# Patient Record
Sex: Female | Born: 1955 | Race: Black or African American | Hispanic: No | Marital: Single | Smoking: Never smoker
Health system: Southern US, Community
[De-identification: ages and names within clinical notes are randomized; demographics above are authoritative.]

## PROBLEM LIST (undated history)

## (undated) DIAGNOSIS — D649 Anemia, unspecified: Secondary | ICD-10-CM

## (undated) DIAGNOSIS — I10 Essential (primary) hypertension: Secondary | ICD-10-CM

## (undated) DIAGNOSIS — F419 Anxiety disorder, unspecified: Secondary | ICD-10-CM

## (undated) DIAGNOSIS — R002 Palpitations: Secondary | ICD-10-CM

## (undated) DIAGNOSIS — G43909 Migraine, unspecified, not intractable, without status migrainosus: Secondary | ICD-10-CM

## (undated) DIAGNOSIS — T7840XA Allergy, unspecified, initial encounter: Secondary | ICD-10-CM

## (undated) DIAGNOSIS — L309 Dermatitis, unspecified: Secondary | ICD-10-CM

## (undated) DIAGNOSIS — Z9289 Personal history of other medical treatment: Secondary | ICD-10-CM

## (undated) HISTORY — DX: Palpitations: R00.2

## (undated) HISTORY — DX: Allergy, unspecified, initial encounter: T78.40XA

## (undated) HISTORY — DX: Migraine, unspecified, not intractable, without status migrainosus: G43.909

## (undated) HISTORY — DX: Essential (primary) hypertension: I10

## (undated) HISTORY — PX: NASAL SINUS SURGERY: SHX719

## (undated) HISTORY — DX: Personal history of other medical treatment: Z92.89

## (undated) HISTORY — DX: Anemia, unspecified: D64.9

## (undated) HISTORY — DX: Anxiety disorder, unspecified: F41.9

## (undated) HISTORY — DX: Dermatitis, unspecified: L30.9

---

## 1978-04-18 HISTORY — PX: ABDOMINAL EXPLORATION SURGERY: SHX538

## 1997-12-11 ENCOUNTER — Other Ambulatory Visit: Admission: RE | Admit: 1997-12-11 | Discharge: 1997-12-11 | Payer: Self-pay | Admitting: Obstetrics

## 1997-12-26 ENCOUNTER — Ambulatory Visit (HOSPITAL_COMMUNITY): Admission: RE | Admit: 1997-12-26 | Discharge: 1997-12-26 | Payer: Self-pay | Admitting: Obstetrics

## 1999-11-05 ENCOUNTER — Encounter: Payer: Self-pay | Admitting: Cardiology

## 1999-11-05 ENCOUNTER — Encounter: Admission: RE | Admit: 1999-11-05 | Discharge: 1999-11-05 | Payer: Self-pay | Admitting: Cardiology

## 2000-03-30 ENCOUNTER — Encounter (INDEPENDENT_AMBULATORY_CARE_PROVIDER_SITE_OTHER): Payer: Self-pay | Admitting: *Deleted

## 2000-03-30 ENCOUNTER — Ambulatory Visit (HOSPITAL_BASED_OUTPATIENT_CLINIC_OR_DEPARTMENT_OTHER): Admission: RE | Admit: 2000-03-30 | Discharge: 2000-03-30 | Payer: Self-pay | Admitting: *Deleted

## 2002-07-24 ENCOUNTER — Encounter (INDEPENDENT_AMBULATORY_CARE_PROVIDER_SITE_OTHER): Payer: Self-pay | Admitting: *Deleted

## 2002-07-24 ENCOUNTER — Ambulatory Visit (HOSPITAL_COMMUNITY): Admission: RE | Admit: 2002-07-24 | Discharge: 2002-07-24 | Payer: Self-pay | Admitting: Obstetrics and Gynecology

## 2002-10-02 ENCOUNTER — Other Ambulatory Visit: Admission: RE | Admit: 2002-10-02 | Discharge: 2002-10-02 | Payer: Self-pay | Admitting: Obstetrics and Gynecology

## 2002-12-26 ENCOUNTER — Encounter: Admission: RE | Admit: 2002-12-26 | Discharge: 2002-12-26 | Payer: Self-pay | Admitting: Allergy and Immunology

## 2003-11-20 ENCOUNTER — Ambulatory Visit (HOSPITAL_COMMUNITY): Admission: RE | Admit: 2003-11-20 | Discharge: 2003-11-20 | Payer: Self-pay | Admitting: Obstetrics and Gynecology

## 2004-06-30 ENCOUNTER — Emergency Department (HOSPITAL_COMMUNITY): Admission: EM | Admit: 2004-06-30 | Discharge: 2004-07-01 | Payer: Self-pay | Admitting: Emergency Medicine

## 2005-03-28 ENCOUNTER — Encounter: Admission: RE | Admit: 2005-03-28 | Discharge: 2005-03-28 | Payer: Self-pay | Admitting: Emergency Medicine

## 2006-04-10 ENCOUNTER — Emergency Department (HOSPITAL_COMMUNITY): Admission: EM | Admit: 2006-04-10 | Discharge: 2006-04-10 | Payer: Self-pay | Admitting: Emergency Medicine

## 2006-11-29 ENCOUNTER — Encounter: Admission: RE | Admit: 2006-11-29 | Discharge: 2006-11-29 | Payer: Self-pay | Admitting: Emergency Medicine

## 2007-06-07 ENCOUNTER — Ambulatory Visit (HOSPITAL_BASED_OUTPATIENT_CLINIC_OR_DEPARTMENT_OTHER): Admission: RE | Admit: 2007-06-07 | Discharge: 2007-06-07 | Payer: Self-pay | Admitting: Cardiology

## 2007-06-16 ENCOUNTER — Ambulatory Visit: Payer: Self-pay | Admitting: Internal Medicine

## 2007-08-22 ENCOUNTER — Encounter: Admission: RE | Admit: 2007-08-22 | Discharge: 2007-08-23 | Payer: Self-pay | Admitting: Family Medicine

## 2007-08-22 ENCOUNTER — Ambulatory Visit: Payer: Self-pay | Admitting: Psychology

## 2007-12-10 IMAGING — CR DG CHEST 2V
2 series · 2 of 2 positions shown · non-contrast
Comparison: None.

CLINICAL DATA: Cough, weakness, and syncope.  
 CHEST - 2 VIEW: 
 PA and lateral chest - 04/10/06.

[view not recorded (1 of 2)]
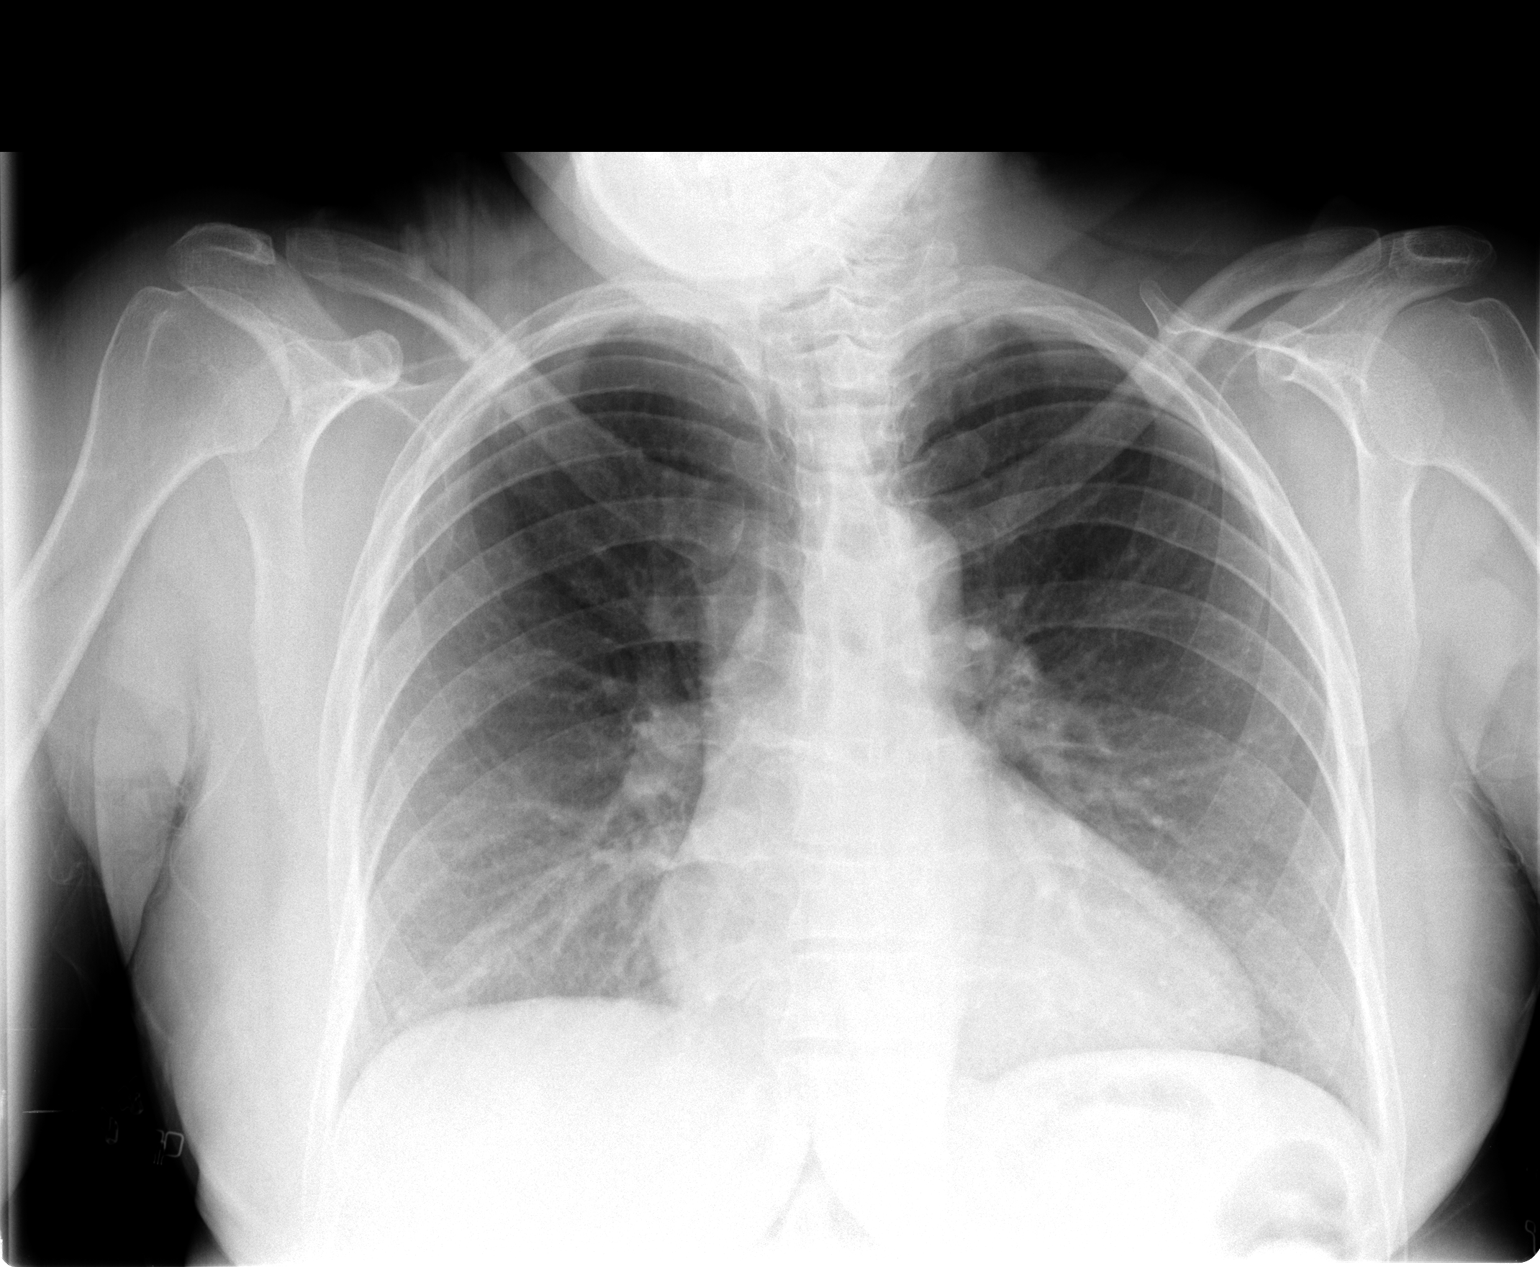

[view not recorded (2 of 2)]
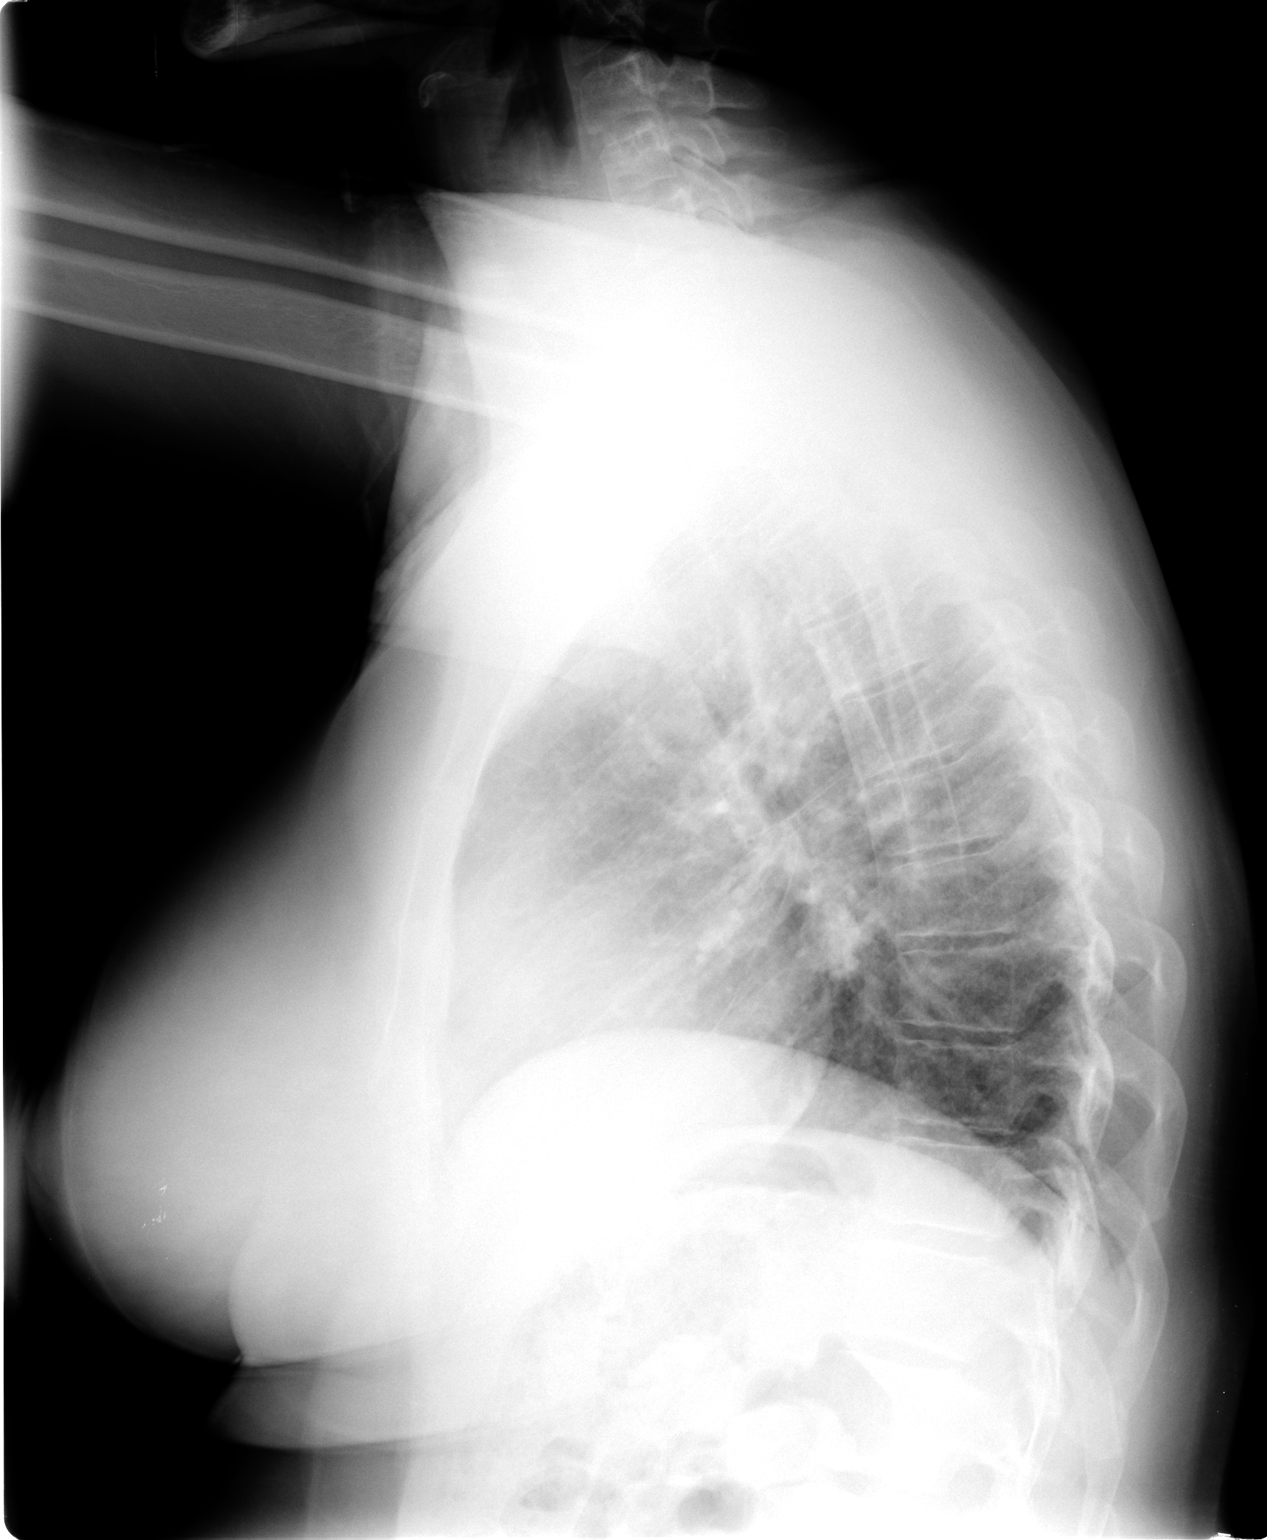

[2 of 2 positions shown; findings below may reference images not displayed]

FINDINGS: Lung volumes are low but the lungs are clear.  No effusion.  The heart size is upper normal.
IMPRESSION: No acute disease.

## 2009-05-13 ENCOUNTER — Encounter: Admission: RE | Admit: 2009-05-13 | Discharge: 2009-05-13 | Payer: Self-pay | Admitting: Obstetrics and Gynecology

## 2009-09-30 ENCOUNTER — Emergency Department (HOSPITAL_COMMUNITY): Admission: EM | Admit: 2009-09-30 | Discharge: 2009-09-30 | Payer: Self-pay | Admitting: Family Medicine

## 2010-04-18 HISTORY — PX: CARDIAC CATHETERIZATION: SHX172

## 2010-05-04 ENCOUNTER — Encounter: Admission: RE | Admit: 2010-05-04 | Payer: Self-pay | Source: Home / Self Care | Admitting: Cardiology

## 2010-05-07 ENCOUNTER — Ambulatory Visit (HOSPITAL_COMMUNITY)
Admission: RE | Admit: 2010-05-07 | Discharge: 2010-05-07 | Disposition: A | Discharge status: Still In Hospital | Payer: Self-pay | Source: Home / Self Care | Attending: Cardiology | Admitting: Cardiology

## 2010-05-09 ENCOUNTER — Encounter: Payer: Self-pay | Admitting: Obstetrics and Gynecology

## 2010-05-09 ENCOUNTER — Encounter: Payer: Self-pay | Admitting: Cardiology

## 2010-05-09 NOTE — Procedures (Signed)
Bianca [name], Bianca [name] NO.:  [id number]  MEDICAL RECORD NO.:  [id number]          PATIENT TYPE:  OIB  LOCATION:  6522                         FACILITY:  [facility]  PHYSICIAN:  Landry Corporal, MD DATE OF BIRTH:  1955-11-21  DATE OF PROCEDURE:  05/07/2010 DATE OF DISCHARGE:  05/07/2010                           CARDIAC CATHETERIZATION  PRIMARY CARE PROVIDER:  Dr. Ronne Binning  PERFORMING PHYSICIAN:  Landry Corporal, MD  PROCEDURE PERFORMED: 1. Left heart catheterization. 2. Left ventriculography in RAO Projection 3. Selective coronary angiography.  INDICATIONS: 1. Chest pain and shortness of breath. 2. Abnormal exercise treadmill stress test with diffuse ST depressions     at end-point of exercise.  [facility]  score estimated at negative 8 to     negative 12.  BRIEF HISTORY:  Ms. Sinning is a 55 year old African American woman with history of hypertension and anxiety who was referred for exercise treadmill stress test by Dr. Ronne Binning at [facility].  After reviewing the stress test, the patient was seen the same day in consultation based on the abnormal stress test.  After reviewing the stress test and the patient's symptoms, we discussed the risks, benefits, alternatives, and indications of additional stress test with imaging versus proceeding the cardiac catheterization.  After discussion, the patient was made to proceed with cardiac catheterization as this would be the definitive study.  The procedure with the risks, benefits, alternatives, and indications were discussed.  The potential complications were discussed.  All questions were answered.  The patient voiced understanding and agreed to proceed with the procedure.  PROCEDURE:  The patient was brought from the short stay area after premedication with prednisone and Benadryl.  She was brought to second floor of [facility] in a fasting state.   A time-out period was performed.  The plan was to go from left radial access and therefore a modified [name]'s/Barbeau test was performed on the left wrist, showed adequate lateral circulation to the left hand. The patient was fully draped with the left wrist prepped.  A time-out period was performed and then the patient was sedated with intravenous Versed and fentanyl.  The left wrist was anesthetized with 1% subcutaneous lidocaine and the left radial artery was accessed using via the Seldinger technique with placement of a 5-French sheath.  The sheath was then aspirated and flushed with total of 10 mL of standard radial cocktail infiltrated to the sheath.  Then first a 5-French JR-4 followed by 5-French JL-4 catheter were advanced over the wire and multiple angiographic views of the left and right coronary artery were obtained. The JL-3.5 catheter was exchanged for a 5-French pigtail catheter, which was then advanced across the aortic valve measuring the left ventricular hemodynamics.  A left ventriculogram was then performed in the RAO projection with 10 mL of contrast for 20 seconds.  After performing the left ventriculography, the catheter was pulled back across the aortic valve for measurement of the pullback gradient.  The catheter was then removed completely out of the body over a wire without any complications and the sheath was removed in the  cardiac catheterization lab with placement of a TR band with adequate hemostasis and assuring patent hemostasis.  The patient was then transferred to the holding area for ongoing care in a stable condition.  The patient was stable before, during, and after the procedure.  CATH LAB DATA: 1. Sedation:  1 mg of IV Versed, 25 mcg of IV fentanyl.  The patient     was also premedicated with 5 mg of p.o. Valium prior to the     procedure. 2. Contrast:  90 mL. 3. Radial cocktail 10 mL: 1% lidocaine, 400 mcg of nitroglycerin, and     5 mg of  verapamil. 4. 100 units of heparin bolus was administered intravenously at the     time of the radial access.  HEMODYNAMICS:  Left ventricular pressure is 110/12 mmHg.  LVEDP of 20 mmHg.  Aortic pressure is 109/66 mmHg with a mean of 86 mmHg.  ANGIOGRAPHIC FINDINGS: 1. The right coronary artery was a large-caliber vessel dominant     giving rise to a first large RV marginal branch and then bifurcates     distally to the PDA and posterolateral branch.  There is no     significant disease in these vessels.  A large-caliber vessel     almost down to the distal vessels. 2. The left main is a large-caliber vessel but bifurcation to LAD and     circumflex.  There is no significant disease in this vessel. 3. The LAD is a large-caliber vessel, which reaches down to around the     apex.  It gives rise to diagonal branches and the entire system has     no significant disease. 4. Circumflex artery is moderately large caliber vessel giving rise to     obtuse marginal branches and a small AV groove branch.  There is no     significant disease in this vessel either. 5. Left ventriculography showed hyperdynamic contractility with EF of     65-70% with no wall motion abnormalities.  There was no aortic     valve gradient on pullback.  Fluoroscopically, the heart borders     did appear somewhat thickened concerning for possible LVH.  IMPRESSION: 1. No significant coronary artery disease, likely nonanginal chest     pain with a false positive stress test. 2. Hyperdynamic left ventricle with slightly elevated LVEDP and wall     thickness suggestive of possible LVH.  RECOMMENDATIONS:  The patient will likely need an outpatient echocardiogram and follow up with me in roughly 1 weeks' time. Otherwise, he will have a standard postradial care and likely will be discharged later on the day.  Continue her other home medications.  The results will be discussed with the patient's primary care provider when  able to be contacted via telephone.          ______________________________ Landry Corporal, MD     DWH/MEDQ  D:  05/07/2010  T:  05/08/2010  Job:  [id number]  cc:   Second Floor [facility]; Dr. Ronne Binning; [facility]  Electronically Signed by Bryan Lemma MD on 05/09/2010 07:40:58 PM

## 2010-08-31 NOTE — Procedures (Signed)
Bianca [name], Bianca [name]               ACCOUNT NO.:  [id number]   MEDICAL RECORD NO.:  [id number]          PATIENT TYPE:  OUT   LOCATION:  SLEEP CENTER                 FACILITY:  [facility]   PHYSICIAN:  Clinton D. Maple Hudson, MD, FCCP, FACPDATE OF BIRTH:  03/30/[birth year]   DATE OF STUDY:  06/07/2007                            NOCTURNAL POLYSOMNOGRAM   REFERRING PHYSICIAN:  Osvaldo Shipper. Spruill, M.D.   REFERRING PHYSICIAN:  Osvaldo Shipper. Spruill, M.D.   INDICATION FOR STUDY:  Insomnia with sleep apnea.   EPWORTH SLEEPINESS SCORE:  17/24.  BMI 28.7.  Weight 157 pounds.  Height  62 inches.  Neck 13 inches.   HOME MEDICATIONS:  Charted and reviewed.   SLEEP ARCHITECTURE:  Total sleep time 339 minutes with sleep efficiency  81.8%.  Stage 1 is 4.6%, stage 2 67.6%, stage 3 5.2%, REM 22.7% of total  sleep time.  Sleep latency 16.5 minutes, REM latency 108.5 minutes.  Arousal awake after sleep onset 63 minutes.  Arousal index 13.3.  Bedtime medication included clonidine and triamterine/HCTZ.   RESPIRATORY DATA:  Apnea hypopnea index (AHI) of 0.5 events per hour  which is normal.  Respiratory disturbance index (RDI) was also normal at  3.4.  Normal range 0-5.  There were a total of 3 respiratory events, all  hypopneas recorded while sleeping on sides.   OXYGEN DATA:  Mild snoring with oxygen desaturation to a nadir of 91%.  Mean oxygen through the study was 97.7% on room air.   CARDIAC DATA:  Normal sinus rhythm.   MOVEMENT-PARASOMNIA:  No movement disturbances.  Bathroom x1.   IMPRESSIONS-RECOMMENDATIONS:  1. Sleep architecture not remarkable for sleep center environment with      a few transient awakenings but nothing specific.  2. No significant respiratory disturbance, AHI 0.5 per hour with a few      hypopneas recorded while lying on sides.  AHI 0.5 per hour (normal      range 0-5 per hour).      Clinton D. Maple Hudson, MD, [facility], FACP  Diplomate, Biomedical engineer of Sleep Medicine  Electronically  Signed     CDY/MEDQ  D:  06/16/2007 15:21:49  T:  06/17/2007 16:19:13  Job:  [id number]

## 2010-09-03 NOTE — Op Note (Signed)
NAME:  Bianca [name], Bianca [name]                         ACCOUNT NO.:  [id number]   MEDICAL RECORD NO.:  [id number]                   PATIENT TYPE:  AMB   LOCATION:  SDC                                  FACILITY:  [facility]   PHYSICIAN:  Sherry A. Rosalio Macadamia, M.D.           DATE OF BIRTH:  05/12/1955   DATE OF PROCEDURE:  07/24/2002  DATE OF DISCHARGE:                                 OPERATIVE REPORT   PREOPERATIVE DIAGNOSES:  1. Left Skene's abscess.  2. Right thigh nodule.   POSTOPERATIVE DIAGNOSES:  1. Left Skene's abscess.  2. Right thigh nodule.   PROCEDURES:  1. Excision, right thigh nodule.  2. Marsupialization, left Skene's gland.   SURGEON:  Sherry A. Rosalio Macadamia, M.D.   ANESTHESIA:  Spinal.   INDICATIONS FOR PROCEDURE:  This is a 55 year old G0, P0, woman who has  noted enlargement of the left labia that has been present for 20 years but  has gotten significantly bigger recently.  This caused significant  discomfort.  She had no drainage from this area.  The patient was also  complaining of an enlarging right thigh mole which she would like to have  excised.  Because of these two problems, the patient is brought to the  operating room for drainage of the left Skene's gland abscess and  marsupialization and excision of thigh nodule.   FINDINGS:  A 1-cm right thigh mole.  A 2-4 cm left Skene's gland abscess.   PROCEDURE:  The patient was brought into the operating room, given adequate  spinal anesthesia.  She was placed in dorsal lithotomy position.  Her  perineum, thigh, and vagina were washed with Hibiclens.  The patient was  draped in a sterile fashion.  The base of the right thigh mole was  infiltrated with 0.5% Marcaine with epinephrine.  The mole was excised.  Some little deeper tissue was removed to be able to close the skin properly.  Bleeders were cauterized.  Interrupted subcutaneous stitches were taken with  3-0 chromic to bring skin edges together.  Skin edges were  then closed with  4-0 Monocryl in a running subcuticular stitch.  Attention was then turned to  the [facility] gland.   The superficial labial skin was infiltrated with 0.5% Marcaine with  epinephrine.  An elliptical incision was made.  The exterior tissue was  excised.  The abscess was incised in the midline.  Drainage was obtained.  Large amount of thick, brown material was removed.  Edges of the incision  were cauterized using 3-0 chromic in interrupted stitches.  The deep tissue  was closed to the outer mucosal edge.  There was a small bleeder at the base  of this incision.  A mattress-type stitch was taken with 3-0 chromic with  adequate hemostasis present.  The edges were then reinfiltrated  with 0.5% Marcaine with epinephrine.  Adequate hemostasis was still present.  The patient was washed off.  The patient was taken out of the dorsal  lithotomy position, she was awakened, she was moved from the operating table  to a stretcher in stable condition.  Complications were none.  Estimated  blood loss 5 mL.                                               Sherry A. Rosalio Macadamia, M.D.    SAD/MEDQ  D:  07/24/2002  T:  07/24/2002  Job:  [id number]

## 2010-09-03 NOTE — Op Note (Signed)
[place]  Patient:    Bianca [name], Bianca [name]                      MRN: [id number] Proc. Date: 03/30/00 Adm. Date:  [id number] Attending:  Claudina Lick                           Operative Report  PREOPERATIVE DIAGNOSES: 1. Bilateral recurrent sinusitis. 2. Deviated nasal septum. 3. Nasal turbinate hypertrophy.  POSTOPERATIVE DIAGNOSES: 1. Bilateral recurrent sinusitis. 2. Deviated nasal septum. 3. Nasal turbinate hypertrophy.  OPERATION: 1. Bilateral endoscopic anterior ethmoidectomy. 2. Bilateral endoscopic maxillary antrostomies. 3. Nasal septoplasty. 4. Submucous resection right inferior nasal turbinate.  SURGEON:  Robert L. Lyman Bishop, M.D.  ANESTHESIA:  General.  INDICATION FOR PROCEDURE:  This 55 year old black female has had a chronic recurring history of sinusitis, multiple episodes treated by her internist and allergist with multiple courses of antibiotics and steroid nasal inhalers. The patient has had persistent nasal blockage, headaches and an examination had a septal deviation particularly superiorly more marked to the left and large right middle and inferior turbinates.  CT scan of sinuses within normal limits. The patient admitted for surgery.  DESCRIPTION OF PROCEDURE:  After satisfactory general endotracheal anesthesia had been induced, topical epinephrine packs were placed intranasally after which the nasal septum, both inferior and middle turbinates and the anterior lateral nasal wall were infiltrated with 1% xylocaine containing 1:100,000 epinephrine for hemostasis using a total of 9 cc.  The nose and face were then prepped with Betadine and sterile drapes applied.  A left anterior septal incision was made.  The mucoperichondrium and periosteum elevated on the left side.  The quadrangular cartilage was then separated from the perpendicular plate and the mucoperiosteal flap on the right side was then elevated.   The deviated portion of the perpendicular plate superiorly along with a small volmerine spur inferiorly was then removed preserving the guadrangular cartilage.  This corrected the high septal deviation and opened up the superior nasal vault on each side.  The posterior septal flaps were then reapproximated with a mattress suture of 5-0 Vicryl and the septal incision was closed with running 5-0 chromic gut.  A small incision was made over the anterior end of the right inferior nasal turbinate.  The mucoperiosteum elevated.  Some excess turbinate bone removed and the incision closed with running 5-0 chromic gut.  Incision was made along the uncinate process with a sickle knife on each side and using the 0 degree endoscope and the microdebrider an anterior ethmoidectomy on each side was carried out removing some thickened but not polypoid mucosa and a large maxillary antrostomy on each side was then created.  The maxillary sinus mucosa on each side is visualized through the enlarged antrostomy was somewhat thickened but no polyps were present.  A small strip of adaptic gauze impregnated with Cortisporin ointment was placed in each ethmoid cavity and each side of the nose was then packed with a folded strip of Telfa gauze coated with Cortisporin ointment.  Estimated blood loss was less than 10 cc.  The patient tolerated the procedure well and was given 1 gm of Ancef IV intraoperatively. She was awakened from anesthesia and taken to the recovery room in satisfactory condition. DD:  03/30/00 TD:  03/30/00 Job: [id number]

## 2010-09-06 ENCOUNTER — Other Ambulatory Visit: Payer: Self-pay | Admitting: Obstetrics and Gynecology

## 2010-12-21 ENCOUNTER — Other Ambulatory Visit: Payer: Self-pay | Admitting: Obstetrics and Gynecology

## 2010-12-21 DIAGNOSIS — Z1231 Encounter for screening mammogram for malignant neoplasm of breast: Secondary | ICD-10-CM

## 2010-12-22 ENCOUNTER — Ambulatory Visit
Admission: RE | Admit: 2010-12-22 | Discharge: 2010-12-22 | Disposition: A | Discharge status: Home / Self Care | Payer: BC Managed Care – PPO | Source: Ambulatory Visit | Attending: Obstetrics and Gynecology | Admitting: Obstetrics and Gynecology

## 2010-12-22 DIAGNOSIS — Z1231 Encounter for screening mammogram for malignant neoplasm of breast: Secondary | ICD-10-CM

## 2010-12-24 ENCOUNTER — Other Ambulatory Visit: Payer: Self-pay | Admitting: Obstetrics and Gynecology

## 2010-12-24 DIAGNOSIS — N644 Mastodynia: Secondary | ICD-10-CM

## 2011-01-03 ENCOUNTER — Other Ambulatory Visit: Payer: Self-pay | Admitting: Obstetrics and Gynecology

## 2011-01-03 ENCOUNTER — Ambulatory Visit
Admission: RE | Admit: 2011-01-03 | Discharge: 2011-01-03 | Disposition: A | Discharge status: Home / Self Care | Payer: BC Managed Care – PPO | Source: Ambulatory Visit | Attending: Obstetrics and Gynecology | Admitting: Obstetrics and Gynecology

## 2011-01-03 DIAGNOSIS — N644 Mastodynia: Secondary | ICD-10-CM

## 2011-03-15 ENCOUNTER — Other Ambulatory Visit: Payer: Self-pay | Admitting: Obstetrics and Gynecology

## 2011-03-15 DIAGNOSIS — N644 Mastodynia: Secondary | ICD-10-CM

## 2011-04-02 ENCOUNTER — Ambulatory Visit (INDEPENDENT_AMBULATORY_CARE_PROVIDER_SITE_OTHER): Payer: BC Managed Care – PPO

## 2011-04-02 DIAGNOSIS — H1030 Unspecified acute conjunctivitis, unspecified eye: Secondary | ICD-10-CM

## 2011-04-02 DIAGNOSIS — H1130 Conjunctival hemorrhage, unspecified eye: Secondary | ICD-10-CM

## 2011-04-09 ENCOUNTER — Ambulatory Visit (INDEPENDENT_AMBULATORY_CARE_PROVIDER_SITE_OTHER): Payer: BC Managed Care – PPO

## 2011-04-09 DIAGNOSIS — J019 Acute sinusitis, unspecified: Secondary | ICD-10-CM

## 2011-04-09 DIAGNOSIS — J189 Pneumonia, unspecified organism: Secondary | ICD-10-CM

## 2011-06-24 ENCOUNTER — Ambulatory Visit: Payer: BC Managed Care – PPO

## 2011-06-24 ENCOUNTER — Ambulatory Visit (INDEPENDENT_AMBULATORY_CARE_PROVIDER_SITE_OTHER): Payer: BC Managed Care – PPO | Admitting: Family Medicine

## 2011-06-24 VITALS — BP 130/70 | HR 60 | Temp 97.7°F | Resp 18 | Ht 61.0 in | Wt 161.0 lb

## 2011-06-24 DIAGNOSIS — M79669 Pain in unspecified lower leg: Secondary | ICD-10-CM

## 2011-06-24 DIAGNOSIS — R252 Cramp and spasm: Secondary | ICD-10-CM

## 2011-06-24 NOTE — Progress Notes (Signed)
[facility]:  Office Visit  Chief Complaint:  Chief Complaint  Patient presents with  . Leg Pain    left  . pressure in left arm    HPI: Bianca [name] is a 56 y.o. female who complains of  Left leg cramp x today after taking Excedrin migraine. Described as "charley horse like" , constant, worsens with certain movement ie extension.  Cramps  Noted in her left posterior leg, behind knee. NO radiation. Unable to extend as well, some discomfort with flexion. Denies SOB, swelling, redness, signs of infection, warmth, fever, chills. Denies trauma or infection.   Denies risk factors for DVT: no recent travels, surgeries, traumas, OCP use, prior DVT/blood clot disorders, malignancies.  She is on HCTZ for HTN. H/o anemia.   Past Medical History  Diagnosis Date  . Hypertension   . Migraines   . Allergy   . Anemia   . Asthma   . Anxiety   . Palpitations    History reviewed. No pertinent past surgical history. History   Social History  . Marital Status: Single    Spouse Name: N/A    Number of Children: N/A  . Years of Education: N/A   Social History Main Topics  . Smoking status: Never Smoker   . Smokeless tobacco: None  . Alcohol Use: No  . Drug Use: No  . Sexually Active: None   Other Topics Concern  . None   Social History Narrative  . None   Family History  Problem Relation Age of Onset  . Hypertension Mother   . Hypertension Father   . Stroke Maternal Grandmother    Allergies  Allergen Reactions  . Amoxicillin   . Sulfur    Prior to Admission medications   Medication Sig Start Date End Date Taking? Authorizing Provider  metoprolol succinate (TOPROL-XL) 50 MG 24 hr tablet Take 5 mg by mouth daily. Take with or immediately following a meal.   Yes Historical Provider, MD  triamterene-hydrochlorothiazide (MAXZIDE-25) 37.5-25 MG per tablet Take 1 tablet by mouth daily.   Yes Historical Provider, MD     ROS: The patient denies fevers,  chills, night sweats, unintentional weight loss, chest pain, palpitations, wheezing, dyspnea on exertion, nausea, vomiting, abdominal pain, dysuria, hematuria, melena, numbness, weakness, or tingling.   All other systems have been reviewed and were otherwise negative with the exception of those mentioned in the HPI and as above.    PHYSICAL EXAM: Filed Vitals:   06/24/11 1632  BP: 130/70  Pulse: 60  Temp: 97.7 F (36.5 C)  Resp: 18   Filed Vitals:   06/24/11 1632  Height: 5\' 1"  (1.549 m)  Weight: 161 lb (73.029 kg)   Body mass index is 30.42 kg/(m^2).  General: Alert, no acute distress HEENT:  Normocephalic, atraumatic, oropharynx patent.  Cardiovascular:  Regular rate and rhythm, no rubs murmurs or gallops.  No Carotid bruits, radial pulse intact. No pedal edema.  Respiratory: Clear to auscultation bilaterally.  No wheezes, rales, or rhonchi.  No cyanosis, no use of accessory musculature GI: No organomegaly, abdomen is soft and non-tender, positive bowel sounds.  No masses. Skin: No rashes. Neurologic: Facial musculature symmetric. Psychiatric: Patient is appropriate throughout our interaction. Lymphatic: No cervical lymphadenopathy Musculoskeletal: Gait left limp Hips: normal Left knee: + mild swelling posterior knee;  negative Lachman, McMurrays/jt line tenderness, MCl, LCL tenderness/pain; patient has minimal decrease extension of knee. And minimal decrease in flexion. NO calf swelling, no varicose veins.  Dorsalis and post tib pulses +2 5/5 strength, sensation intact, able to dorsi and plantar flex foot.   LABS: No results found for this or any previous visit.   EKG/XRAY:   Primary read interpreted by Dr. Conley Rolls at  [facility]. Left posterior knee effusion.    ASSESSMENT/PLAN: Encounter Diagnoses  Name Primary?  . Calf pain Yes  . Leg cramps    1. Xray shows posterior knee effusion-? Etiology? No trauma or s/sx of infection. Will monitor, sxs treatment, NSAIDs and warm  compresses. Elevate. 2. Check BMP for ? Electrolyte issues.  3. F/u prn.    Tammye Kahler PHUONG, DO 06/24/2011 7:02 PM

## 2011-06-25 LAB — BASIC METABOLIC PANEL
Calcium: 10 mg/dL (ref 8.4–10.5)
Potassium: 4.1 mEq/L (ref 3.5–5.3)
Sodium: 144 mEq/L (ref 135–145)

## 2011-06-25 LAB — BASIC METABOLIC PANEL WITH GFR
BUN: 9 mg/dL (ref 6–23)
CO2: 31 meq/L (ref 19–32)
Chloride: 104 meq/L (ref 96–112)
Creat: 0.71 mg/dL (ref 0.50–1.10)
Glucose, Bld: 78 mg/dL (ref 70–99)

## 2011-06-30 ENCOUNTER — Telehealth: Payer: Self-pay | Admitting: Family Medicine

## 2011-06-30 NOTE — Telephone Encounter (Signed)
Lm regarding xray results and also labs. Minimal DJD of knee and also normal BMP. F/u prn

## 2011-11-30 DIAGNOSIS — R002 Palpitations: Secondary | ICD-10-CM | POA: Insufficient documentation

## 2011-12-24 ENCOUNTER — Ambulatory Visit (INDEPENDENT_AMBULATORY_CARE_PROVIDER_SITE_OTHER): Payer: BC Managed Care – PPO | Admitting: Family Medicine

## 2011-12-24 VITALS — BP 103/71 | HR 82 | Temp 98.4°F | Resp 16 | Ht 62.0 in | Wt 150.0 lb

## 2011-12-24 DIAGNOSIS — I10 Essential (primary) hypertension: Secondary | ICD-10-CM

## 2011-12-24 DIAGNOSIS — R002 Palpitations: Secondary | ICD-10-CM

## 2011-12-24 DIAGNOSIS — R079 Chest pain, unspecified: Secondary | ICD-10-CM

## 2011-12-24 DIAGNOSIS — F411 Generalized anxiety disorder: Secondary | ICD-10-CM

## 2011-12-24 DIAGNOSIS — F419 Anxiety disorder, unspecified: Secondary | ICD-10-CM

## 2011-12-24 LAB — POCT CBC
Lymph, poc: 2.8 (ref 0.6–3.4)
MCH, POC: 28.1 pg (ref 27–31.2)
MCHC: 31.2 g/dL — AB (ref 31.8–35.4)
MCV: 90.2 fL (ref 80–97)
MID (cbc): 0.6 (ref 0–0.9)
POC LYMPH PERCENT: 30.9 %L (ref 10–50)
Platelet Count, POC: 419 10*3/uL (ref 142–424)
RBC: 4.69 M/uL (ref 4.04–5.48)
WBC: 8.9 10*3/uL (ref 4.6–10.2)

## 2011-12-24 LAB — COMPREHENSIVE METABOLIC PANEL
AST: 25 U/L (ref 0–37)
BUN: 10 mg/dL (ref 6–23)
Calcium: 10.6 mg/dL — ABNORMAL HIGH (ref 8.4–10.5)
Chloride: 102 mEq/L (ref 96–112)
Creat: 0.83 mg/dL (ref 0.50–1.10)
Total Bilirubin: 1.1 mg/dL (ref 0.3–1.2)

## 2011-12-24 LAB — TSH: TSH: 0.52 u[IU]/mL (ref 0.350–4.500)

## 2011-12-24 MED ORDER — LORAZEPAM 1 MG PO TABS: ORAL_TABLET | ORAL | Status: DC

## 2011-12-24 MED ORDER — BUPROPION HCL ER (XL) 150 MG PO TB24: ORAL_TABLET | ORAL | Status: DC

## 2011-12-24 NOTE — Progress Notes (Signed)
Subjective: 56 year old female who is here complaining of headache, palpitations, chest pain. She is a Chartered loss adjuster. She has a prior history of palpitations and chest pains. Has been evaluated by a local cardiologist, Dr. Herbie Baltimore, who even did a heart catheterization on her last year. Her heart cath was cleaned. She does have a family history of heart rhythm problems in the family, with her brother having to be cardioverted for atrial fibrillation it sounds like. Since school started back over last week she's had a lot more palpitations. She has pains going into her left arm. She has headaches. She does not describe her sinuses being a big concern even though that was in the visit info note. 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 There have been administrative changes also which are on her. She is single and lives alone she has not been getting her regular size of late, though she used to exercise regularly on a treadmill. She takes Phillips. Was on one half of a pill daily but he after that last visit to a whole pill daily her pulse is up these days, though at times she gets pulses down into the 50s.  Complain more of her sinus on the way out. Mostly her symptoms are a frontal headache from that.  Objective: Anxious appearing lady who otherwise looks healthy. TMs normal. Throat clear. Neck supple without nodes. Chest clear. Heart regular without murmurs. No ectopy were noted when I was examining her. Abdomen nontender.  Assessment: Palpitations Chest pain to left arm Headache Anxiety  Plan: EKG CBC, complete metabolic panel, TSH   EKG no ectopy.  Right anterior hemiblock, nonspecific  Results for orders placed in visit on 12/24/11  POCT CBC      Component Value Range   WBC 8.9  4.6 - 10.2 K/uL   Lymph, poc 2.8  0.6 - 3.4   POC LYMPH PERCENT 30.9  10 - 50 %L   MID (cbc) 0.6  0 - 0.9   POC MID % 6.5  0 - 12 %M   POC Granulocyte 5.6  2 -  6.9   Granulocyte percent 62.6  37 - 80 %G   RBC 4.69  4.04 - 5.48 M/uL   Hemoglobin 13.2  12.2 - 16.2 g/dL   HCT, POC 96.0  45.4 - 47.9 %   MCV 90.2  80 - 97 fL   MCH, POC 28.1  27 - 31.2 pg   MCHC 31.2 (*) 31.8 - 35.4 g/dL   RDW, POC 09.8     Platelet Count, POC 419  142 - 424 K/uL   MPV 7.1  0 - 99.8 fL    Recommend counselling, medication, exercise.  Discouraged leave of absence yet.  She has a sinus nose spray at home I told her to use that and take Claritin.

## 2011-12-24 NOTE — Patient Instructions (Addendum)
Suggested psychologists/counsellors:  Karmen Bongo  [phone]  GNFAOZ HYQMVHQ et al [phone],  [phone]  Exercise  See your PCP  Welbutrin one daily  Lorazepram 1 mg Take 1/2 to 1 maximum twice daily for anxiety only as needed  Increase the metoprolol to 50 mg in AM and 25 mg (1/2 of 50) in PM

## 2011-12-26 ENCOUNTER — Encounter: Payer: Self-pay | Admitting: *Deleted

## 2012-05-25 ENCOUNTER — Other Ambulatory Visit: Payer: Self-pay | Admitting: Otolaryngology

## 2012-05-25 DIAGNOSIS — Z8489 Family history of other specified conditions: Secondary | ICD-10-CM

## 2012-05-25 DIAGNOSIS — G43909 Migraine, unspecified, not intractable, without status migrainosus: Secondary | ICD-10-CM

## 2012-05-28 ENCOUNTER — Ambulatory Visit
Admission: RE | Admit: 2012-05-28 | Discharge: 2012-05-28 | Disposition: A | Discharge status: Home / Self Care | Payer: BC Managed Care – PPO | Source: Ambulatory Visit | Attending: Otolaryngology | Admitting: Otolaryngology

## 2012-05-28 DIAGNOSIS — G43909 Migraine, unspecified, not intractable, without status migrainosus: Secondary | ICD-10-CM

## 2012-05-28 DIAGNOSIS — Z8489 Family history of other specified conditions: Secondary | ICD-10-CM

## 2012-05-28 MED ORDER — GADOBENATE DIMEGLUMINE 529 MG/ML IV SOLN
14.0000 mL | Freq: Once | INTRAVENOUS | Status: AC | PRN
  Administered 2012-05-28: 14 mL via INTRAVENOUS

## 2012-06-13 ENCOUNTER — Other Ambulatory Visit (HOSPITAL_COMMUNITY): Payer: Self-pay | Admitting: Cardiology

## 2012-06-13 DIAGNOSIS — R079 Chest pain, unspecified: Secondary | ICD-10-CM

## 2012-06-13 DIAGNOSIS — I10 Essential (primary) hypertension: Secondary | ICD-10-CM

## 2012-06-13 DIAGNOSIS — R001 Bradycardia, unspecified: Secondary | ICD-10-CM

## 2012-06-13 DIAGNOSIS — R42 Dizziness and giddiness: Secondary | ICD-10-CM

## 2012-07-05 ENCOUNTER — Encounter (HOSPITAL_COMMUNITY): Payer: BC Managed Care – PPO

## 2012-07-18 ENCOUNTER — Ambulatory Visit (HOSPITAL_COMMUNITY)
Admission: RE | Admit: 2012-07-18 | Discharge: 2012-07-18 | Disposition: A | Discharge status: Home / Self Care | Payer: BC Managed Care – PPO | Source: Ambulatory Visit | Attending: Cardiology | Admitting: Cardiology

## 2012-07-18 DIAGNOSIS — R42 Dizziness and giddiness: Secondary | ICD-10-CM | POA: Insufficient documentation

## 2012-07-18 DIAGNOSIS — I498 Other specified cardiac arrhythmias: Secondary | ICD-10-CM | POA: Insufficient documentation

## 2012-07-18 DIAGNOSIS — R079 Chest pain, unspecified: Secondary | ICD-10-CM | POA: Insufficient documentation

## 2012-07-18 DIAGNOSIS — R001 Bradycardia, unspecified: Secondary | ICD-10-CM

## 2012-07-18 DIAGNOSIS — I10 Essential (primary) hypertension: Secondary | ICD-10-CM | POA: Insufficient documentation

## 2012-07-18 NOTE — Progress Notes (Signed)
Carotid Duplex Completed. Bianca [name]  

## 2012-07-31 ENCOUNTER — Other Ambulatory Visit: Payer: Self-pay | Admitting: Internal Medicine

## 2012-07-31 DIAGNOSIS — E041 Nontoxic single thyroid nodule: Secondary | ICD-10-CM

## 2012-08-02 ENCOUNTER — Ambulatory Visit (HOSPITAL_COMMUNITY)
Admission: RE | Admit: 2012-08-02 | Discharge: 2012-08-02 | Disposition: A | Discharge status: Home / Self Care | Payer: BC Managed Care – PPO | Source: Ambulatory Visit | Attending: Internal Medicine | Admitting: Internal Medicine

## 2012-08-02 DIAGNOSIS — E041 Nontoxic single thyroid nodule: Secondary | ICD-10-CM | POA: Insufficient documentation

## 2012-08-08 ENCOUNTER — Other Ambulatory Visit: Payer: Self-pay | Admitting: Internal Medicine

## 2012-08-08 ENCOUNTER — Other Ambulatory Visit (HOSPITAL_COMMUNITY)
Admission: RE | Admit: 2012-08-08 | Discharge: 2012-08-08 | Disposition: A | Discharge status: Home / Self Care | Payer: BC Managed Care – PPO | Source: Ambulatory Visit | Attending: Interventional Radiology | Admitting: Interventional Radiology

## 2012-08-08 ENCOUNTER — Ambulatory Visit
Admission: RE | Admit: 2012-08-08 | Discharge: 2012-08-08 | Disposition: A | Discharge status: Home / Self Care | Payer: BC Managed Care – PPO | Source: Ambulatory Visit | Attending: Internal Medicine | Admitting: Internal Medicine

## 2012-08-08 DIAGNOSIS — E041 Nontoxic single thyroid nodule: Secondary | ICD-10-CM

## 2012-08-08 DIAGNOSIS — E049 Nontoxic goiter, unspecified: Secondary | ICD-10-CM | POA: Insufficient documentation

## 2012-09-05 ENCOUNTER — Other Ambulatory Visit: Payer: Self-pay | Admitting: Endocrinology

## 2012-09-05 DIAGNOSIS — E049 Nontoxic goiter, unspecified: Secondary | ICD-10-CM

## 2012-10-26 ENCOUNTER — Encounter: Payer: Self-pay | Admitting: *Deleted

## 2012-10-29 ENCOUNTER — Encounter: Payer: Self-pay | Admitting: Cardiology

## 2012-10-30 ENCOUNTER — Ambulatory Visit: Payer: BC Managed Care – PPO | Admitting: Cardiology

## 2013-01-17 ENCOUNTER — Other Ambulatory Visit (HOSPITAL_COMMUNITY): Payer: Self-pay | Admitting: Obstetrics and Gynecology

## 2013-01-17 DIAGNOSIS — Z1231 Encounter for screening mammogram for malignant neoplasm of breast: Secondary | ICD-10-CM

## 2013-01-18 ENCOUNTER — Ambulatory Visit (HOSPITAL_COMMUNITY)
Admission: RE | Admit: 2013-01-18 | Discharge: 2013-01-18 | Disposition: A | Discharge status: Home / Self Care | Payer: BC Managed Care – PPO | Source: Ambulatory Visit | Attending: Obstetrics and Gynecology | Admitting: Obstetrics and Gynecology

## 2013-01-18 DIAGNOSIS — Z1231 Encounter for screening mammogram for malignant neoplasm of breast: Secondary | ICD-10-CM | POA: Insufficient documentation

## 2013-02-14 ENCOUNTER — Ambulatory Visit (HOSPITAL_COMMUNITY)
Admission: RE | Admit: 2013-02-14 | Discharge: 2013-02-14 | Disposition: A | Discharge status: Home / Self Care | Payer: BC Managed Care – PPO | Source: Ambulatory Visit | Attending: Cardiovascular Disease | Admitting: Cardiovascular Disease

## 2013-02-14 DIAGNOSIS — I10 Essential (primary) hypertension: Secondary | ICD-10-CM

## 2013-02-14 DIAGNOSIS — R002 Palpitations: Secondary | ICD-10-CM

## 2013-02-22 ENCOUNTER — Encounter: Payer: Self-pay | Admitting: Cardiology

## 2013-02-22 ENCOUNTER — Ambulatory Visit: Payer: BC Managed Care – PPO | Admitting: Cardiology

## 2013-02-22 ENCOUNTER — Ambulatory Visit (INDEPENDENT_AMBULATORY_CARE_PROVIDER_SITE_OTHER): Payer: BC Managed Care – PPO | Admitting: Cardiology

## 2013-02-22 VITALS — BP 120/84 | HR 72 | Ht 61.5 in | Wt 160.8 lb

## 2013-02-22 DIAGNOSIS — R9439 Abnormal result of other cardiovascular function study: Secondary | ICD-10-CM

## 2013-02-22 DIAGNOSIS — R002 Palpitations: Secondary | ICD-10-CM

## 2013-02-22 DIAGNOSIS — I10 Essential (primary) hypertension: Secondary | ICD-10-CM

## 2013-02-22 NOTE — Patient Instructions (Signed)
Your physician has requested that you have en exercise stress myoview. For further information please visit [email or web address] Please follow instruction sheet, as given.   Your physician wants you to follow-up in 12 months Dr Herbie Baltimore if EXERCISE MYOVIEW is abnormal we will see you at that time.  You will receive a reminder letter in the mail two months in advance. If you don't receive a letter, please call our office to schedule the follow-up appointment.

## 2013-02-24 ENCOUNTER — Encounter: Payer: Self-pay | Admitting: Cardiology

## 2013-02-24 DIAGNOSIS — R9439 Abnormal result of other cardiovascular function study: Secondary | ICD-10-CM | POA: Insufficient documentation

## 2013-02-24 NOTE — Progress Notes (Signed)
PATIENT: Bianca [name] MRN: [id number]  DOB: [birth year]   DOV:02/24/2013 PCP: Thayer Headings, MD  Clinic Note: Chief Complaint  Patient presents with  . Follow-up    Discuss tests results. Pt reports dizziness, back pain, left leg pain. DOE. Denies c/p.    HPI: Bianca [name] is a 57 y.o. female with a PMH below who presents today for reconsultation for abnormal treadmill stress test.  Interval History: Bianca [name] is well-known to me, she has had a long-standing history of intermittent chest pain episodes. We actually evaluated this with a cardiac catheterization in January 2000 while which showed no significant coronary disease.  She is definitely had some musculoskeletal chest discomfort. She doesn't palpitations with intermittent PACs and a short pager on a monitor in the past. I last saw her in April of this year. She's doing relatively well. She still noted below but exertional dyspnea and off-and-on chest discomfort.  Because of her persistent chest discomfort, she was referred for a redo Treadmill Stress Test which was noted to be abnormal with significant ST depressions suggestive of ischemia. She now is here to discuss the results and further evaluation. She has this chest discomfort the comes and goes with either rest or exertion. She still is on exertion. She notes the discomfort in her chest is usually a sense of fullness in the center chest. She usually notes it in the morning. It is not necessarily associated with her exertional dyspnea. She does note some orthostatic symptoms, but denies any significant near-syncope or syncope. No significant issues with palpitations or rapid heart beats.  I last saw her she was still enjoying that if she was not having the stresses of working as a Runner, broadcasting/film/video, however she is now getting stressed with not having anything to do. 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  The remainder of Cardiovascular  ROS: negative for - loss of consciousness, murmur, orthopnea, palpitations, paroxysmal nocturnal dyspnea or rapid heart rate: Additional cardiac review of systems: Lightheadedness - yes, dizziness - yes, syncope/near-syncope - no; TIA/amaurosis fugax - no Melena - no, hematochezia no; hematuria - no; nosebleeds - no; claudication - no  Past Medical History  Diagnosis Date  . Hypertension   . Migraines   . Allergy   . Anemia   . Asthma   . Anxiety   . Palpitations   . Hx of echocardiogram 091/2012    relatively normal as well. No significant valvar lesions. Normal Ef.  . H/O exercise stress test 2008; October 2014    was normal in 2008; positive for ischemia with inferior and lateral ST depressions October 2014   Prior Cardiac Evaluation and Past Surgical History: Past Surgical History  Procedure Laterality Date  . Cardiac catheterization  04/2010    with no evidence of ischemia or significant coronary disease to speak of, normal LV function with relatively normal EDP.  Marland Kitchen Abdominal hysterectomy  1980  . Nasal sinus surgery      Allergies  Allergen Reactions  . Codeine Other (See Comments)    Pounding in head   . Bee Venom     Swelling  . Milk-Related Compounds     Diarrhea & gas  . Shellfish Allergy     HIVES, SWELLING, N/V & DIARREHA  . Sulfur     Current Outpatient Prescriptions  Medication Sig Dispense Refill  . acetaminophen (TYLENOL) 325 MG tablet Take 500 mg by mouth every 6 (six) hours as  needed.      Marland Kitchen amLODipine (NORVASC) 5 MG tablet Take 5 mg by mouth daily.      . cholecalciferol (VITAMIN D) 1000 UNITS tablet Take 1,000 Units by mouth. 3 x a week      . fexofenadine (ALLEGRA) 180 MG tablet Take 180 mg by mouth daily.      Marland Kitchen FLUoxetine (PROZAC) 20 MG tablet Take 20 mg by mouth daily.      Marland Kitchen LORazepam (ATIVAN) 1 MG tablet Take 1/2 to 1 maximum twice daily for anxiety only as needed  30 tablet  0  . Multiple Vitamins-Minerals (CENTRUM PO) Take 1 tablet by  mouth.      . Multiple Vitamins-Minerals (MULTIVITAMIN WITH IRON-MINERALS) liquid Take by mouth daily.      . ranitidine (ZANTAC) 75 MG tablet Take 75 mg by mouth daily.       No current facility-administered medications for this visit.    History   Social History Narrative   Single woman, who lives alone. 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   She does not smoke or drink alcohol.   She occasionally exercises walking on a treadmill.   ROS: A comprehensive Review of Systems - Negative except Pertinent symptoms as noted above Psychological ROS: positive for - anxiety; she also noted some pain in her left arm and left leg that is not necessarily associated with other symptoms.  PHYSICAL EXAM  BP 120/84  Pulse 72  Ht 5' 1.5" (1.562 m)  Wt 160 lb 12.8 oz (72.938 kg)  BMI 29.89 kg/m2 General appearance: alert, cooperative, appears stated age, no distress and Well-nourished and well-groomed; somewhat depressed mood as usual Neck: no adenopathy, no carotid bruit, no JVD and supple, symmetrical, trachea midline Lungs: clear to auscultation bilaterally, normal percussion bilaterally and Nonlabored, good air movement Heart: regular rate and rhythm, S1, S2 normal, no murmur, click, rub or gallop, normal apical impulse and Notable point tenderness along the costochondral margin from roughly the fourth rib down to the floating ribs. This is notably where her symptoms are and it reproduces her pain Abdomen: soft, non-tender; bowel sounds normal; no masses,  no organomegaly Extremities: extremities normal, atraumatic, no cyanosis or edema, no edema, redness or tenderness in the calves or thighs and no ulcers, gangrene or trophic changes Pulses: 2+ and symmetric Neurologic: Grossly normal  RUE:[id number] today: No  Recent Labs: None currently  ASSESSMENT / PLAN: Abnormal stress ECG with  treadmill Inserted or given the results of the Treadmill Stress Test which is a change from her previous treadmill result. Interestingly, the chest discomfort she is feeling is still consistent with costochondritis, however would be abnormal treadmill, this warrants further evaluation.  She is on a heart catheterization that was negative for any significant disease. We discussed the options of either redo catheterization or Treadmill Myoview. She would prefer to stick with a noninvasive approach. Plan: Exercise/Treadmill Myoview stress test with close followup if abnormal, will as will see back in one year.  Heart palpitations Well-controlled. Did not do well on beta blocker  HTN (hypertension) Well-controlled on amlodipine.   Orders Placed This Encounter  Procedures  . Myocardial Perfusion Imaging    Standing Status: Future     Number of Occurrences:      Standing Expiration Date: 02/22/2014    Scheduling Instructions:     SX ABN TREADMILL TEST;  Order Specific Question:  Where should this test be performed    Answer:  [facility]    Order Specific Question:  Type of stress    Answer:  Exercise    Order Specific Question:  Patient weight in lbs    Answer:  160    Followup: One month if abnormal Myoview, otherwise 12 months  DAVID W. Herbie Baltimore, M.D., M.S. [facility] [address] [place], [place]  [address]  [phone] Pager # [phone]

## 2013-02-24 NOTE — Assessment & Plan Note (Signed)
Well-controlled. Did not do well on beta blocker

## 2013-02-24 NOTE — Assessment & Plan Note (Signed)
Well controlled on amlodipine

## 2013-02-24 NOTE — Assessment & Plan Note (Signed)
Inserted or given the results of the Treadmill Stress Test which is a change from her previous treadmill result. Interestingly, the chest discomfort she is feeling is still consistent with costochondritis, however would be abnormal treadmill, this warrants further evaluation.  She is on a heart catheterization that was negative for any significant disease. We discussed the options of either redo catheterization or Treadmill Myoview. She would prefer to stick with a noninvasive approach. Plan: Exercise/Treadmill Myoview stress test with close followup if abnormal, will as will see back in one year.

## 2013-02-25 ENCOUNTER — Telehealth: Payer: Self-pay | Admitting: Cardiology

## 2013-02-25 NOTE — Telephone Encounter (Signed)
Left msg for pt to call and schedule stress test.

## 2013-02-27 ENCOUNTER — Ambulatory Visit
Admission: RE | Admit: 2013-02-27 | Discharge: 2013-02-27 | Disposition: A | Discharge status: Home / Self Care | Payer: BC Managed Care – PPO | Source: Ambulatory Visit | Attending: Endocrinology | Admitting: Endocrinology

## 2013-02-27 DIAGNOSIS — E049 Nontoxic goiter, unspecified: Secondary | ICD-10-CM

## 2013-02-28 ENCOUNTER — Telehealth (HOSPITAL_COMMUNITY): Payer: Self-pay | Admitting: *Deleted

## 2013-03-07 ENCOUNTER — Other Ambulatory Visit: Payer: Self-pay | Admitting: Endocrinology

## 2013-03-07 DIAGNOSIS — E049 Nontoxic goiter, unspecified: Secondary | ICD-10-CM

## 2013-03-08 ENCOUNTER — Other Ambulatory Visit: Payer: BC Managed Care – PPO

## 2013-03-13 ENCOUNTER — Encounter: Payer: Self-pay | Admitting: Cardiovascular Disease

## 2013-03-13 ENCOUNTER — Encounter: Payer: Self-pay | Admitting: *Deleted

## 2013-03-13 ENCOUNTER — Ambulatory Visit (INDEPENDENT_AMBULATORY_CARE_PROVIDER_SITE_OTHER): Payer: BC Managed Care – PPO | Admitting: Cardiovascular Disease

## 2013-03-13 VITALS — BP 135/82 | HR 62 | Ht 61.5 in | Wt 160.4 lb

## 2013-03-13 DIAGNOSIS — R079 Chest pain, unspecified: Secondary | ICD-10-CM | POA: Insufficient documentation

## 2013-03-13 DIAGNOSIS — R9439 Abnormal result of other cardiovascular function study: Secondary | ICD-10-CM

## 2013-03-13 LAB — CBC WITH DIFFERENTIAL/PLATELET
Basophils Absolute: 0.1 10*3/uL (ref 0.0–0.1)
Basophils Relative: 1 % (ref 0–1)
Hemoglobin: 12.4 g/dL (ref 12.0–15.0)
MCHC: 34.3 g/dL (ref 30.0–36.0)
Neutro Abs: 3.4 10*3/uL (ref 1.7–7.7)
Neutrophils Relative %: 55 % (ref 43–77)
Platelets: 328 10*3/uL (ref 150–400)
RDW: 14.9 % (ref 11.5–15.5)

## 2013-03-13 LAB — BASIC METABOLIC PANEL
BUN: 15 mg/dL (ref 6–23)
CO2: 28 mEq/L (ref 19–32)
Calcium: 10.3 mg/dL (ref 8.4–10.5)
Creat: 0.88 mg/dL (ref 0.50–1.10)
Glucose, Bld: 81 mg/dL (ref 70–99)

## 2013-03-13 LAB — PROTIME-INR
INR: 1.02 (ref <1.50)
Prothrombin Time: 13.4 seconds (ref 11.6–15.2)

## 2013-03-13 MED ORDER — AMLODIPINE BESYLATE 5 MG PO TABS: 5.0000 mg | ORAL_TABLET | Freq: Every day | ORAL | Status: DC

## 2013-03-13 NOTE — Progress Notes (Signed)
   History of Present Illness: 57 yo female with history of chest pain, HTN, asthma, anxiety who is here today to get a second cardiology opinion. She has been followed by Dr. David Harding in our practice. She has had frequent episodes of chest pain over the last few years. Cardiac catheterization January 2012 with no evidence of CAD. Echo January 2012 with normal LV function, no valve issues. Recent treadmill stress test in the [facility] office with 3 mm ST depression at peak exercise. She has seen Dr. Harding 02/22/13 and discussed repeat cath vs myoview and plans were made for a myoview. She has cancelled the myoview and is seeking a second opinion.   She tells me today that she has had pains in her mid back and there is radiation into her left shoulder, left arm, left leg and upper neck. She notes chest heaviness with exertion and with stress associated with SOB. Her left leg and arm frequently hurts with exertion. This is all worsened when lying on her left side. She feels dizzy much of the time. Overall fatigue. She has not been diagnosed with fibromyalgia in the past.    Primary Care Physician: Dr. Brian McKenzie   Past Medical History  Diagnosis Date  . Hypertension   . Migraines   . Allergy   . Anemia   . Asthma   . Anxiety   . Palpitations   . Hx of echocardiogram 091/2012    relatively normal as well. No significant valvar lesions. Normal Ef.  . H/O exercise stress test 2008; October 2014    was normal in 2008; positive for ischemia with inferior and lateral ST depressions October 2014    Past Surgical History  Procedure Laterality Date  . Cardiac catheterization  04/2010    with no evidence of ischemia or significant coronary disease to speak of, normal LV function with relatively normal EDP.  . Abdominal exploration surgery  1980  . Nasal sinus surgery      Current Outpatient Prescriptions  Medication Sig Dispense Refill  . acetaminophen (TYLENOL) 325 MG tablet Take  500 mg by mouth every 6 (six) hours as needed.      . amLODipine (NORVASC) 5 MG tablet Take 5 mg by mouth daily.      . cholecalciferol (VITAMIN D) 1000 UNITS tablet Take 1,000 Units by mouth. 3 x a week      . fexofenadine (ALLEGRA) 180 MG tablet Take 180 mg by mouth daily.      . FLUoxetine (PROZAC) 20 MG tablet Take 20 mg by mouth daily.      . LORazepam (ATIVAN) 1 MG tablet Take 1/2 to 1 maximum twice daily for anxiety only as needed  30 tablet  0  . Multiple Vitamins-Minerals (CENTRUM PO) Take 1 tablet by mouth.      . Multiple Vitamins-Minerals (MULTIVITAMIN WITH IRON-MINERALS) liquid Take by mouth daily.      . ranitidine (ZANTAC) 75 MG tablet Take 75 mg by mouth daily.       No current facility-administered medications for this visit.    Allergies  Allergen Reactions  . Codeine Other (See Comments)    Pounding in head   . Bee Venom     Swelling  . Milk-Related Compounds     Diarrhea & gas  . Shellfish Allergy     HIVES, SWELLING, N/V & DIARREHA  . Sulfur     History   Social History  . Marital Status: Single      Spouse Name: N/A    Number of Children: 0  . Years of Education: N/A   Occupational History  . Retired schoolteacher    Social History Main Topics  . Smoking status: Never Smoker   . Smokeless tobacco: Not on file  . Alcohol Use: No  . Drug Use: No  . Sexual Activity: Not on file   Other Topics Concern  . Not on file   Social History Narrative   Single woman, who lives alone. 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   She does not smoke or drink alcohol.   She occasionally exercises walking on a treadmill.    Family History  Problem Relation Age of Onset  . Hypertension Mother   . Hypertension Father   . Stroke Maternal Grandmother   . Atrial fibrillation Brother   . Stroke Brother   . Diabetes Maternal Grandmother   . Heart attack  Paternal Grandfather     Review of Systems:  As stated in the HPI and otherwise negative.   BP 135/82  Pulse 62  Ht 5' 1.5" (1.562 m)  Wt 160 lb 6.4 oz (72.757 kg)  BMI 29.82 kg/m2  Physical Examination: General: Well developed, well nourished, NAD HEENT: OP clear, mucus membranes moist SKIN: warm, dry. No rashes. Neuro: No focal deficits Musculoskeletal: Muscle strength 5/5 all ext Psychiatric: Mood and affect normal Neck: No JVD, no carotid bruits, no thyromegaly, no lymphadenopathy. Lungs:Clear bilaterally, no wheezes, rhonci, crackles Cardiovascular: Regular rate and rhythm. No murmurs, gallops or rubs. Abdomen:Soft. Bowel sounds present. Non-tender.  Extremities: No lower extremity edema. Pulses are 2 + in the bilateral DP/PT.  Assessment and Plan:   1. Chest pain: Pt has had atypical type chest pains, back pains, neck pains, leg pains for years. Recent EKG stress test showed ischemia during evaluation by Dr. Harding. She did have 3 mm ST depression at peak exercise but also hypertensive response to exercise. She is known to have normal coronary arteries by cath January 2012. Normal LV function by echo 2012. Given her symptoms and markedly abnormal stress test, will arrange repeat catheterization at [facility] 03/18/13. Pre-cath labs today. Risks and benefits reviewed. I do not think another echo is indicated at this time as she had a normal echo 2012 and her examination is normal. This may end up being fibromyalgia or other neuropathic type pain syndrome.   2. Abnormal EKG treadmill stress test: See above.    

## 2013-03-13 NOTE — Patient Instructions (Signed)
Your physician recommends that you schedule a follow-up appointment in: 4-5 weeks.    Your physician has requested that you have a cardiac catheterization. Cardiac catheterization is used to diagnose and/or treat various heart conditions. Doctors may recommend this procedure for a number of different reasons. The most common reason is to evaluate chest pain. Chest pain can be a symptom of coronary artery disease (CAD), and cardiac catheterization can show whether plaque is narrowing or blocking your heart's arteries. This procedure is also used to evaluate the valves, as well as measure the blood flow and oxygen levels in different parts of your heart. For further information please visit [email or web address] Please follow instruction sheet, as given. Scheduled for March 18, 2013

## 2013-03-18 ENCOUNTER — Ambulatory Visit (HOSPITAL_COMMUNITY)
Admission: RE | Admit: 2013-03-18 | Discharge: 2013-03-18 | Disposition: A | Discharge status: Home / Self Care | Payer: BC Managed Care – PPO | Source: Ambulatory Visit | Attending: Cardiovascular Disease | Admitting: Cardiovascular Disease

## 2013-03-18 ENCOUNTER — Encounter (HOSPITAL_COMMUNITY)
Admission: RE | Disposition: A | Discharge status: Home / Self Care | Payer: Self-pay | Source: Ambulatory Visit | Attending: Cardiovascular Disease

## 2013-03-18 ENCOUNTER — Encounter (HOSPITAL_COMMUNITY): Payer: Self-pay | Admitting: Pharmacy Technician

## 2013-03-18 DIAGNOSIS — J45909 Unspecified asthma, uncomplicated: Secondary | ICD-10-CM | POA: Insufficient documentation

## 2013-03-18 DIAGNOSIS — R9439 Abnormal result of other cardiovascular function study: Secondary | ICD-10-CM | POA: Insufficient documentation

## 2013-03-18 DIAGNOSIS — R079 Chest pain, unspecified: Secondary | ICD-10-CM | POA: Insufficient documentation

## 2013-03-18 DIAGNOSIS — I10 Essential (primary) hypertension: Secondary | ICD-10-CM | POA: Insufficient documentation

## 2013-03-18 DIAGNOSIS — F411 Generalized anxiety disorder: Secondary | ICD-10-CM | POA: Insufficient documentation

## 2013-03-18 HISTORY — PX: LEFT HEART CATHETERIZATION WITH CORONARY ANGIOGRAM: SHX5451

## 2013-03-18 SURGERY — LEFT HEART CATHETERIZATION WITH CORONARY ANGIOGRAM
Anesthesia: LOCAL

## 2013-03-18 MED ORDER — SODIUM CHLORIDE 0.9 % IV SOLN: INTRAVENOUS | Status: AC

## 2013-03-18 MED ORDER — DIAZEPAM 5 MG PO TABS
5.0000 mg | ORAL_TABLET | ORAL | Status: AC
  Administered 2013-03-18: 5 mg via ORAL

## 2013-03-18 MED ORDER — SODIUM CHLORIDE 0.9 % IJ SOLN: 3.0000 mL | Freq: Two times a day (BID) | INTRAMUSCULAR | Status: DC

## 2013-03-18 MED ORDER — NITROGLYCERIN 0.2 MG/ML ON CALL CATH LAB
INTRAVENOUS | Status: AC
  Filled 2013-03-18: qty 1

## 2013-03-18 MED ORDER — VERAPAMIL HCL 2.5 MG/ML IV SOLN
INTRAVENOUS | Status: AC
  Filled 2013-03-18: qty 2

## 2013-03-18 MED ORDER — LIDOCAINE HCL (PF) 1 % IJ SOLN
INTRAMUSCULAR | Status: AC
  Filled 2013-03-18: qty 30

## 2013-03-18 MED ORDER — HEPARIN (PORCINE) IN NACL 2-0.9 UNIT/ML-% IJ SOLN
INTRAMUSCULAR | Status: AC
  Filled 2013-03-18: qty 1000

## 2013-03-18 MED ORDER — SODIUM CHLORIDE 0.9 % IJ SOLN: 3.0000 mL | INTRAMUSCULAR | Status: DC | PRN

## 2013-03-18 MED ORDER — HEPARIN SODIUM (PORCINE) 1000 UNIT/ML IJ SOLN
INTRAMUSCULAR | Status: AC
  Filled 2013-03-18: qty 1

## 2013-03-18 MED ORDER — ASPIRIN 81 MG PO CHEW
81.0000 mg | CHEWABLE_TABLET | ORAL | Status: AC
  Administered 2013-03-18: 81 mg via ORAL

## 2013-03-18 MED ORDER — DIAZEPAM 5 MG PO TABS
ORAL_TABLET | ORAL | Status: AC
  Filled 2013-03-18: qty 1

## 2013-03-18 MED ORDER — SODIUM CHLORIDE 0.9 % IV SOLN: INTRAVENOUS | Status: DC

## 2013-03-18 MED ORDER — MIDAZOLAM HCL 2 MG/2ML IJ SOLN
INTRAMUSCULAR | Status: AC
  Filled 2013-03-18: qty 2

## 2013-03-18 MED ORDER — ASPIRIN 81 MG PO CHEW
CHEWABLE_TABLET | ORAL | Status: AC
  Filled 2013-03-18: qty 1

## 2013-03-18 MED ORDER — SODIUM CHLORIDE 0.9 % IV SOLN: 250.0000 mL | INTRAVENOUS | Status: DC | PRN

## 2013-03-18 MED ORDER — ONDANSETRON HCL 4 MG/2ML IJ SOLN: 4.0000 mg | Freq: Four times a day (QID) | INTRAMUSCULAR | Status: DC | PRN

## 2013-03-18 MED ORDER — FENTANYL CITRATE 0.05 MG/ML IJ SOLN
INTRAMUSCULAR | Status: AC
  Filled 2013-03-18: qty 2

## 2013-03-18 MED ORDER — ACETAMINOPHEN 325 MG PO TABS: 650.0000 mg | ORAL_TABLET | ORAL | Status: DC | PRN

## 2013-03-18 NOTE — Progress Notes (Signed)
Shell fish allergy reported to [facility] who advised Dr Clifton James.  No further orders

## 2013-03-18 NOTE — CV Procedure (Signed)
      Cardiac Catheterization Operative Report  Bianca [name] [id number] 12/1/20148:08 AM Thayer Headings, MD  Procedure Performed:  1. Left Heart Catheterization 2. Selective Coronary Angiography 3. Left ventricular angiogram  Operator: Verne Carrow, MD  Arterial access site:  Right radial artery.   Indication:  57 yo female with history of asthma, chest pain with recent treadmill stress test per Dr. Herbie Baltimore with ST segment depression c/w ischemia. Cardiac cath to exclude obstructive CAD.                                      Procedure Details: The risks, benefits, complications, treatment options, and expected outcomes were discussed with the patient. The patient and/or family concurred with the proposed plan, giving informed consent. The patient was brought to the cath lab after IV hydration was begun and oral premedication was given. The patient was further sedated with Versed and Fentanyl. The right wrist was assessed with an Allens test which was positive. The right wrist was prepped and draped in a sterile fashion. 1% lidocaine was used for local anesthesia. Using the modified Seldinger access technique, a 5 French sheath was placed in the right radial artery. 3 mg Verapamil was given through the sheath. 4000 units IV heparin was given. Standard diagnostic catheters were used to perform selective coronary angiography. A pigtail catheter was used to perform a left ventricular angiogram. The sheath was removed from the right radial artery and a Terumo hemostasis band was applied at the arteriotomy site on the right wrist.   There were no immediate complications. The patient was taken to the recovery area in stable condition.   Hemodynamic Findings: Central aortic pressure: 126/73 Left ventricular pressure: 124/15/23  Angiographic Findings:  Left main: No obstructive disease.   Left Anterior Descending Artery: Large caliber vessel that courses to the apex. There are  several small caliber diagonal branches. No obstructive disease.   Circumflex Artery: Large caliber vessel with large obtuse marginal branch. No obstructive disease.   Right Coronary Artery: Large dominant vessel with no obstructive disease.   Left Ventricular Angiogram: LVEF=65%.   Impression: 1. No angiographic evidence of CAD 2. Normal LV systolic function 3. Chest pain, cannot fully exclude coronary vasospasm with ST depression on treadmill exercise test.   Recommendations: Continue amlodipine. Consider use of Imdur in future as there is a possibility of coronary vasospasm.        Complications:  None. The patient tolerated the procedure well.

## 2013-03-18 NOTE — Interval H&P Note (Signed)
History and Physical Interval Note:  03/18/2013 7:41 AM  Bianca [name]  has presented today for cardiac cath with the diagnosis of Chest pain/abnormal stress test.  The various methods of treatment have been discussed with the patient and family. After consideration of risks, benefits and other options for treatment, the patient has consented to  Procedure(s): LEFT HEART CATHETERIZATION WITH CORONARY ANGIOGRAM (N/A) as a surgical intervention .  The patient's history has been reviewed, patient examined, no change in status, stable for surgery.  I have reviewed the patient's chart and labs.  Questions were answered to the patient's satisfaction.    Cath Lab Visit (complete for each Cath Lab visit)  Clinical Evaluation Leading to the Procedure:   ACS: no  Non-ACS:    Anginal Classification: CCS III  Anti-ischemic medical therapy: Minimal Therapy (1 class of medications)  Non-Invasive Test Results: Equivocal test results  Prior CABG: No previous CABG         MCALHANY,CHRISTOPHER

## 2013-03-18 NOTE — Progress Notes (Signed)
Attempted to remove 3cc of air from RT TRB. Small amount of oozing visible. 3cc of air injected back into TRB. Site level 0.

## 2013-03-18 NOTE — H&P (View-Only) (Signed)
History of Present Illness: 57 yo female with history of chest pain, HTN, asthma, anxiety who is here today to get a second cardiology opinion. She has been followed by Dr. Bryan Lemma in our practice. She has had frequent episodes of chest pain over the last few years. Cardiac catheterization January 2012 with no evidence of CAD. Echo January 2012 with normal LV function, no valve issues. Recent treadmill stress test in the [facility] office with 3 mm ST depression at peak exercise. She has seen Dr. Herbie Baltimore 02/22/13 and discussed repeat cath vs myoview and plans were made for a myoview. She has cancelled the [facility] and is seeking a second opinion.   She tells me today that she has had pains in her mid back and there is radiation into her left shoulder, left arm, left leg and upper neck. She notes chest heaviness with exertion and with stress associated with SOB. Her left leg and arm frequently hurts with exertion. This is all worsened when lying on her left side. She feels dizzy much of the time. Overall fatigue. She has not been diagnosed with fibromyalgia in the past.    Primary Care Physician: Dr. Shary Decamp   Past Medical History  Diagnosis Date  . Hypertension   . Migraines   . Allergy   . Anemia   . Asthma   . Anxiety   . Palpitations   . Hx of echocardiogram 091/2012    relatively normal as well. No significant valvar lesions. Normal Ef.  . H/O exercise stress test 2008; October 2014    was normal in 2008; positive for ischemia with inferior and lateral ST depressions October 2014    Past Surgical History  Procedure Laterality Date  . Cardiac catheterization  04/2010    with no evidence of ischemia or significant coronary disease to speak of, normal LV function with relatively normal EDP.  Marland Kitchen Abdominal exploration surgery  1980  . Nasal sinus surgery      Current Outpatient Prescriptions  Medication Sig Dispense Refill  . acetaminophen (TYLENOL) 325 MG tablet Take  500 mg by mouth every 6 (six) hours as needed.      Marland Kitchen amLODipine (NORVASC) 5 MG tablet Take 5 mg by mouth daily.      . cholecalciferol (VITAMIN D) 1000 UNITS tablet Take 1,000 Units by mouth. 3 x a week      . fexofenadine (ALLEGRA) 180 MG tablet Take 180 mg by mouth daily.      Marland Kitchen FLUoxetine (PROZAC) 20 MG tablet Take 20 mg by mouth daily.      Marland Kitchen LORazepam (ATIVAN) 1 MG tablet Take 1/2 to 1 maximum twice daily for anxiety only as needed  30 tablet  0  . Multiple Vitamins-Minerals (CENTRUM PO) Take 1 tablet by mouth.      . Multiple Vitamins-Minerals (MULTIVITAMIN WITH IRON-MINERALS) liquid Take by mouth daily.      . ranitidine (ZANTAC) 75 MG tablet Take 75 mg by mouth daily.       No current facility-administered medications for this visit.    Allergies  Allergen Reactions  . Codeine Other (See Comments)    Pounding in head   . Bee Venom     Swelling  . Milk-Related Compounds     Diarrhea & gas  . Shellfish Allergy     HIVES, SWELLING, N/V & DIARREHA  . Sulfur     History   Social History  . Marital Status: Single  Spouse Name: N/A    Number of Children: 0  . Years of Education: N/A   Occupational History  . Retired Chartered loss adjuster    Social History Main Topics  . Smoking status: Never Smoker   . Smokeless tobacco: Not on file  . Alcohol Use: No  . Drug Use: No  . Sexual Activity: Not on file   Other Topics Concern  . Not on file   Social History Narrative   Single woman, who lives alone. 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   She does not smoke or drink alcohol.   She occasionally exercises walking on a treadmill.    Family History  Problem Relation Age of Onset  . Hypertension Mother   . Hypertension Father   . Stroke Maternal Grandmother   . Atrial fibrillation Brother   . Stroke Brother   . Diabetes Maternal Grandmother   . Heart attack  Paternal Grandfather     Review of Systems:  As stated in the HPI and otherwise negative.   BP 135/82  Pulse 62  Ht 5' 1.5" (1.562 m)  Wt 160 lb 6.4 oz (72.757 kg)  BMI 29.82 kg/m2  Physical Examination: General: Well developed, well nourished, NAD HEENT: OP clear, mucus membranes moist SKIN: warm, dry. No rashes. Neuro: No focal deficits Musculoskeletal: Muscle strength 5/5 all ext Psychiatric: Mood and affect normal Neck: No JVD, no carotid bruits, no thyromegaly, no lymphadenopathy. Lungs:Clear bilaterally, no wheezes, rhonci, crackles Cardiovascular: Regular rate and rhythm. No murmurs, gallops or rubs. Abdomen:Soft. Bowel sounds present. Non-tender.  Extremities: No lower extremity edema. Pulses are 2 + in the bilateral DP/PT.  Assessment and Plan:   1. Chest pain: Pt has had atypical type chest pains, back pains, neck pains, leg pains for years. Recent EKG stress test showed ischemia during evaluation by Dr. Herbie Baltimore. She did have 3 mm ST depression at peak exercise but also hypertensive response to exercise. She is known to have normal coronary arteries by cath January 2012. Normal LV function by echo 2012. Given her symptoms and markedly abnormal stress test, will arrange repeat catheterization at [facility] 12/56/14. Pre-cath labs today. Risks and benefits reviewed. I do not think another echo is indicated at this time as she had a normal echo 2012 and her examination is normal. This may end up being fibromyalgia or other neuropathic type pain syndrome.   2. Abnormal EKG treadmill stress test: See above.

## 2013-04-10 ENCOUNTER — Telehealth: Payer: Self-pay | Admitting: Physician Assistant

## 2013-04-10 NOTE — Telephone Encounter (Signed)
Patient called the answering svc c/o R wrist pain, redness, tenderness to palpation and linear streaking. She had a cardiac cath 12/1 accessed via the R radial artery. She was lifting bags of hay yesterday, and developed pain/stiffness, followed by the above symptoms. No fevers or chills. Patient is over 3 weeks out from cath; however, symptoms are concerning for localized infection w/ lymphatic involvement. Will prescribe Augmentin x 7 days. She is out of town and this will be called in to a Insurance claims handler. Advised to continue to monitor, apply ice and NSAIDs PRN w/ food for pain. If worsens, advised to present to nearby urgent care or to be seen in the office next week.   Jacqulyn Bath, PA-C 04/10/2013 12:15 PM

## 2013-04-10 NOTE — Telephone Encounter (Signed)
Agree. Thanks, chris 

## 2013-04-17 ENCOUNTER — Telehealth: Payer: Self-pay | Admitting: Cardiovascular Disease

## 2013-04-17 NOTE — Telephone Encounter (Signed)
Pt. Called, she had a cardiac cath on 03/18/13. Via right wrist the site was read and hard to touch. She was prescribed the antibiotics Augmentin for 7 days and she got diarrhea and had to go to the ER. There she was prescribed Asprin now the wrist is better, the swelling and redness has almost going away. Pt has an appointment for 04/24/13 at 3:30 PM pt will keep that appointment. Pt is to call the office if needed.

## 2013-04-17 NOTE — Telephone Encounter (Signed)
New problem   Pt has had difficulty with artiy uesed for Cath.   Flabitis.  Went to ER 12/24 [place].  Pt needs a call back please.

## 2013-04-24 ENCOUNTER — Ambulatory Visit (INDEPENDENT_AMBULATORY_CARE_PROVIDER_SITE_OTHER): Payer: BC Managed Care – PPO | Admitting: Cardiovascular Disease

## 2013-04-24 ENCOUNTER — Encounter: Payer: Self-pay | Admitting: Cardiovascular Disease

## 2013-04-24 VITALS — BP 132/83 | HR 59 | Ht 61.0 in | Wt 158.0 lb

## 2013-04-24 DIAGNOSIS — R079 Chest pain, unspecified: Secondary | ICD-10-CM

## 2013-04-24 NOTE — Patient Instructions (Signed)
Your physician recommends that you schedule a follow-up appointment as needed with Dr. McAlhany   

## 2013-04-24 NOTE — Progress Notes (Signed)
History of Present Illness: 58 yo female with history of chest pain, HTN, asthma, anxiety who is here today for cardiac follow up. I saw her as a new patient 03/13/13 for a second cardiology opinion. She has been followed by Dr. Bryan Lemma in our practice. She has had frequent episodes of chest pain over the last few years. Cardiac catheterization January 2012 with no evidence of CAD. Echo January 2012 with normal LV function, no valve issues. Recent treadmill stress test in the [facility] office with 3 mm ST depression at peak exercise. She has seen Dr. Herbie Baltimore 02/22/13 and discussed repeat cath vs myoview and plans were made for a myoview. She has cancelled the myoview. She told me that she has had pains in her mid back and there is radiation into her left shoulder, left arm, left leg and upper neck. She notes chest heaviness with exertion and with stress associated with SOB. Her left leg and arm frequently hurts with exertion. This is all worsened when lying on her left side. She feels dizzy much of the time. Overall fatigue. She has not been diagnosed with fibromyalgia in the past. I arranged a cardiac cath on 03/18/13 and she had normal coronary arteries. Cannot fully exclude coronary vasospasm given EKG changes with stress test. She called in with arm pain above the right radial cath site, red streaking up the right forearm. She was started on Augmentin for 7 days. She developed N/V and diarrhea and was seen in the ED and told to take ASA. Her right arm is feeling better now but still a little sore. No recent chest pains.   Primary Care Physician: Dr. Shary Decamp   Past Medical History  Diagnosis Date  . Hypertension   . Migraines   . Allergy   . Anemia   . Asthma   . Anxiety   . Palpitations   . Hx of echocardiogram 091/2012    relatively normal as well. No significant valvar lesions. Normal Ef.  . H/O exercise stress test 2008; October 2014    was normal in 2008; positive for  ischemia with inferior and lateral ST depressions October 2014    Past Surgical History  Procedure Laterality Date  . Cardiac catheterization  04/2010    with no evidence of ischemia or significant coronary disease to speak of, normal LV function with relatively normal EDP.  Marland Kitchen Abdominal exploration surgery  1980  . Nasal sinus surgery      Current Outpatient Prescriptions  Medication Sig Dispense Refill  . acetaminophen (TYLENOL) 325 MG tablet Take 500 mg by mouth every 6 (six) hours as needed for mild pain.       Marland Kitchen amLODipine (NORVASC) 5 MG tablet Take 5 mg by mouth daily.      . cholecalciferol (VITAMIN D) 1000 UNITS tablet Take 1,000 Units by mouth 3 (three) times a week.      . fexofenadine (ALLEGRA) 180 MG tablet Take 180 mg by mouth daily.      Marland Kitchen FLUoxetine (PROZAC) 20 MG tablet Take 20 mg by mouth daily.      Marland Kitchen LORazepam (ATIVAN) 1 MG tablet Take 0.5 mg by mouth daily as needed. Take 1/2 to 1 maximum twice daily for anxiety only as needed      . Multiple Vitamins-Minerals (CENTRUM PO) Take 1 tablet by mouth 3 (three) times a week.       . pseudoephedrine-acetaminophen (TYLENOL SINUS) 30-500 MG TABS Take 2 tablets by mouth  as needed.       . ranitidine (ZANTAC) 75 MG tablet Take 75 mg by mouth daily.       No current facility-administered medications for this visit.    Allergies  Allergen Reactions  . Codeine Other (See Comments)    Pounding in head   . Bee Venom     Swelling  . Milk-Related Compounds     Diarrhea & gas  . Shellfish Allergy     HIVES, SWELLING, N/V & DIARREHA  . Sulfur     History   Social History  . Marital Status: Single    Spouse Name: N/A    Number of Children: 0  . Years of Education: N/A   Occupational History  . Retired Chartered loss adjusterschoolteacher    Social History Main Topics  . Smoking status: Never Smoker   . Smokeless tobacco: Not on file  . Alcohol Use: No  . Drug Use: No  . Sexual Activity: Not on file   Other Topics Concern  . Not on  file   Social History Narrative   Single woman, who lives alone. She is a retired Chartered loss adjusterschoolteacher, but former Comptrollerlibrarian. 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   She does not smoke or drink alcohol.   She occasionally exercises walking on a treadmill.    Family History  Problem Relation Age of Onset  . Hypertension Mother   . Hypertension Father   . Stroke Maternal Grandmother   . Atrial fibrillation Brother   . Stroke Brother   . Diabetes Maternal Grandmother   . Heart attack Paternal Grandfather     Review of Systems:  As stated in the HPI and otherwise negative.   BP 132/83  Pulse 59  Ht 5\' 1"  (1.549 m)  Wt 158 lb (71.668 kg)  BMI 29.87 kg/m2  LMP 11/16/2009  Physical Examination: General: Well developed, well nourished, NAD HEENT: OP clear, mucus membranes moist SKIN: warm, dry. No rashes. Neuro: No focal deficits Musculoskeletal: Muscle strength 5/5 all ext Psychiatric: Mood and affect normal Neck: No JVD, no carotid bruits, no thyromegaly, no lymphadenopathy. Lungs:Clear bilaterally, no wheezes, rhonci, crackles Cardiovascular: Regular rate and rhythm. No murmurs, gallops or rubs. Abdomen:Soft. Bowel sounds present. Non-tender.  Extremities: No lower extremity edema. Pulses are 2 + in the bilateral DP/PT. Right arm without bruising. Ulnar and radial pulses are 2+.   Cardiac cath 03/18/13: Left main: No obstructive disease.  Left Anterior Descending Artery: Large caliber vessel that courses to the apex. There are several small caliber diagonal branches. No obstructive disease.  Circumflex Artery: Large caliber vessel with large obtuse marginal branch. No obstructive disease.  Right Coronary Artery: Large dominant vessel with no obstructive disease.  Left Ventricular Angiogram: LVEF=65%.  Impression:  1. No angiographic evidence of CAD  2. Normal LV systolic function  3. Chest pain, cannot fully exclude  coronary vasospasm with ST depression on treadmill exercise test.   EKG: Sinus brady, rate 59 bpm.    Assessment and Plan:   1. Chest pain: No evidence of CAD on recent cath. May be related to coronary vasospasm. Will continue Norvasc. No further cardiac workup.

## 2013-08-19 ENCOUNTER — Other Ambulatory Visit: Payer: BC Managed Care – PPO

## 2013-11-12 ENCOUNTER — Encounter: Payer: Self-pay | Admitting: Physician Assistant

## 2013-11-12 ENCOUNTER — Ambulatory Visit (INDEPENDENT_AMBULATORY_CARE_PROVIDER_SITE_OTHER): Payer: BC Managed Care – PPO | Admitting: Physician Assistant

## 2013-11-12 VITALS — BP 120/79 | HR 58 | Ht 61.0 in | Wt 157.0 lb

## 2013-11-12 DIAGNOSIS — R42 Dizziness and giddiness: Secondary | ICD-10-CM

## 2013-11-12 DIAGNOSIS — I10 Essential (primary) hypertension: Secondary | ICD-10-CM

## 2013-11-12 MED ORDER — AMLODIPINE BESYLATE 2.5 MG PO TABS: 2.5000 mg | ORAL_TABLET | Freq: Every day | ORAL | Status: DC

## 2013-11-12 NOTE — Progress Notes (Signed)
Cardiology Office Note    Date:  11/12/2013   ID:  Bianca [name], DOB 06/13/1955, MRN [id number]  PCP:  Thayer HeadingsMACKENZIE,BRIAN, MD  Cardiologist:  Dr. Verne Carrowhristopher McAlhany      History of Present Illness: Bianca [name] is a 58 y.o. female with a hx of chest pain, HTN, asthma, anxiety.  LHC in 03/2013 demonstrated no CAD.  She previously had ischemic ECG changes on ETT.  She has been treated for possible vasospasm.  Last seen by Dr. Verne Carrowhristopher McAlhany in 04/2013.    She presents for evaluation of dizziness.  It mainly occurs when she is driving.  She notes it with certain head position changes.  She denies syncope or near syncope.  She denies chest pain, dyspnea, orthopnea, PND, edema.  She has seen ENT and workup was unrevealing. She denies postural dizziness.  She notes blurry vision and she has seen optometry.    Studies:  - LHC (03/2013):  No CAD, EF 65%  - Echo (04/2010):  EF 55%  - Carotid US (4/14):  Normal    Recent Labs: 03/13/2013: Creatinine 0.88; Hemoglobin 12.4; Potassium 3.7   Wt Readings from Last 3 Encounters:  11/12/13 157 lb (71.215 kg)  04/24/13 158 lb (71.668 kg)  03/18/13 160 lb (72.576 kg)     Past Medical History  Diagnosis Date  . Hypertension   . Migraines   . Allergy   . Anemia   . Asthma   . Anxiety   . Palpitations   . Hx of echocardiogram 091/2012    relatively normal as well. No significant valvar lesions. Normal Ef.  . H/O exercise stress test 2008; October 2014    was normal in 2008; positive for ischemia with inferior and lateral ST depressions October 2014    Current Outpatient Prescriptions  Medication Sig Dispense Refill  . acetaminophen (TYLENOL) 325 MG tablet Take 500 mg by mouth every 6 (six) hours as needed for mild pain.       Marland Kitchen. amLODipine (NORVASC) 5 MG tablet Take 5 mg by mouth daily.      . cholecalciferol (VITAMIN D) 1000 UNITS tablet Take 1,000 Units by mouth 3 (three) times a week.      . fexofenadine (ALLEGRA) 180 MG  tablet Take 180 mg by mouth daily.      Marland Kitchen. FLUoxetine (PROZAC) 20 MG tablet Take 20 mg by mouth daily.      Marland Kitchen. LORazepam (ATIVAN) 1 MG tablet Take 0.5 mg by mouth daily as needed. Take 1/2 to 1 maximum twice daily for anxiety only as needed      . Multiple Vitamins-Minerals (CENTRUM PO) Take 1 tablet by mouth 3 (three) times a week.       . pseudoephedrine-acetaminophen (TYLENOL SINUS) 30-500 MG TABS Take 2 tablets by mouth as needed.       . ranitidine (ZANTAC) 75 MG tablet Take 75 mg by mouth daily.       No current facility-administered medications for this visit.    Allergies:   Codeine; Amoxicillin; Augmentin; Bee venom; Milk-related compounds; Shellfish allergy; and Sulfur   Social History:  The patient  reports that she has never smoked. She does not have any smokeless tobacco history on file. She reports that she does not drink alcohol or use illicit drugs.   Family History:  The patient's family history includes Anemia in her mother; Asthma in her mother; Atrial fibrillation in her brother; Cancer in her maternal grandfather and paternal grandfather; Diabetes in  her maternal grandmother, paternal grandfather, and paternal grandmother; Heart attack in her paternal grandfather; Hypertension in her brother, father, mother, sister, and another family member; Stroke in her brother, father, and maternal grandmother.   ROS:  Please see the history of present illness.      All other systems reviewed and negative.   PHYSICAL EXAM: VS:  BP 120/79  Pulse 58  Ht 5\' 1"  (1.549 m)  Wt 157 lb (71.215 kg)  BMI 29.68 kg/m2  LMP 11/16/2009 Well nourished, well developed, in no acute distress HEENT: normal Neck: no JVD Cardiac:  normal S1, S2; RRR; no murmur Lungs:  clear to auscultation bilaterally, no wheezing, rhonchi or rales Abd: soft, nontender, no hepatomegaly Ext: no edema Skin: warm and dry Neuro:  CNs 2-12 intact, no focal abnormalities noted  EKG:  Sinus brady, HR 58, normal axis,  NSSTTW changes     ASSESSMENT AND PLAN:  Dizziness:  She is concerned that Amlodipine is contributing to her symptoms.  Her symptoms generally occur while driving and with head position changes.  I am not convinced that her med is contributing.  Check BMET, CBC, TSH.  We had a long discussion and decided to try decreasing her Norvasc to 2.5 QHS to see if this helps.  I have recommend that she see neurology. I will make that referral.   Essential hypertension:  Controlled.  Change dose of Norvasc and time of day as noted above.  She will monitor her BPs for changes.     Disposition:  F/u with me in 2-3 mos.   Signed, Brynda Rim, MHS 11/12/2013 3:58 PM    [facility] [address] [place], [place], [place]  [address] Phone: [phone]; Fax: [phone]

## 2013-11-12 NOTE — Patient Instructions (Addendum)
LAB WORK TODAY; BMET, CBC W/DIFF, TSH  CHANGE NORVASC TO 2.5 MG EVERY NIGHT ; NEW RX SENT IN TODAY  [facility]  Your physician recommends that you schedule a follow-up appointment in: 2-3 MONTHS WITH SCOTT WEAVER, PAC SAME DAY DR. Clifton JamesMCALHANY

## 2013-11-13 LAB — CBC WITH DIFFERENTIAL/PLATELET
BASOS ABS: 0 10*3/uL (ref 0.0–0.1)
Basophils Relative: 0.7 % (ref 0.0–3.0)
EOS PCT: 1.6 % (ref 0.0–5.0)
Eosinophils Absolute: 0.1 10*3/uL (ref 0.0–0.7)
HEMATOCRIT: 36.7 % (ref 36.0–46.0)
Hemoglobin: 12.5 g/dL (ref 12.0–15.0)
LYMPHS ABS: 2.7 10*3/uL (ref 0.7–4.0)
Lymphocytes Relative: 43 % (ref 12.0–46.0)
MCHC: 34.1 g/dL (ref 30.0–36.0)
MCV: 86.2 fl (ref 78.0–100.0)
Monocytes Absolute: 0.1 10*3/uL (ref 0.1–1.0)
Monocytes Relative: 2.3 % — ABNORMAL LOW (ref 3.0–12.0)
NEUTROS PCT: 52.4 % (ref 43.0–77.0)
Neutro Abs: 3.3 10*3/uL (ref 1.4–7.7)
PLATELETS: 357 10*3/uL (ref 150.0–400.0)
RBC: 4.26 Mil/uL (ref 3.87–5.11)
RDW: 14.5 % (ref 11.5–15.5)
WBC: 6.4 10*3/uL (ref 4.0–10.5)

## 2013-11-13 LAB — TSH: TSH: 0.95 u[IU]/mL (ref 0.35–4.50)

## 2013-11-13 LAB — BASIC METABOLIC PANEL
BUN: 16 mg/dL (ref 6–23)
CO2: 30 mEq/L (ref 19–32)
CREATININE: 0.7 mg/dL (ref 0.4–1.2)
Calcium: 9.9 mg/dL (ref 8.4–10.5)
Chloride: 103 mEq/L (ref 96–112)
GFR: 108.97 mL/min (ref >60.00)
GLUCOSE: 80 mg/dL (ref 70–99)
POTASSIUM: 4.2 meq/L (ref 3.5–5.1)
Sodium: 139 mEq/L (ref 135–145)

## 2013-11-14 ENCOUNTER — Telehealth: Payer: Self-pay | Admitting: *Deleted

## 2013-11-14 NOTE — Telephone Encounter (Signed)
pt notified about lab results with verbal understanding  

## 2013-11-29 ENCOUNTER — Telehealth: Payer: Self-pay | Admitting: Physician Assistant

## 2013-11-29 NOTE — Telephone Encounter (Signed)
Per Neuro--Pt does not want to make appt at this time. We will be happy to see patient if she calls us back. Thank you for the referral Annabelle Harmanana

## 2013-11-29 NOTE — Telephone Encounter (Signed)
FYI.Marland Kitchen.Marland Kitchen.Marland Kitchen.See phone note; pt does not want to want to make appt with Neuro at this time.

## 2013-12-08 NOTE — Telephone Encounter (Signed)
[address] [place], [place]   12/08/2013 6:42 AM

## 2014-01-17 ENCOUNTER — Telehealth: Payer: Self-pay | Admitting: Physician Assistant

## 2014-01-17 DIAGNOSIS — I10 Essential (primary) hypertension: Secondary | ICD-10-CM

## 2014-01-17 MED ORDER — AMLODIPINE BESYLATE 2.5 MG PO TABS: 2.5000 mg | ORAL_TABLET | Freq: Two times a day (BID) | ORAL | Status: DC

## 2014-01-17 NOTE — Telephone Encounter (Signed)
Spoke with pt.  She was taking amlodipine 5 mg by mouth daily but this was changed at last office visit with Lilian ComaScott Weaver,PA to 2.5 mg daily at bedtime.  Pt reports change was made due to dizziness and feeling like her blood pressure was low at times.  Pt reports she made this change but blood pressure went up to 140/90 range around 10 or 11 AM. She started taking additional 2.5 mg amlodipine every AM. She reports she is doing well on this dose. No dizziness and blood pressure OK.  She is asking if OK to continue 2.5 mg amlodipine twice daily. Will send to Dr. Clifton JamesMcAlhany to review.  [facility] [address]

## 2014-01-17 NOTE — Telephone Encounter (Signed)
Continue twice daily dosage of 2.5 mg. cdm

## 2014-01-17 NOTE — Telephone Encounter (Signed)
New message     Pt has been taking amlodipine 2.5mg  twice a day.  The presc was written for 2.5mg  daily.  Now she is out of medication.  Will it be ok to continue taking 2.5mg  bid? If yes, need [facility]  Please let pt know what Lorin PicketScott said.

## 2014-01-17 NOTE — Telephone Encounter (Signed)
Instructions left from Dr. Clifton JamesMcAlhany left on pt's identified voicemail. Will send prescription to pharmacy. Pt was to see Tereso NewcomerScott Weaver, PA in 2-3 months after last office visit. I left message for pt to call to schedule follow up appt.

## 2014-03-27 ENCOUNTER — Encounter (HOSPITAL_COMMUNITY): Payer: Self-pay | Admitting: Cardiovascular Disease

## 2014-04-09 IMAGING — US US THYROID BIOPSY
1 series · 9 of 9 positions shown · non-contrast
Comparison: Thyroid ultrasound dated 08/02/2012

CLINICAL DATA: Dominant left thyroid nodule.

ULTRASOUND GUIDED NEEDLE ASPIRATE BIOPSY OF THE THYROID GLAND

[Series 1: us thyroid biopsy · 9 acquisitions, 9 frames shown]
[im 1/9]
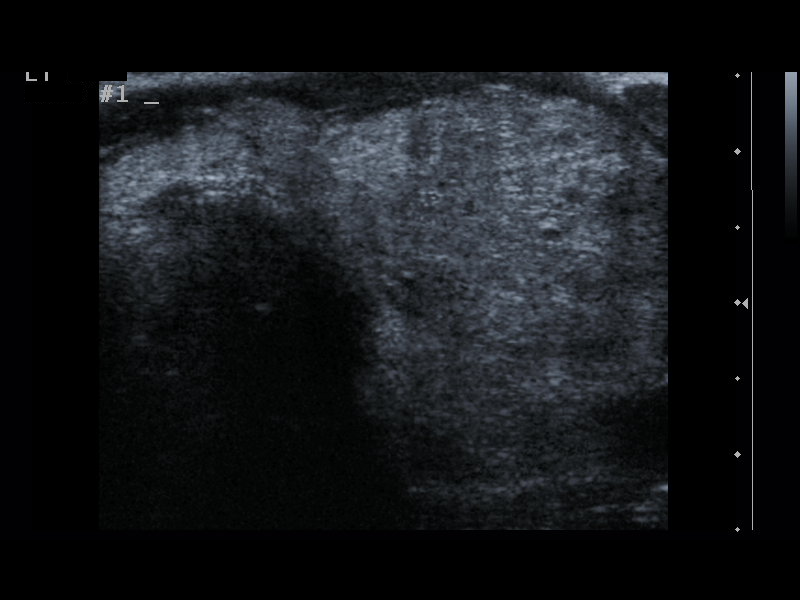
[im 2/9]
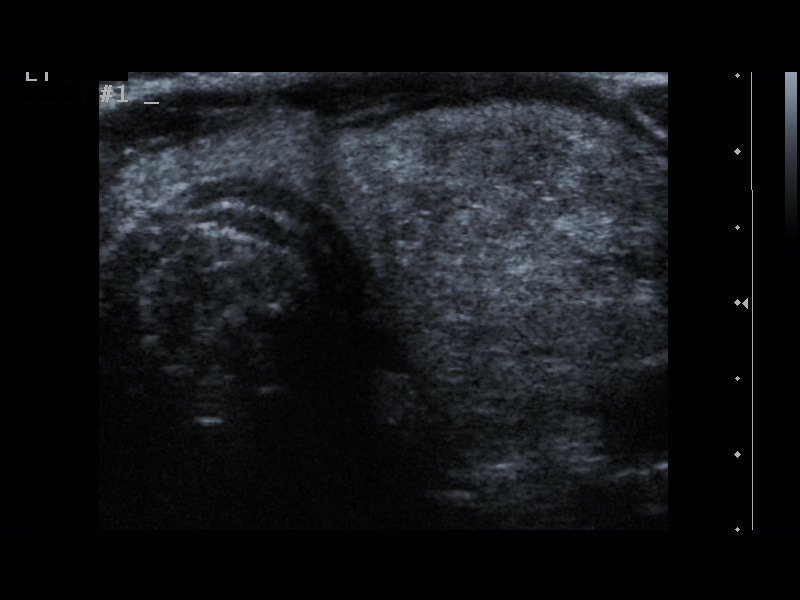
[im 3/9]
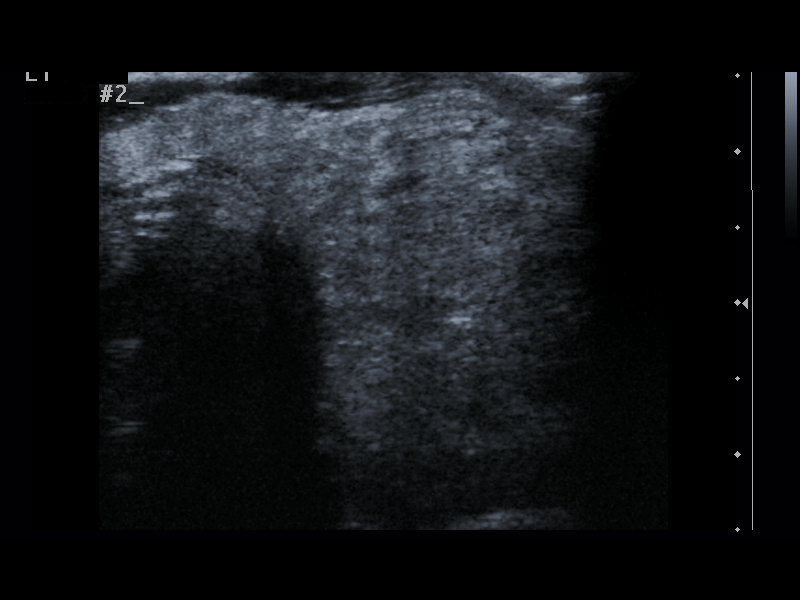
[im 4/9]
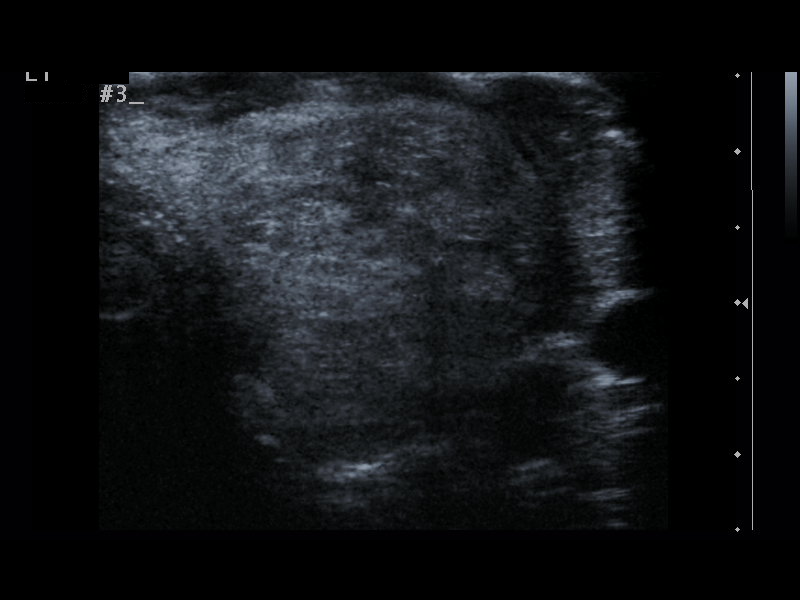
[im 5/9]
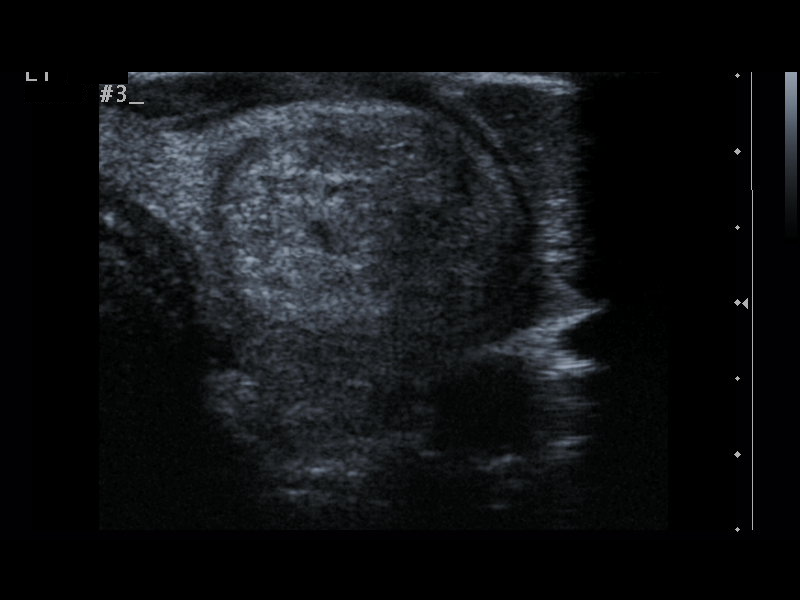
[im 6/9]
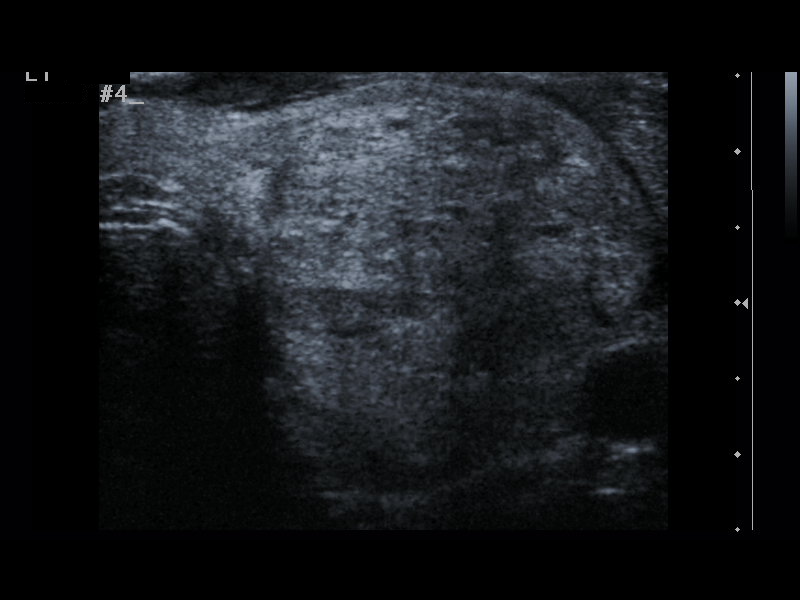
[im 7/9]
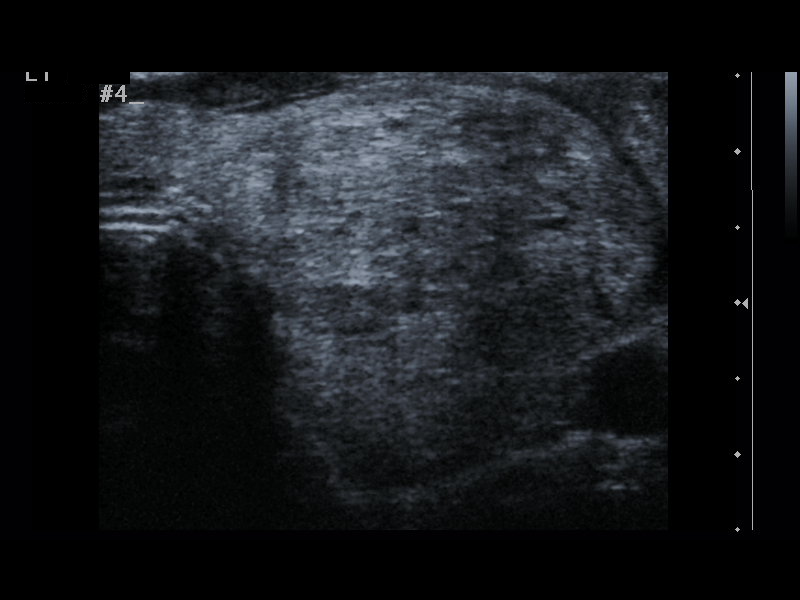
[im 8/9]
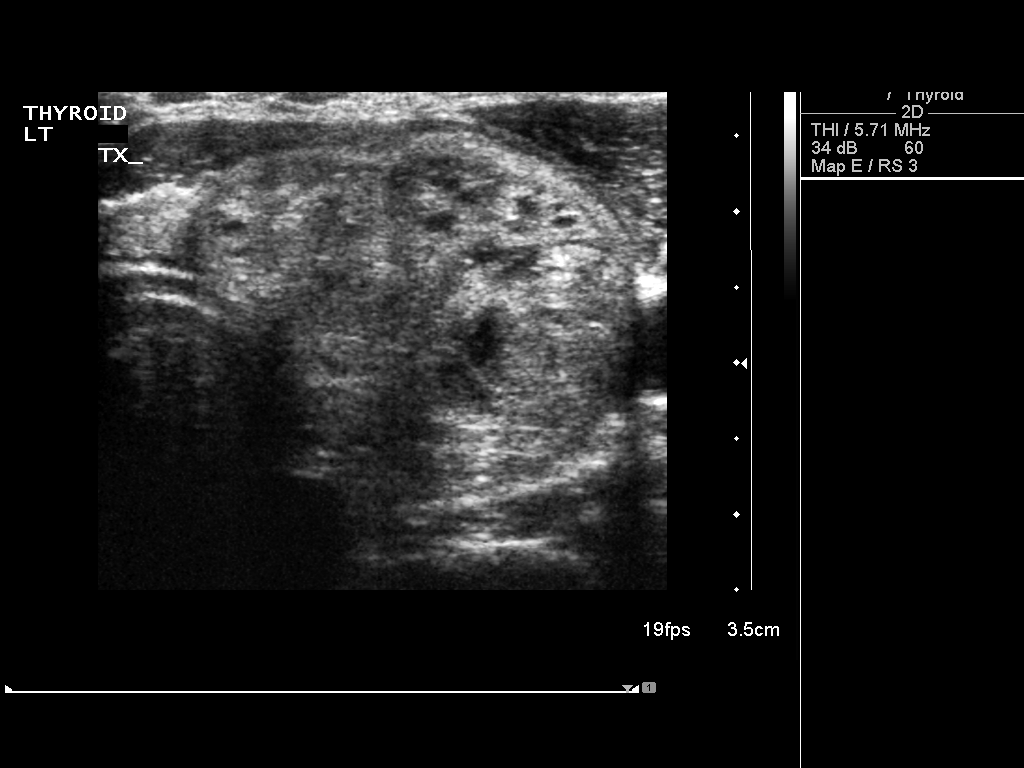
[im 9/9]
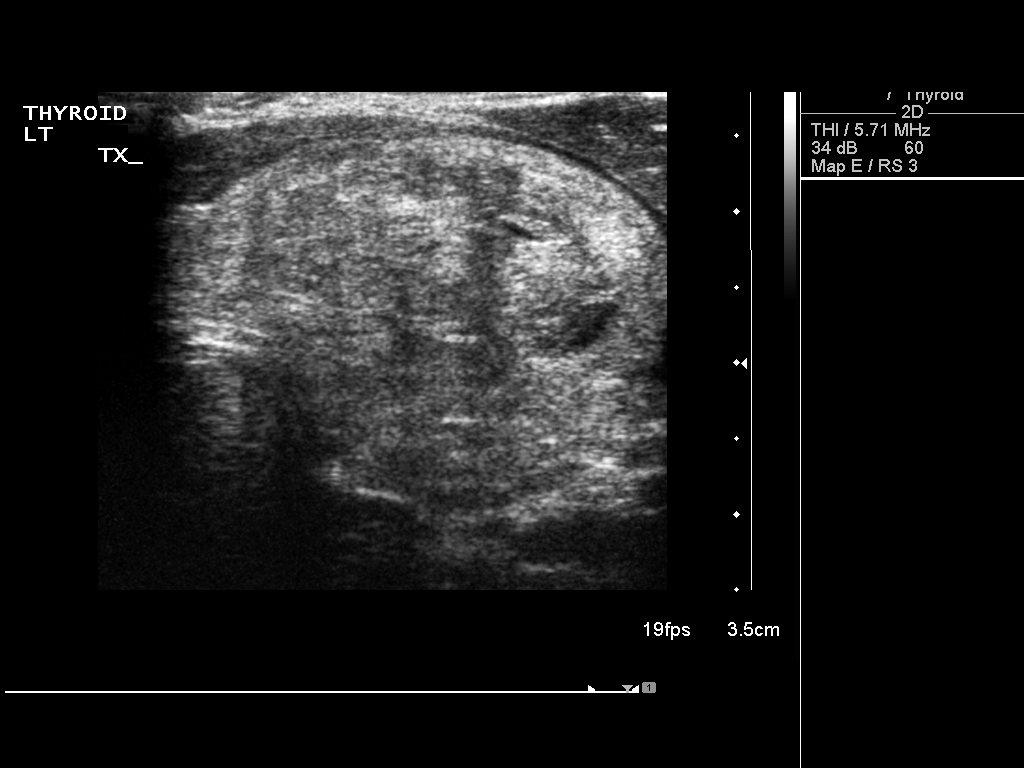

[9 of 9 positions shown; findings below may reference images not displayed]

Thyroid biopsy was thoroughly discussed with the patient and
questions were answered.  The benefits, risks, alternatives, and
complications were also discussed.  The patient understands and
wishes to proceed with the procedure.  Written consent was
obtained.

Ultrasound was performed to localize and mark an adequate site for
the biopsy.  The patient was then prepped and draped in a normal
sterile fashion.  Local anesthesia was provided with 1% lidocaine.
Using direct ultrasound guidance, 4 passes were made using 25 gauge
needles into the nodule within the left lobe of the thyroid.
Ultrasound was used to confirm needle placements on all occasions.
Specimens were sent to Pathology for analysis.

Complications:  None
FINDINGS: The dominant 3.8 cm left thyroid nodule was sampled in
different locations.
IMPRESSION: Ultrasound guided needle aspirate biopsy performed of the dominant
left thyroid nodule.

## 2014-06-02 ENCOUNTER — Other Ambulatory Visit: Payer: Self-pay | Admitting: Cardiovascular Disease

## 2014-10-17 ENCOUNTER — Other Ambulatory Visit: Payer: Self-pay | Admitting: Cardiovascular Disease

## 2014-10-24 ENCOUNTER — Other Ambulatory Visit: Payer: Self-pay | Admitting: Cardiovascular Disease

## 2014-11-24 ENCOUNTER — Other Ambulatory Visit: Payer: Self-pay | Admitting: Cardiovascular Disease

## 2014-11-27 ENCOUNTER — Other Ambulatory Visit: Payer: Self-pay

## 2014-11-27 MED ORDER — AMLODIPINE BESYLATE 2.5 MG PO TABS: 2.5000 mg | ORAL_TABLET | Freq: Two times a day (BID) | ORAL | Status: DC

## 2015-01-26 ENCOUNTER — Other Ambulatory Visit: Payer: Self-pay

## 2015-01-26 MED ORDER — AMLODIPINE BESYLATE 2.5 MG PO TABS: 2.5000 mg | ORAL_TABLET | Freq: Two times a day (BID) | ORAL | Status: DC

## 2015-02-17 NOTE — Progress Notes (Signed)
Chief Complaint  Patient presents with  . Bleeding/Bruising      History of Present Illness: 59 yo female with history of chest pain, HTN, asthma, anxiety who is here today for cardiac follow up. I saw her as a new patient 03/13/13 for a second cardiology opinion. She had been followed by Dr. Bryan Lemma in our practice. She has had frequent episodes of chest pain over the last few years. Cardiac catheterization January 2012 with no evidence of CAD. Echo January 2012 with normal LV function, no valve issues. Treadmill stress test in the [facility] office with 3 mm ST depression at peak exercise. She was seen by Dr. Herbie Baltimore 02/22/13 and discussed repeat cath vs myoview and plans were made for a myoview. She cancelled the myoview. At her visit in my office she described pains in her mid back with radiation into her left shoulder, left arm, left leg and upper neck. She noted chest heaviness with exertion and with stress associated with SOB. Her left leg and arm ached with exertion. This is all worsened when lying on her left side. She felt dizzy much of the time. Overall fatigue. I arranged a cardiac cath on 03/18/13 and she had normal coronary arteries but suspicion for vasospasm given EKG changes. She called in with arm pain above the right radial cath site, red streaking up the right forearm. She was treated with Augmentin for 7 days. She was seen in our office July 2015 by Tereso Newcomer, PA-C for dizziness. The patient felt that Norvasc may be contributing to this. Norvasc was reduced to 2.5 mg daily but increased by patient to 2.5 mg twice daily. She was referred to Neurology but she cancelled this appt.   She is here today for follow up. She is feeling well. No chest pain or SOB. Occasional LE edema in her ankles at the end of long day but resolves with elevating her feet. Her father is now in Hospice due to end stage dementia. She has been under much stress with her family situation.    Primary  Care Physician: Dr. Shary Decamp   Past Medical History  Diagnosis Date  . Hypertension   . Migraines   . Allergy   . Anemia   . Asthma   . Anxiety   . Palpitations   . Hx of echocardiogram 091/2012    relatively normal as well. No significant valvar lesions. Normal Ef.  . H/O exercise stress test 2008; October 2014    was normal in 2008; positive for ischemia with inferior and lateral ST depressions October 2014    Past Surgical History  Procedure Laterality Date  . Cardiac catheterization  04/2010    with no evidence of ischemia or significant coronary disease to speak of, normal LV function with relatively normal EDP.  Marland Kitchen Abdominal exploration surgery  1980  . Nasal sinus surgery    . Left heart catheterization with coronary angiogram N/A 03/18/2013    Procedure: LEFT HEART CATHETERIZATION WITH CORONARY ANGIOGRAM;  Surgeon: Kathleene Hazel, MD;  Location: [facility] CATH LAB;  Service: Cardiovascular;  Laterality: N/A;    Current Outpatient Prescriptions  Medication Sig Dispense Refill  . acetaminophen (TYLENOL) 325 MG tablet Take 500 mg by mouth every 6 (six) hours as needed for mild pain.     Marland Kitchen amLODipine (NORVASC) 2.5 MG tablet Take 1 tablet (2.5 mg total) by mouth 2 (two) times daily. 60 tablet 11  . cholecalciferol (VITAMIN D) 1000 UNITS tablet Take 1,000  Units by mouth 3 (three) times a week.    . fexofenadine (ALLEGRA) 180 MG tablet Take 180 mg by mouth daily.    Marland Kitchen FLUoxetine (PROZAC) 20 MG tablet Take 20 mg by mouth daily.    Marland Kitchen LORazepam (ATIVAN) 1 MG tablet Take 0.5 mg by mouth daily as needed. Take 1/2 to 1 maximum twice daily for anxiety only as needed    . Multiple Vitamins-Minerals (CENTRUM PO) Take 1 tablet by mouth 3 (three) times a week.     . pseudoephedrine-acetaminophen (TYLENOL SINUS) 30-500 MG TABS Take 2 tablets by mouth as needed.     . ranitidine (ZANTAC) 75 MG tablet Take 75 mg by mouth daily.     No current facility-administered medications for this  visit.    Allergies  Allergen Reactions  . Codeine Other (See Comments)    Pounding in head   . Amoxicillin     Diarrhea and vomitting  . Augmentin [Amoxicillin-Pot Clavulanate]     Diarrhea and vomitting  . Bee Venom     Swelling  . Milk-Related Compounds     Diarrhea & gas  . Shellfish Allergy     HIVES, SWELLING, N/V & DIARREHA  . Sulfa Antibiotics   . Sulfur     Social History   Social History  . Marital Status: Single    Spouse Name: N/A  . Number of Children: 0  . Years of Education: N/A   Occupational History  . Retired Chartered loss adjuster    Social History Main Topics  . Smoking status: Never Smoker   . Smokeless tobacco: Not on file  . Alcohol Use: No  . Drug Use: No  . Sexual Activity: Not on file   Other Topics Concern  . Not on file   Social History Narrative   Single woman, who lives alone. 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   She does not smoke or drink alcohol.   She occasionally exercises walking on a treadmill.    Family History  Problem Relation Age of Onset  . Hypertension Mother   . Hypertension Father   . Stroke Maternal Grandmother   . Atrial fibrillation Brother   . Stroke Brother   . Diabetes Maternal Grandmother   . Heart attack Paternal Grandfather   . Cancer Maternal Grandfather   . Cancer Paternal Grandfather   . Diabetes Paternal Grandmother   . Diabetes Paternal Grandfather   . Hypertension    . Hypertension Sister   . Hypertension Brother   . Stroke Father   . Asthma Mother   . Anemia Mother     Review of Systems:  As stated in the HPI and otherwise negative.   BP 122/82 mmHg  Pulse 63  Ht  (1.549 m)  Wt 157 lb (71.215 kg)  BMI 29.68 kg/m2  LMP 11/16/2009  Physical Examination: General: Well developed, well nourished, NAD HEENT: OP clear, mucus membranes moist SKIN: warm, dry. No rashes. Neuro:  No focal deficits Musculoskeletal: Muscle strength 5/5 all ext Psychiatric: Mood and affect normal Neck: No JVD, no carotid bruits, no thyromegaly, no lymphadenopathy. Lungs:Clear bilaterally, no wheezes, rhonci, crackles Cardiovascular: Regular rate and rhythm. No murmurs, gallops or rubs. Abdomen:Soft. Bowel sounds present. Non-tender.  Extremities: No lower extremity edema. Pulses are 2 + in the bilateral DP/PT. Right arm without bruising. Ulnar and radial pulses are 2+.   Cardiac  cath 03/18/13: Left main: No obstructive disease.  Left Anterior Descending Artery: Large caliber vessel that courses to the apex. There are several small caliber diagonal branches. No obstructive disease.  Circumflex Artery: Large caliber vessel with large obtuse marginal branch. No obstructive disease.  Right Coronary Artery: Large dominant vessel with no obstructive disease.  Left Ventricular Angiogram: LVEF=65%.  Impression:  1. No angiographic evidence of CAD  2. Normal LV systolic function  3. Chest pain, cannot fully exclude coronary vasospasm with ST depression on treadmill exercise test.   EKG:  EKG is ordered today. The ekg ordered today demonstrates NSR, rate 63 bpm. Poor R wave progression.   Recent Labs: No results found for requested labs within last 365 days.   Lipid Panel No results found for: CHOL, TRIG, HDL, CHOLHDL, VLDL, LDLCALC, LDLDIRECT   Wt Readings from Last 3 Encounters:  02/18/15 157 lb (71.215 kg)  11/12/13 157 lb (71.215 kg)  04/24/13 158 lb (71.668 kg)     Other studies Reviewed: Additional studies/ records that were reviewed today include: . 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 Her chest pain could be due to coronary vasospasm. Will continue Norvasc 2.5 mg po BID.   Current medicines are reviewed at length with the patient today.  The patient does not have concerns  regarding medicines.  The following changes have been made:  no change  Labs/ tests ordered today include:   Orders Placed This Encounter  Procedures  . Flu Vaccine QUAD 36+ mos IM  . EKG 12-Lead    Disposition:   FU with me in 12  months  Signed, Verne Carrowhristopher McAlhany, MD 02/18/2015 10:26 AM    [facility] [address], [place], [place]  [address] Phone: [phone]; Fax: [phone]

## 2015-02-18 ENCOUNTER — Encounter: Payer: Self-pay | Admitting: Cardiovascular Disease

## 2015-02-18 ENCOUNTER — Ambulatory Visit (INDEPENDENT_AMBULATORY_CARE_PROVIDER_SITE_OTHER): Payer: BC Managed Care – PPO | Admitting: Cardiovascular Disease

## 2015-02-18 VITALS — BP 122/82 | HR 63 | Ht 61.0 in | Wt 157.0 lb

## 2015-02-18 DIAGNOSIS — I201 Angina pectoris with documented spasm: Secondary | ICD-10-CM | POA: Diagnosis not present

## 2015-02-18 DIAGNOSIS — Z23 Encounter for immunization: Secondary | ICD-10-CM | POA: Diagnosis not present

## 2015-02-18 MED ORDER — AMLODIPINE BESYLATE 2.5 MG PO TABS: 2.5000 mg | ORAL_TABLET | Freq: Two times a day (BID) | ORAL | Status: DC

## 2015-02-18 NOTE — Patient Instructions (Signed)

## 2015-07-03 ENCOUNTER — Telehealth: Payer: Self-pay

## 2015-07-06 NOTE — Telephone Encounter (Signed)
PRIOR AUTH FOR AMLODIPINE 2.5MG  BID SENT TO [facility].

## 2015-07-07 ENCOUNTER — Telehealth: Payer: Self-pay

## 2015-07-07 ENCOUNTER — Other Ambulatory Visit: Payer: Self-pay

## 2015-07-07 MED ORDER — AMLODIPINE BESYLATE 5 MG PO TABS: 5.0000 mg | ORAL_TABLET | Freq: Every day | ORAL | Status: DC

## 2015-07-07 NOTE — Telephone Encounter (Signed)
Amlodipine 2.5mg  denied by [facility]. She must use 5mg  and break in half to take 1/2 tab bid. I advised her to get a pill cutter.

## 2015-07-11 ENCOUNTER — Ambulatory Visit (INDEPENDENT_AMBULATORY_CARE_PROVIDER_SITE_OTHER): Payer: BC Managed Care – PPO | Admitting: Family Medicine

## 2015-07-11 ENCOUNTER — Ambulatory Visit (INDEPENDENT_AMBULATORY_CARE_PROVIDER_SITE_OTHER): Payer: BC Managed Care – PPO

## 2015-07-11 VITALS — BP 116/68 | HR 69 | Temp 98.1°F | Resp 16 | Ht 61.0 in | Wt 164.0 lb

## 2015-07-11 DIAGNOSIS — J029 Acute pharyngitis, unspecified: Secondary | ICD-10-CM | POA: Diagnosis not present

## 2015-07-11 DIAGNOSIS — R42 Dizziness and giddiness: Secondary | ICD-10-CM | POA: Diagnosis not present

## 2015-07-11 DIAGNOSIS — R079 Chest pain, unspecified: Secondary | ICD-10-CM

## 2015-07-11 DIAGNOSIS — R05 Cough: Secondary | ICD-10-CM | POA: Diagnosis not present

## 2015-07-11 DIAGNOSIS — Z2089 Contact with and (suspected) exposure to other communicable diseases: Secondary | ICD-10-CM

## 2015-07-11 DIAGNOSIS — Z20818 Contact with and (suspected) exposure to other bacterial communicable diseases: Secondary | ICD-10-CM

## 2015-07-11 DIAGNOSIS — R059 Cough, unspecified: Secondary | ICD-10-CM

## 2015-07-11 LAB — POCT CBC
Granulocyte percent: 53 %G (ref 37–80)
HEMATOCRIT: 34.4 % — AB (ref 37.7–47.9)
Hemoglobin: 12.4 g/dL (ref 12.2–16.2)
Lymph, poc: 1.9 (ref 0.6–3.4)
MCH: 30.8 pg (ref 27–31.2)
MCHC: 36.1 g/dL — AB (ref 31.8–35.4)
MCV: 85.2 fL (ref 80–97)
MID (CBC): 0.4 (ref 0–0.9)
MPV: 6.2 fL (ref 0–99.8)
POC GRANULOCYTE: 2.5 (ref 2–6.9)
POC LYMPH PERCENT: 38.7 %L (ref 10–50)
POC MID %: 8.3 % (ref 0–12)
Platelet Count, POC: 321 10*3/uL (ref 142–424)
RBC: 4.04 M/uL (ref 4.04–5.48)
RDW, POC: 14.3 %
WBC: 4.8 10*3/uL (ref 4.6–10.2)

## 2015-07-11 LAB — GLUCOSE, POCT (MANUAL RESULT ENTRY): POC Glucose: 88 mg/dl (ref 70–99)

## 2015-07-11 LAB — POCT RAPID STREP A (OFFICE): Rapid Strep A Screen: NEGATIVE

## 2015-07-11 NOTE — Addendum Note (Signed)
Addended by: Maurene CapesPOTTS, Reford Olliff M on: 07/11/2015 10:01 AM   Modules accepted: Level of Service

## 2015-07-11 NOTE — Progress Notes (Addendum)
Subjective:    Patient ID: Bianca [name], female    DOB: 1955/11/27, 60 y.o.   MRN: [id number] By signing my name below, I, Bianca [name], attest that this documentation has been prepared under the direction and in the presence of Bianca Staggers, MD. Electronically Signed: Javier [name], ER Scribe. 07/11/2015. 9:23 AM.  Chief Complaint  Patient presents with  . Chest Pain    congestion  . Sinus Problem    HPI HPI Comments: Bianca [name] is a 60 y.o. female with a past hx of anemia who presents to [facility] complaining of chest pain, chest tightness, SOB, rhinorrhea, fatigue, sore throat and sinus pressure for the last three days. She also endorses associated cough with lightheadedness, and states that when she coughs the light headedness gets worse. Her light headedness started yesterday. She has never seen a neurologist. She has no past hx of blocked arteries or MI, though she has had significant cardiologist workup. She denies fever or chills. She was exposed to marijuana fumes at work two days ago, and her cough worstened after being exposed to those fumes. 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 Cardiologist Dr. Clifton James with last visit Nov. 2015. Takes norvasc 2.5 for this condition. She is on doxycycline 100mg , once per day for acne.   She works as a Runner, broadcasting/film/video.   Patient Active Problem List   Diagnosis Date Noted  . Chest pain 03/13/2013  . Abnormal stress ECG with treadmill 02/24/2013  . HTN (hypertension) 12/24/2011  . Heart palpitations 12/24/2011  . Awareness of heartbeats 11/30/2011   Past Medical History  Diagnosis Date  . Hypertension   . Migraines   . Allergy   . Anemia   . Asthma   . Anxiety   . Palpitations   . Hx of echocardiogram 091/2012    relatively normal as well. No significant valvar lesions. Normal Ef.  . H/O exercise stress test  2008; October 2014    was normal in 2008; positive for ischemia with inferior and lateral ST depressions October 2014   Past Surgical History  Procedure Laterality Date  . Cardiac catheterization  04/2010    with no evidence of ischemia or significant coronary disease to speak of, normal LV function with relatively normal EDP.  Marland Kitchen Abdominal exploration surgery  1980  . Nasal sinus surgery    . Left heart catheterization with coronary angiogram N/A 03/18/2013    Procedure: LEFT HEART CATHETERIZATION WITH CORONARY ANGIOGRAM;  Surgeon: Kathleene Hazel, MD;  Location: [facility] CATH LAB;  Service: Cardiovascular;  Laterality: N/A;   Allergies  Allergen Reactions  . Codeine Other (See Comments)    Pounding in head   . Amoxicillin     Diarrhea and vomitting  . Augmentin [Amoxicillin-Pot Clavulanate]     Diarrhea and vomitting  . Bee Venom     Swelling  . Milk-Related Compounds     Diarrhea & gas  . Shellfish Allergy     HIVES, SWELLING, N/V & DIARREHA  . Sulfa Antibiotics   . Sulfur    Prior to Admission medications   Medication Sig Start Date End Date Taking? Authorizing Provider  acetaminophen (TYLENOL) 325 MG tablet Take 500 mg by mouth every 6 (six) hours as needed for mild pain.     Historical Provider, MD  amLODipine (NORVASC) 5 MG tablet Take 1 tablet (  5 mg total) by mouth daily. Take 1/2 tablet bid 07/07/15   Kathleene Hazel, MD  cholecalciferol (VITAMIN D) 1000 UNITS tablet Take 1,000 Units by mouth 3 (three) times a week.    Historical Provider, MD  fexofenadine (ALLEGRA) 180 MG tablet Take 180 mg by mouth daily.    Historical Provider, MD  FLUoxetine (PROZAC) 20 MG tablet Take 20 mg by mouth daily.    Historical Provider, MD  LORazepam (ATIVAN) 1 MG tablet Take 0.5 mg by mouth daily as needed. Take 1/2 to 1 maximum twice daily for anxiety only as needed 12/24/11   Peyton Najjar, MD  Multiple Vitamins-Minerals (CENTRUM PO) Take 1 tablet by mouth 3 (three) times a week.      Historical Provider, MD  pseudoephedrine-acetaminophen (TYLENOL SINUS) 30-500 MG TABS Take 2 tablets by mouth as needed.     Historical Provider, MD  ranitidine (ZANTAC) 75 MG tablet Take 75 mg by mouth daily.    Historical Provider, MD   Social History   Social History  . Marital Status: Single    Spouse Name: N/A  . Number of Children: 0  . Years of Education: N/A   Occupational History  . Retired Chartered loss adjuster    Social History Main Topics  . Smoking status: Never Smoker   . Smokeless tobacco: Never Used  . Alcohol Use: No  . Drug Use: No  . Sexual Activity: Not on file   Other Topics Concern  . Not on file   Social History Narrative   Single woman, who lives alone. 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   She does not smoke or drink alcohol.   She occasionally exercises walking on a treadmill.    Review of Systems  HENT: Positive for congestion, sinus pressure and sore throat.   Respiratory: Positive for cough and chest tightness.   Cardiovascular: Positive for chest pain.  Neurological: Positive for light-headedness and headaches.        Objective:  BP 116/68 mmHg  Pulse 69  Temp(Src) 98.1 F (36.7 C) (Oral)  Resp 16  Ht 5\' 1"  (1.549 m)  Wt 164 lb (74.39 kg)  BMI 31.00 kg/m2  SpO2 98%  LMP 11/16/2009  Physical Exam  Constitutional: She is oriented to person, place, and time. She appears well-developed and well-nourished. No distress.  HENT:  Head: Normocephalic and atraumatic.  Right Ear: Hearing, tympanic membrane, external ear and ear canal normal.  Left Ear: Hearing, tympanic membrane, external ear and ear canal normal.  Nose: Nose normal.  Mouth/Throat: Oropharynx is clear and moist. No oropharyngeal exudate.  Eyes: Conjunctivae and EOM are normal. Pupils are equal, round, and reactive to light.  Neck: Neck supple.  Cardiovascular: Normal  rate, regular rhythm, normal heart sounds and intact distal pulses.   No murmur heard. Pulmonary/Chest: Effort normal and breath sounds normal. No respiratory distress. She has no wheezes. She has no rhonchi.  Musculoskeletal: Normal range of motion.  Sore reproducible discomfort along the sternum.   Neurological: She is alert and oriented to person, place, and time. Coordination normal.  Romberg negative. Finger to nose normal. Non focal neuro exam.   Skin: Skin is warm and dry. No rash noted. She is not diaphoretic.  Psychiatric: She has a normal mood and affect. Her behavior is normal.  Nursing note and vitals reviewed.  Results for orders placed or performed  in visit on 07/11/15  POCT CBC  Result Value Ref Range   WBC 4.8 4.6 - 10.2 K/uL   Lymph, poc 1.9 0.6 - 3.4   POC LYMPH PERCENT 38.7 10 - 50 %L   MID (cbc) 0.4 0 - 0.9   POC MID % 8.3 0 - 12 %M   POC Granulocyte 2.5 2 - 6.9   Granulocyte percent 53.0 37 - 80 %G   RBC 4.04 4.04 - 5.48 M/uL   Hemoglobin 12.4 12.2 - 16.2 g/dL   HCT, POC 78.2 (A) 95.6 - 47.9 %   MCV 85.2 80 - 97 fL   MCH, POC 30.8 27 - 31.2 pg   MCHC 36.1 (A) 31.8 - 35.4 g/dL   RDW, POC 21.3 %   Platelet Count, POC 321 142 - 424 K/uL   MPV 6.2 0 - 99.8 fL  POCT glucose (manual entry)  Result Value Ref Range   POC Glucose 88 70 - 99 mg/dl  POCT rapid strep A  Result Value Ref Range   Rapid Strep A Screen Negative Negative   EKG: Sinus rhythm, rate 57, no apparent changes from November 2016 EKG. No results found.     Assessment & Plan:   Bianca [name] is a 60 y.o. female Sore throat - Plan: POCT rapid strep A, Culture, Group A Strep Exposure to strep throat - Plan: POCT rapid strep A  - Exposure to strep throat, but no concerning findings on exam, negative rapid strep, check for culture. Symptomatic care. RTC precautions.  Chest pain, unspecified chest pain type - Plan: EKG 12-Lead, DG Chest 2 View  - History of suspected coronary  vasospasm,concerning findings on EKG, reproducible on exam, suspect chest wall pain or soreness with congestion/cough. Symptomatic care with Mucinex, Tylenol or Advil as needed, RTC/ER precautions.  Cough - Plan: DG Chest 2 View, POCT CBC  - Likely viral URI as discussed above. Coricidin, or Mucinex as needed.  RTC precautions  Lightheadedness - Plan: POCT CBC, POCT glucose (manual entry)  - Reassuring CBC, EKG without significant changes from previous. Increase fluids, RTC precautions   Meds ordered this encounter  Medications  . hydrochlorothiazide (HYDRODIURIL) 25 MG tablet    Sig: Take by mouth.  . metoprolol (LOPRESSOR) 50 MG tablet    Sig: Take by mouth.  . Multiple Vitamin (MULTIVITAMIN) capsule    Sig: Take by mouth.  Marland Kitchen amLODipine (NORVASC) 2.5 MG tablet    Sig:     Refill:  11  . DISCONTD: cefdinir (OMNICEF) 300 MG capsule    Sig: TK ONE C PO  Q 12 H FOR 10 DAYS    Refill:  0  . doxycycline (VIBRAMYCIN) 100 MG capsule    Sig: TK ONE C PO  BID WF    Refill:  0  . fluconazole (DIFLUCAN) 200 MG tablet    Sig: TK 1 T PO D    Refill:  0  . FLUoxetine (PROZAC) 20 MG capsule    Sig: TK ONE C PO QAM    Refill:  1  . metroNIDAZOLE (METROGEL) 0.75 % gel    Sig: APP AA BID    Refill:  6   Patient Instructions  Your vital signs and exam are encouraging today. I suspect you have a virus that is causing the sore throat, cough, and chest wall pain may be from coughing. See information on this below. Drink plenty of fluids, Mucinex or Mucinex DM as needed for cough, Cepacol or sore  throat lozenges as needed for sore throat. Tylenol or Advil if needed for the chest wall pain. If your chest pain worsens or changes, go to the emergency room as we discussed, but at the current time it does not appear to be cardiac.   Return to the clinic or go to the nearest emergency room if any of your symptoms worsen or new symptoms occur.  Chest Wall Pain Chest wall pain is pain in or around the  bones and muscles of your chest. Sometimes, an injury causes this pain. Sometimes, the cause may not be known. This pain may take several weeks or longer to get better. HOME CARE INSTRUCTIONS  Pay attention to any changes in your symptoms. Take these actions to help with your pain:   Rest as told by your health care provider.   Avoid activities that cause pain. These include any activities that use your chest muscles or your abdominal and side muscles to lift heavy items.   If directed, apply ice to the painful area:  Put ice in a plastic bag.  Place a towel between your skin and the bag.  Leave the ice on for 20 minutes, 2-3 times per day.  Take over-the-counter and prescription medicines only as told by your health care provider.  Do not use tobacco products, including cigarettes, chewing tobacco, and e-cigarettes. If you need help quitting, ask your health care provider.  Keep all follow-up visits as told by your health care provider. This is important. SEEK MEDICAL CARE IF:  You have a fever.  Your chest pain becomes worse.  You have new symptoms. SEEK IMMEDIATE MEDICAL CARE IF:  You have nausea or vomiting.  You feel sweaty or light-headed.  You have a cough with phlegm (sputum) or you cough up blood.  You develop shortness of breath.   This information is not intended to replace advice given to you by your health care provider. Make sure you discuss any questions you have with your health care provider.   Document Released: 04/04/2005 Document Revised: 12/24/2014 Document Reviewed: 06/30/2014 Elsevier Interactive Patient Education 2016 Elsevier Inc.  Upper Respiratory Infection, Adult Most upper respiratory infections (URIs) are a viral infection of the air passages leading to the lungs. A URI affects the nose, throat, and upper air passages. The most common type of URI is nasopharyngitis and is typically referred to as "the common cold." URIs run their course  and usually go away on their own. Most of the time, a URI does not require medical attention, but sometimes a bacterial infection in the upper airways can follow a viral infection. This is called a secondary infection. Sinus and middle ear infections are common types of secondary upper respiratory infections. Bacterial pneumonia can also complicate a URI. A URI can worsen asthma and chronic obstructive pulmonary disease (COPD). Sometimes, these complications can require emergency medical care and may be life threatening.  CAUSES Almost all URIs are caused by viruses. A virus is a type of germ and can spread from one person to another.  RISKS FACTORS You may be at risk for a URI if:   You smoke.   You have chronic heart or lung disease.  You have a weakened defense (immune) system.   You are very young or very old.   You have nasal allergies or asthma.  You work in crowded or poorly ventilated areas.  You work in health care facilities or schools. SIGNS AND SYMPTOMS  Symptoms typically develop 2-3 days after  you come in contact with a cold virus. Most viral URIs last 7-10 days. However, viral URIs from the influenza virus (flu virus) can last 14-18 days and are typically more severe. Symptoms may include:   Runny or stuffy (congested) nose.   Sneezing.   Cough.   Sore throat.   Headache.   Fatigue.   Fever.   Loss of appetite.   Pain in your forehead, behind your eyes, and over your cheekbones (sinus pain).  Muscle aches.  DIAGNOSIS  Your health care provider may diagnose a URI by:  Physical exam.  Tests to check that your symptoms are not due to another condition such as:  Strep throat.  Sinusitis.  Pneumonia.  Asthma. TREATMENT  A URI goes away on its own with time. It cannot be cured with medicines, but medicines may be prescribed or recommended to relieve symptoms. Medicines may help:  Reduce your fever.  Reduce your cough.  Relieve nasal  congestion. HOME CARE INSTRUCTIONS   Take medicines only as directed by your health care provider.   Gargle warm saltwater or take cough drops to comfort your throat as directed by your health care provider.  Use a warm mist humidifier or inhale steam from a shower to increase air moisture. This may make it easier to breathe.  Drink enough fluid to keep your urine clear or pale yellow.   Eat soups and other clear broths and maintain good nutrition.   Rest as needed.   Return to work when your temperature has returned to normal or as your health care provider advises. You may need to stay home longer to avoid infecting others. You can also use a face mask and careful hand washing to prevent spread of the virus.  Increase the usage of your inhaler if you have asthma.   Do not use any tobacco products, including cigarettes, chewing tobacco, or electronic cigarettes. If you need help quitting, ask your health care provider. PREVENTION  The best way to protect yourself from getting a cold is to practice good hygiene.   Avoid oral or hand contact with people with cold symptoms.   Wash your hands often if contact occurs.  There is no clear evidence that vitamin C, vitamin E, echinacea, or exercise reduces the chance of developing a cold. However, it is always recommended to get plenty of rest, exercise, and practice good nutrition.  SEEK MEDICAL CARE IF:   You are getting worse rather than better.   Your symptoms are not controlled by medicine.   You have chills.  You have worsening shortness of breath.  You have brown or red mucus.  You have yellow or brown nasal discharge.  You have pain in your face, especially when you bend forward.  You have a fever.  You have swollen neck glands.  You have pain while swallowing.  You have white areas in the back of your throat. SEEK IMMEDIATE MEDICAL CARE IF:   You have severe or persistent:  Headache.  Ear  pain.  Sinus pain.  Chest pain.  You have chronic lung disease and any of the following:  Wheezing.  Prolonged cough.  Coughing up blood.  A change in your usual mucus.  You have a stiff neck.  You have changes in your:  Vision.  Hearing.  Thinking.  Mood. MAKE SURE YOU:   Understand these instructions.  Will watch your condition.  Will get help right away if you are not doing well or get worse.  This information is not intended to replace advice given to you by your health care provider. Make sure you discuss any questions you have with your health care provider.   Document Released: 09/28/2000 Document Revised: 08/19/2014 Document Reviewed: 07/10/2013 Elsevier Interactive Patient Education [employer]       I personally performed the services described in this documentation, which was scribed in my presence. The recorded information has been reviewed and considered, and addended by me as needed.

## 2015-07-11 NOTE — Patient Instructions (Addendum)
Your vital signs and exam are encouraging today. I suspect you have a virus that is causing the sore throat, cough, and chest wall pain may be from coughing. See information on this below. Drink plenty of fluids, Mucinex or Mucinex DM as needed for cough, Cepacol or sore throat lozenges as needed for sore throat. Tylenol or Advil if needed for the chest wall pain. If your chest pain worsens or changes, go to the emergency room as we discussed, but at the current time it does not appear to be cardiac.   Return to the clinic or go to the nearest emergency room if any of your symptoms worsen or new symptoms occur.  Chest Wall Pain Chest wall pain is pain in or around the bones and muscles of your chest. Sometimes, an injury causes this pain. Sometimes, the cause may not be known. This pain may take several weeks or longer to get better. HOME CARE INSTRUCTIONS  Pay attention to any changes in your symptoms. Take these actions to help with your pain:   Rest as told by your health care provider.   Avoid activities that cause pain. These include any activities that use your chest muscles or your abdominal and side muscles to lift heavy items.   If directed, apply ice to the painful area:  Put ice in a plastic bag.  Place a towel between your skin and the bag.  Leave the ice on for 20 minutes, 2-3 times per day.  Take over-the-counter and prescription medicines only as told by your health care provider.  Do not use tobacco products, including cigarettes, chewing tobacco, and e-cigarettes. If you need help quitting, ask your health care provider.  Keep all follow-up visits as told by your health care provider. This is important. SEEK MEDICAL CARE IF:  You have a fever.  Your chest pain becomes worse.  You have new symptoms. SEEK IMMEDIATE MEDICAL CARE IF:  You have nausea or vomiting.  You feel sweaty or light-headed.  You have a cough with phlegm (sputum) or you cough up  blood.  You develop shortness of breath.   This information is not intended to replace advice given to you by your health care provider. Make sure you discuss any questions you have with your health care provider.   Document Released: 04/04/2005 Document Revised: 12/24/2014 Document Reviewed: 06/30/2014 Elsevier Interactive Patient Education 2016 Elsevier Inc.  Upper Respiratory Infection, Adult Most upper respiratory infections (URIs) are a viral infection of the air passages leading to the lungs. A URI affects the nose, throat, and upper air passages. The most common type of URI is nasopharyngitis and is typically referred to as "the common cold." URIs run their course and usually go away on their own. Most of the time, a URI does not require medical attention, but sometimes a bacterial infection in the upper airways can follow a viral infection. This is called a secondary infection. Sinus and middle ear infections are common types of secondary upper respiratory infections. Bacterial pneumonia can also complicate a URI. A URI can worsen asthma and chronic obstructive pulmonary disease (COPD). Sometimes, these complications can require emergency medical care and may be life threatening.  CAUSES Almost all URIs are caused by viruses. A virus is a type of germ and can spread from one person to another.  RISKS FACTORS You may be at risk for a URI if:   You smoke.   You have chronic heart or lung disease.  You have a weakened  defense (immune) system.   You are very young or very old.   You have nasal allergies or asthma.  You work in crowded or poorly ventilated areas.  You work in health care facilities or schools. SIGNS AND SYMPTOMS  Symptoms typically develop 2-3 days after you come in contact with a cold virus. Most viral URIs last 7-10 days. However, viral URIs from the influenza virus (flu virus) can last 14-18 days and are typically more severe. Symptoms may include:   Runny  or stuffy (congested) nose.   Sneezing.   Cough.   Sore throat.   Headache.   Fatigue.   Fever.   Loss of appetite.   Pain in your forehead, behind your eyes, and over your cheekbones (sinus pain).  Muscle aches.  DIAGNOSIS  Your health care provider may diagnose a URI by:  Physical exam.  Tests to check that your symptoms are not due to another condition such as:  Strep throat.  Sinusitis.  Pneumonia.  Asthma. TREATMENT  A URI goes away on its own with time. It cannot be cured with medicines, but medicines may be prescribed or recommended to relieve symptoms. Medicines may help:  Reduce your fever.  Reduce your cough.  Relieve nasal congestion. HOME CARE INSTRUCTIONS   Take medicines only as directed by your health care provider.   Gargle warm saltwater or take cough drops to comfort your throat as directed by your health care provider.  Use a warm mist humidifier or inhale steam from a shower to increase air moisture. This may make it easier to breathe.  Drink enough fluid to keep your urine clear or pale yellow.   Eat soups and other clear broths and maintain good nutrition.   Rest as needed.   Return to work when your temperature has returned to normal or as your health care provider advises. You may need to stay home longer to avoid infecting others. You can also use a face mask and careful hand washing to prevent spread of the virus.  Increase the usage of your inhaler if you have asthma.   Do not use any tobacco products, including cigarettes, chewing tobacco, or electronic cigarettes. If you need help quitting, ask your health care provider. PREVENTION  The best way to protect yourself from getting a cold is to practice good hygiene.   Avoid oral or hand contact with people with cold symptoms.   Wash your hands often if contact occurs.  There is no clear evidence that vitamin C, vitamin E, echinacea, or exercise reduces the  chance of developing a cold. However, it is always recommended to get plenty of rest, exercise, and practice good nutrition.  SEEK MEDICAL CARE IF:   You are getting worse rather than better.   Your symptoms are not controlled by medicine.   You have chills.  You have worsening shortness of breath.  You have brown or red mucus.  You have yellow or brown nasal discharge.  You have pain in your face, especially when you bend forward.  You have a fever.  You have swollen neck glands.  You have pain while swallowing.  You have white areas in the back of your throat. SEEK IMMEDIATE MEDICAL CARE IF:   You have severe or persistent:  Headache.  Ear pain.  Sinus pain.  Chest pain.  You have chronic lung disease and any of the following:  Wheezing.  Prolonged cough.  Coughing up blood.  A change in your usual mucus.  You have a stiff neck.  You have changes in your:  Vision.  Hearing.  Thinking.  Mood. MAKE SURE YOU:   Understand these instructions.  Will watch your condition.  Will get help right away if you are not doing well or get worse.   This information is not intended to replace advice given to you by your health care provider. Make sure you discuss any questions you have with your health care provider.   Document Released: 09/28/2000 Document Revised: 08/19/2014 Document Reviewed: 07/10/2013 Elsevier Interactive Patient Education [employer] Inc[employer]

## 2015-07-12 LAB — CULTURE, GROUP A STREP: Organism ID, Bacteria: NORMAL

## 2015-10-15 ENCOUNTER — Encounter (HOSPITAL_COMMUNITY): Payer: Self-pay

## 2015-10-15 ENCOUNTER — Emergency Department (HOSPITAL_COMMUNITY): Payer: BC Managed Care – PPO

## 2015-10-15 ENCOUNTER — Emergency Department (HOSPITAL_COMMUNITY)
Admission: EM | Admit: 2015-10-15 | Discharge: 2015-10-15 | Disposition: A | Discharge status: Home / Self Care | Payer: BC Managed Care – PPO | Attending: Emergency Medicine | Admitting: Emergency Medicine

## 2015-10-15 DIAGNOSIS — J45909 Unspecified asthma, uncomplicated: Secondary | ICD-10-CM | POA: Insufficient documentation

## 2015-10-15 DIAGNOSIS — K6289 Other specified diseases of anus and rectum: Secondary | ICD-10-CM | POA: Diagnosis not present

## 2015-10-15 DIAGNOSIS — I10 Essential (primary) hypertension: Secondary | ICD-10-CM | POA: Insufficient documentation

## 2015-10-15 DIAGNOSIS — Z79899 Other long term (current) drug therapy: Secondary | ICD-10-CM | POA: Insufficient documentation

## 2015-10-15 DIAGNOSIS — R42 Dizziness and giddiness: Secondary | ICD-10-CM | POA: Insufficient documentation

## 2015-10-15 LAB — CBC
HCT: 37.7 % (ref 36.0–46.0)
Hemoglobin: 13.6 g/dL (ref 12.0–15.0)
MCH: 28.7 pg (ref 26.0–34.0)
MCHC: 36.1 g/dL — AB (ref 30.0–36.0)
MCV: 79.5 fL (ref 78.0–100.0)
Platelets: 312 10*3/uL (ref 150–400)
RBC: 4.74 MIL/uL (ref 3.87–5.11)
RDW: 14 % (ref 11.5–15.5)
WBC: 5.3 10*3/uL (ref 4.0–10.5)

## 2015-10-15 LAB — I-STAT TROPONIN, ED: Troponin i, poc: 0 ng/mL (ref 0.00–0.08)

## 2015-10-15 LAB — URINALYSIS, ROUTINE W REFLEX MICROSCOPIC
BILIRUBIN URINE: NEGATIVE
Glucose, UA: NEGATIVE mg/dL
KETONES UR: NEGATIVE mg/dL
LEUKOCYTES UA: NEGATIVE
NITRITE: NEGATIVE
Protein, ur: NEGATIVE mg/dL
SPECIFIC GRAVITY, URINE: 1.005 (ref 1.005–1.030)
pH: 6.5 (ref 5.0–8.0)

## 2015-10-15 LAB — BASIC METABOLIC PANEL
ANION GAP: 8 (ref 5–15)
BUN: 9 mg/dL (ref 6–20)
CALCIUM: 10 mg/dL (ref 8.9–10.3)
CO2: 23 mmol/L (ref 22–32)
CREATININE: 0.72 mg/dL (ref 0.44–1.00)
Chloride: 109 mmol/L (ref 101–111)
GFR calc Af Amer: >60 mL/min (ref >60)
GLUCOSE: 121 mg/dL — AB (ref 65–99)
Potassium: 3.5 mmol/L (ref 3.5–5.1)
Sodium: 140 mmol/L (ref 135–145)

## 2015-10-15 LAB — URINE MICROSCOPIC-ADD ON

## 2015-10-15 LAB — CBG MONITORING, ED: GLUCOSE-CAPILLARY: 121 mg/dL — AB (ref 65–99)

## 2015-10-15 MED ORDER — IOPAMIDOL (ISOVUE-300) INJECTION 61%
100.0000 mL | Freq: Once | INTRAVENOUS | Status: AC | PRN
  Administered 2015-10-15: 100 mL via INTRAVENOUS

## 2015-10-15 MED ORDER — SODIUM CHLORIDE 0.9 % IV BOLUS (SEPSIS)
1000.0000 mL | Freq: Once | INTRAVENOUS | Status: AC
  Administered 2015-10-15: 1000 mL via INTRAVENOUS

## 2015-10-15 MED ORDER — DIATRIZOATE MEGLUMINE & SODIUM 66-10 % PO SOLN
15.0000 mL | Freq: Once | ORAL | Status: AC
  Administered 2015-10-15: 15 mL via ORAL

## 2015-10-15 NOTE — ED Notes (Signed)
CBG= 121

## 2015-10-15 NOTE — ED Provider Notes (Signed)
CSN: [id number]     Arrival date & time 10/15/15  1352 History   First MD Initiated Contact with Patient 10/15/15 1547     Chief Complaint  Patient presents with  . Dizziness     HPI  Pt with multiple complaints.  She has had rectal pain since doing sit ups on Monday, 4 days ago.  Seen by GI yesterday and reduced, and referred to Dr. Byrd Hesselbach at [facility] in August.  Has "ointment".  Today with chest congestion this am and dizzy/lightheaded.  No vertigo.  Still with rectal pain, but does not feel prolapsed.  Took nap and woke up "more dizzy". Felt like she could fall, so called 911.   No recent N?V D, no fever or chills.  Feels "need to cough" but no CP.  H/o anemia, no recent GI bleeding.  No new meds, x "Ointment".   Past Medical History  Diagnosis Date  . Hypertension   . Migraines   . Allergy   . Anemia   . Asthma   . Anxiety   . Palpitations   . Hx of echocardiogram 091/2012    relatively normal as well. No significant valvar lesions. Normal Ef.  . H/O exercise stress test 2008; October 2014    was normal in 2008; positive for ischemia with inferior and lateral ST depressions October 2014   Past Surgical History  Procedure Laterality Date  . Cardiac catheterization  04/2010    with no evidence of ischemia or significant coronary disease to speak of, normal LV function with relatively normal EDP.  Marland Kitchen Abdominal exploration surgery  1980  . Nasal sinus surgery    . Left heart catheterization with coronary angiogram N/A 03/18/2013    Procedure: LEFT HEART CATHETERIZATION WITH CORONARY ANGIOGRAM;  Surgeon: Kathleene Hazel, MD;  Location: [facility] CATH LAB;  Service: Cardiovascular;  Laterality: N/A;   Family History  Problem Relation Age of Onset  . Hypertension Mother   . Hypertension Father   . Stroke Maternal Grandmother   . Atrial fibrillation Brother   . Stroke Brother   . Diabetes Maternal Grandmother   . Heart attack Paternal Grandfather   . Cancer Maternal  Grandfather   . Cancer Paternal Grandfather   . Diabetes Paternal Grandmother   . Diabetes Paternal Grandfather   . Hypertension    . Hypertension Sister   . Hypertension Brother   . Stroke Father   . Asthma Mother   . Anemia Mother    Social History  Substance Use Topics  . Smoking status: Never Smoker   . Smokeless tobacco: Never Used  . Alcohol Use: No   OB History    No data available     Review of Systems  Constitutional: Negative for fever, chills, diaphoresis, appetite change and fatigue.  HENT: Negative for mouth sores, sore throat and trouble swallowing.   Eyes: Negative for visual disturbance.  Respiratory: Positive for cough. Negative for chest tightness, shortness of breath and wheezing.   Cardiovascular: Negative for chest pain.  Gastrointestinal: Positive for rectal pain. Negative for nausea, vomiting, abdominal pain, diarrhea and abdominal distention.  Endocrine: Negative for polydipsia, polyphagia and polyuria.  Genitourinary: Negative for dysuria, frequency and hematuria.  Musculoskeletal: Negative for gait problem.  Skin: Negative for color change, pallor and rash.  Neurological: Positive for dizziness and light-headedness. Negative for syncope and headaches.  Hematological: Does not bruise/bleed easily.  Psychiatric/Behavioral: Negative for behavioral problems and confusion.      Allergies  Codeine; Amoxicillin;  Augmentin; Bee venom; Milk-related compounds; Shellfish allergy; Sulfa antibiotics; and Sulfur  Home Medications   Prior to Admission medications   Medication Sig Start Date End Date Taking? Authorizing Provider  acetaminophen (TYLENOL) 325 MG tablet Take 500 mg by mouth every 6 (six) hours as needed for mild pain.    Yes Historical Provider, MD  amLODipine (NORVASC) 2.5 MG tablet Take 2.5 mg by mouth 2 (two) times daily.  07/04/15  Yes Historical Provider, MD  Chlorpheniramine-APAP (CORICIDIN) 2-325 MG TABS Take 1 tablet by mouth daily as  needed (chest congestion.).   Yes Historical Provider, MD  EPINEPHrine (EPIPEN 2-PAK) 0.3 mg/0.3 mL IJ SOAJ injection as directed. 11/29/13  Yes Historical Provider, MD  fexofenadine (ALLEGRA) 180 MG tablet Take 180 mg by mouth daily.   Yes Historical Provider, MD  FLUoxetine (PROZAC) 20 MG capsule TK ONE C PO QAM 05/28/15  Yes Historical Provider, MD  LORazepam (ATIVAN) 1 MG tablet Take 0.5 mg by mouth daily as needed. Take 1/2 to 1 maximum twice daily for anxiety only as needed 12/24/11  Yes Peyton Najjaravid H Hopper, MD  Multiple Vitamin (MULTIVITAMIN) capsule Take by mouth.   Yes Historical Provider, MD  ranitidine (ZANTAC) 75 MG tablet Take 150 mg by mouth daily.    Yes Historical Provider, MD   BP 140/82 mmHg  Pulse 55  Temp(Src) 98.1 F (36.7 C) (Oral)  Resp 16  Ht 5\' 1"  (1.549 m)  Wt 161 lb (73.029 kg)  BMI 30.44 kg/m2  SpO2 99%  LMP 11/16/2009 Physical Exam  Constitutional: She is oriented to person, place, and time. She appears well-developed and well-nourished. No distress.  HENT:  Head: Normocephalic.  Eyes: Conjunctivae are normal. Pupils are equal, round, and reactive to light. No scleral icterus.  Neck: Normal range of motion. Neck supple. No thyromegaly present.  Cardiovascular: Normal rate and regular rhythm.  Exam reveals no gallop and no friction rub.   No murmur heard. Pulmonary/Chest: Effort normal and breath sounds normal. No respiratory distress. She has no wheezes. She has no rales.  Abdominal: Soft. Bowel sounds are normal. She exhibits no distension. There is no tenderness. There is no rebound.  Genitourinary:     Musculoskeletal: Normal range of motion.  Neurological: She is alert and oriented to person, place, and time.  Skin: Skin is warm and dry. No rash noted.  Psychiatric: She has a normal mood and affect. Her behavior is normal.    ED Course  Procedures (including critical care time) Labs Review Labs Reviewed  BASIC METABOLIC PANEL - Abnormal; Notable for  the following:    Glucose, Bld 121 (*)    All other components within normal limits  CBC - Abnormal; Notable for the following:    MCHC 36.1 (*)    All other components within normal limits  URINALYSIS, ROUTINE W REFLEX MICROSCOPIC (NOT AT [facility]) - Abnormal; Notable for the following:    Hgb urine dipstick TRACE (*)    All other components within normal limits  URINE MICROSCOPIC-ADD ON - Abnormal; Notable for the following:    Squamous Epithelial / LPF 0-5 (*)    Bacteria, UA RARE (*)    All other components within normal limits  CBG MONITORING, ED - Abnormal; Notable for the following:    Glucose-Capillary 121 (*)    All other components within normal limits  I-STAT TROPOININ, ED    Imaging Review Dg Chest 2 View  10/15/2015  CLINICAL DATA:  Dizziness, mid chest pain, elevated blood pressure,  hypertension, rectal prolapse diagnosed yesterday, asthma EXAM: CHEST  2 VIEW COMPARISON:  08/17/2015 FINDINGS: Enlargement of cardiac silhouette. Mediastinal contours and pulmonary vascularity normal. Lungs clear. No pleural effusion or pneumothorax. Bones unremarkable. IMPRESSION: Minimal enlargement of cardiac silhouette without acute infiltrate. Electronically Signed   By: Ulyses SouthwardMark  Boles M.D.   On: 10/15/2015 16:41   Ct Abdomen Pelvis W Contrast  10/15/2015  CLINICAL DATA:  Bilateral lower abdominal pain. EXAM: CT ABDOMEN AND PELVIS WITH CONTRAST TECHNIQUE: Multidetector CT imaging of the abdomen and pelvis was performed using the standard protocol following bolus administration of intravenous contrast. CONTRAST:  100mL ISOVUE-300 IOPAMIDOL (ISOVUE-300) INJECTION 61% COMPARISON:  None. FINDINGS: Lower chest: No acute findings. Distal esophageal wall thickening as can be seen with esophagitis. Hepatobiliary: No masses or other significant abnormality. Pancreas: No mass, inflammatory changes, or other significant abnormality. Spleen: Within normal limits in size and appearance. Adrenals/Urinary Tract: No  masses identified. No evidence of hydronephrosis. Stomach/Bowel: No evidence of obstruction, inflammatory process, or abnormal fluid collections. Normal appendix. Vascular/Lymphatic: No pathologically enlarged lymph nodes. No evidence of abdominal aortic aneurysm. Reproductive: No mass or other significant abnormality. Other: None. Musculoskeletal: No suspicious bone lesions identified. Bilateral facet arthropathy at L3-4, L4-5 and L5-S1. IMPRESSION: 1. No acute abdominal or pelvic pathology. 2. Distal esophageal wall thickening as can be seen with esophagitis. Electronically Signed   By: Elige KoHetal  Patel   On: 10/15/2015 19:03   I have personally reviewed and evaluated these images and lab results as part of my medical decision-making.   EKG Interpretation   Date/Time:  Thursday October 15 2015 14:17:06 EDT Ventricular Rate:  64 PR Interval:    QRS Duration: 86 QT Interval:  431 QTC Calculation: 445 R Axis:   76 Text Interpretation:  Sinus rhythm Confirmed by Lincoln Brighamees, Liz [phone]([id number]) on  10/15/2015 2:19:40 PM Also confirmed by Lincoln Brighamees, Liz [phone]([id number]), editor [place] CT,  Jola BabinskiMarilyn [phone])  on 10/15/2015 2:28:45 PM      MDM   Final diagnoses:  Dizzy    CT scan obtained because her lower abdominal tenderness. This could indeed be musculoskeletal from her recent new workout including leg raises and squats as well as abdominal crunches. However, she seems to have some abdominal tenderness even when her abdominal wall is relaxed. Fluids, serial enzymes EKG chest x-ray reevaluation.  Subjectively she feels better. Taking by mouth. Endotracheal from the bathroom. Reassuring studies a normal CT scan appropriate for discharge home.    Rolland PorterMark Rithvik Orcutt, MD 10/15/15 2031

## 2015-10-15 NOTE — ED Notes (Addendum)
Pt presents via EMS with c/o dizziness. Pt was diagnosed yesterday with a prolapsed rectum. Pt took an anti-inflammatory suppository this morning and has had the dizziness ever since then along with some "fullness" in her chest. Pt is scheduled for surgery in August for her prolapsed rectum. Pt reports she does not feel dizzy when she is laying down but reports the dizziness when she stands up.

## 2015-10-15 NOTE — ED Notes (Signed)
Pt ambulated to the restroom with a steady gait and only standby assist.

## 2015-10-15 NOTE — Discharge Instructions (Signed)
Rest. Push fluids, stay hydrated.  Follow up with your primary care physician.

## 2015-12-23 ENCOUNTER — Other Ambulatory Visit: Payer: Self-pay

## 2015-12-23 DIAGNOSIS — Z1231 Encounter for screening mammogram for malignant neoplasm of breast: Secondary | ICD-10-CM

## 2016-01-20 ENCOUNTER — Ambulatory Visit (INDEPENDENT_AMBULATORY_CARE_PROVIDER_SITE_OTHER): Payer: BC Managed Care – PPO | Admitting: Physician Assistant

## 2016-01-20 ENCOUNTER — Encounter: Payer: Self-pay | Admitting: Physician Assistant

## 2016-01-20 ENCOUNTER — Encounter (INDEPENDENT_AMBULATORY_CARE_PROVIDER_SITE_OTHER): Payer: Self-pay

## 2016-01-20 VITALS — BP 122/70 | HR 60 | Ht 61.5 in | Wt 162.1 lb

## 2016-01-20 DIAGNOSIS — I201 Angina pectoris with documented spasm: Secondary | ICD-10-CM | POA: Diagnosis not present

## 2016-01-20 DIAGNOSIS — I10 Essential (primary) hypertension: Secondary | ICD-10-CM

## 2016-01-20 MED ORDER — AMLODIPINE BESYLATE 2.5 MG PO TABS: 5.0000 mg | ORAL_TABLET | Freq: Two times a day (BID) | ORAL | 11 refills | Status: DC

## 2016-01-20 NOTE — Patient Instructions (Addendum)
Medication Instructions:  1. NEW RX FOR AMLODIPINE 2.5 MG TABLET WITH THE DIRECTIONS TO TAKE 2 TABLETS TWICE DAILY 2. HOWEVER PER SCOTT WEAVER, PAC CONTINUE ON YOUR CURRENT DOSE FOR 1 WEEK. AFTER 1 WEEK PLEASE CALL WITH READINGS TO SCOTT WEAVER, PAC [phone] TO SEE IF WE DO NEED TO START THE INCREASED DOSE OF AMLODIPINE    Labwork: NONOE  Testing/Procedures: NONE  Follow-Up: Your physician wants you to follow-up in: 6 MONTHS WITH DR. Clifton JamesMCALHANY You will receive a reminder letter in the mail two months in advance. If you don't receive a letter, please call our office to schedule the follow-up appointment.  Any Other Special Instructions Will Be Listed Below (If Applicable).  If you need a refill on your cardiac medications before your next appointment, please call your pharmacy.

## 2016-01-20 NOTE — Progress Notes (Signed)
Cardiology Office Note:    Date:  01/20/2016   ID:  Bianca [name], DOB 1955/06/04, MRN [id number]  PCP:  Thayer Headings, MD  Cardiologist:  Dr. Verne Carrow   Electrophysiologist:  n/a  Referring MD: Thayer Headings, MD   Chief Complaint  Patient presents with  . Follow-up    HTN    History of Present Illness:    Bianca [name] is a 60 y.o. female with a hx of chest pain, HTN, asthma, anxiety.  LHC in 2014 demonstrated no CAD.  She had previous ECG changes on ETT and was treated for possible vasospasm.  Last seen by Dr. Verne Carrow in 11/16.    She is here for FU on her blood pressure.  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  She followed her blood pressure and it was running in the 140s.  Her HR was in the 80s. She had been sick around that time with sinusitis and took antibiotics as well as prednisone and a decongestant.  She had 1 episode of R neck pain but has not had a recurrence.  She has chronic, stable chest pain related to vasospasm that is overall controlled.  She notes dyspnea on exertion related to her asthma/allergies.  She denies orthopnea, paroxysmal nocturnal dyspnea, edema.  She denies syncope.  She denies spinning sensation.    Prior CV studies that were reviewed today include:    LHC (03/2013):   No CAD, EF 65%  Echo (04/2010):   EF 55%  Carotid US (4/14):   Normal   Past Medical History:  Diagnosis Date  . Allergy   . Anemia   . Anxiety   . Asthma   . H/O exercise stress test 2008; October 2014   was normal in 2008; positive for ischemia with inferior and lateral ST depressions October 2014  . Hx of echocardiogram 091/2012   relatively normal as well. No significant valvar lesions. Normal Ef.  . Hypertension   . Migraines   . Palpitations     Past Surgical History:  Procedure Laterality Date  . ABDOMINAL EXPLORATION SURGERY  1980  . CARDIAC CATHETERIZATION  04/2010    with no evidence of ischemia or significant coronary disease to speak of, normal LV function with relatively normal EDP.  Marland Kitchen LEFT HEART CATHETERIZATION WITH CORONARY ANGIOGRAM N/A 03/18/2013   Procedure: LEFT HEART CATHETERIZATION WITH CORONARY ANGIOGRAM;  Surgeon: Kathleene Hazel, MD;  Location: [facility] CATH LAB;  Service: Cardiovascular;  Laterality: N/A;  . NASAL SINUS SURGERY      Current Medications: Current Meds  Medication Sig  . acetaminophen (TYLENOL) 325 MG tablet Take 500 mg by mouth every 6 (six) hours as needed for mild pain.   Marland Kitchen amLODipine (NORVASC) 2.5 MG tablet Take 2 tablets (5 mg total) by mouth 2 (two) times daily.  . Chlorpheniramine-APAP (CORICIDIN) 2-325 MG TABS Take 1 tablet by mouth daily as needed (chest congestion.).  Marland Kitchen EPINEPHrine (EPIPEN 2-PAK) 0.3 mg/0.3 mL IJ SOAJ injection as directed.  . fexofenadine (ALLEGRA) 180 MG tablet Take 180 mg by mouth daily.  Marland Kitchen FLUoxetine (PROZAC) 20 MG tablet Take 20 mg by mouth daily.  Marland Kitchen LORazepam (ATIVAN) 1 MG tablet Take 0.5 mg by mouth daily as needed. Take 1/2 to 1 maximum twice daily for anxiety only as needed  . Multiple Vitamin (MULTIVITAMIN) capsule Take 1 capsule by mouth daily.   Marland Kitchen PROAIR HFA 108 (90 Base) MCG/ACT inhaler Inhale 2 puffs into the  lungs 3 times/day as needed-between meals & bedtime for wheezing.  Marland Kitchen PROCTOFOAM HC rectal foam Place 1 application rectally 3 times/day as needed-between meals & bedtime for hemorrhoids.  . ranitidine (ZANTAC) 75 MG tablet Take 150 mg by mouth daily.   . [DISCONTINUED] amLODipine (NORVASC) 2.5 MG tablet Take 2.5 mg by mouth 3 (three) times daily.      Allergies:   Codeine; Amoxicillin; Augmentin [amoxicillin-pot clavulanate]; Bee venom; Latex; Milk-related compounds; Shellfish allergy; Sulfa antibiotics; and Sulfur   Social History   Social History  . Marital status: Single    Spouse name: N/A  . Number of children: 0  . Years of education: N/A   Occupational History    . Retired Chartered loss adjuster    Social History Main Topics  . Smoking status: Never Smoker  . Smokeless tobacco: Never Used  . Alcohol use No  . Drug use: No  . Sexual activity: Not Asked   Other Topics Concern  . None   Social History Narrative   Single woman, who lives alone. 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   She does not smoke or drink alcohol.   She occasionally exercises walking on a treadmill.     Family History:  The patient's family history includes Anemia in her mother; Asthma in her mother; Atrial fibrillation in her brother; Cancer in her maternal grandfather and paternal grandfather; Diabetes in her maternal grandmother, paternal grandfather, and paternal grandmother; Heart attack in her paternal grandfather; Hypertension in her brother, father, mother, and sister; Stroke in her brother, father, and maternal grandmother.   ROS:   Please see the history of present illness.    Review of Systems  Constitution: Positive for malaise/fatigue.  HENT: Positive for headaches.   Cardiovascular: Positive for chest pain and dyspnea on exertion.  Respiratory: Positive for cough, shortness of breath and wheezing.   Hematologic/Lymphatic: Bruises/bleeds easily.  Musculoskeletal: Positive for back pain, joint swelling and myalgias.  Neurological: Positive for dizziness and loss of balance.   All other systems reviewed and are negative.   EKGs/Labs/Other Test Reviewed:    EKG:  EKG is  ordered today.  The ekg ordered today demonstrates NSR, HR 60, normal axis, NSSTTW changes, QTc 424 ms, no change since 11/16 tracing  Recent Labs: 10/15/2015: BUN 9; Creatinine, Ser 0.72; Hemoglobin 13.6; Platelets 312; Potassium 3.5; Sodium 140   Recent Lipid Panel No results found for: CHOL, TRIG, HDL, CHOLHDL, VLDL, LDLCALC, LDLDIRECT   Physical Exam:    VS:  BP 122/70   Pulse 60    Ht 5' 1.5" (1.562 m)   Wt 162 lb 1.9 oz (73.5 kg)   LMP 11/16/2009   BMI 30.14 kg/m     Wt Readings from Last 3 Encounters:  01/20/16 162 lb 1.9 oz (73.5 kg)  10/15/15 161 lb (73 kg)  07/11/15 164 lb (74.4 kg)     Physical Exam  Constitutional: She is oriented to person, place, and time. She appears well-developed and well-nourished. No distress.  HENT:  Head: Normocephalic and atraumatic.  Eyes: No scleral icterus.  Neck: Normal range of motion. No JVD present. Carotid bruit is not present.  Cardiovascular: Normal rate, regular rhythm, S1 normal, S2 normal and normal heart sounds.   No murmur heard. Pulmonary/Chest: Breath sounds normal. She has no wheezes. She has no rhonchi. She has no rales.  Abdominal: Soft. There is  no tenderness.  Musculoskeletal: She exhibits no edema.  Neurological: She is alert and oriented to person, place, and time.  Skin: Skin is warm and dry.  Psychiatric: She has a normal mood and affect.    ASSESSMENT:    1. Essential hypertension   2. Coronary vasospasm (HCC)    PLAN:    In order of problems listed above:  1. HTN - Her BP has run somewhat higher and she is symptomatic when her blood pressure is up.  It looks better now on Amlodipine 5 in A and 2.5 in P.  She did have 1 reading today that was higher.  Continue current Rx. If blood pressure runs higher over the next week, she can increase Amlodipine to 5 mg bid.  She can send some blood pressure readings in ~ 1 week for me to review.   2. Coronary Vasospasm - Her chest pain is overall stable on calcium channel blocker. Continue current Rx.    Medication Adjustments/Labs and Tests Ordered: Current medicines are reviewed at length with the patient today.  Concerns regarding medicines are outlined above.  Medication changes, Labs and Tests ordered today are outlined in the Patient Instructions noted below. Patient Instructions  Medication Instructions:  1. NEW RX FOR AMLODIPINE 2.5 MG  TABLET WITH THE DIRECTIONS TO TAKE 2 TABLETS TWICE DAILY 2. HOWEVER PER Dedee Liss, PAC CONTINUE ON YOUR CURRENT DOSE FOR 1 WEEK. AFTER 1 WEEK PLEASE CALL WITH READINGS TO Nhyla Nappi, PAC [phone] TO SEE IF WE DO NEED TO START THE INCREASED DOSE OF AMLODIPINE    Labwork: NONOE  Testing/Procedures: NONE  Follow-Up: Your physician wants you to follow-up in: 6 MONTHS WITH DR. Clifton JamesMCALHANY You will receive a reminder letter in the mail two months in advance. If you don't receive a letter, please call our office to schedule the follow-up appointment.  Any Other Special Instructions Will Be Listed Below (If Applicable).  If you need a refill on your cardiac medications before your next appointment, please call your pharmacy.  Signed, Tereso NewcomerScott Quenisha Lovins, PA-C  01/20/2016 10:09 AM    [facility] [address], [place], [place]  [address] Phone: [phone]; Fax: [phone]

## 2016-06-24 ENCOUNTER — Encounter: Payer: Self-pay | Admitting: Cardiovascular Disease

## 2016-06-26 NOTE — Progress Notes (Signed)
r   Chief Complaint  Patient presents with  . Chest Pain    History of Present Illness: 61 yo female with history of chest pain, HTN, asthma, anxiety, suspicion for coronary vasospasm who is here today for cardiac follow up. I saw her as a new patient 03/13/13 for a second cardiology opinion. She had been followed by Dr. Bryan Lemmaavid Harding in our practice. She has had chronic chest pain with normal cardiac cath in 2012 and again in 2014. Echo January 2012 with normal LV function, no valve issues. She was seen in our office July 2015 by Tereso NewcomerScott Weaver, PA-C for dizziness. Norvasc was reduced. She was seen again October 2017 for hypertension and Norvasc was increased to 5 mg daily.   She is here today for follow up. She is feeling well. No chest pain or SOB.   Primary Care Physician: Thayer HeadingsMACKENZIE,BRIAN, MD  Past Medical History:  Diagnosis Date  . Allergy   . Anemia   . Anxiety   . Asthma   . H/O exercise stress test 2008; October 2014   was normal in 2008; positive for ischemia with inferior and lateral ST depressions October 2014  . Hx of echocardiogram 091/2012   relatively normal as well. No significant valvar lesions. Normal Ef.  . Hypertension   . Migraines   . Palpitations     Past Surgical History:  Procedure Laterality Date  . ABDOMINAL EXPLORATION SURGERY  1980  . CARDIAC CATHETERIZATION  04/2010   with no evidence of ischemia or significant coronary disease to speak of, normal LV function with relatively normal EDP.  Marland Kitchen. LEFT HEART CATHETERIZATION WITH CORONARY ANGIOGRAM N/A 03/18/2013   Procedure: LEFT HEART CATHETERIZATION WITH CORONARY ANGIOGRAM;  Surgeon: Kathleene Hazelhristopher D Embree Brawley, MD;  [facility];  Service: Cardiovascular;  Laterality: N/A;  . NASAL SINUS SURGERY      Current Outpatient Prescriptions  Medication Sig Dispense Refill  . acetaminophen (TYLENOL) 325 MG tablet Take 500 mg by mouth every 6 (six) hours as needed for mild pain.     Marland Kitchen. amLODipine (NORVASC) 2.5  MG tablet Take 2 tablets (5 mg total) by mouth 2 (two) times daily. 120 tablet 11  . Chlorpheniramine-APAP (CORICIDIN) 2-325 MG TABS Take 1 tablet by mouth daily as needed (chest congestion.).    Marland Kitchen. EPINEPHrine (EPIPEN 2-PAK) 0.3 mg/0.3 mL IJ SOAJ injection as directed.    . fexofenadine (ALLEGRA) 180 MG tablet Take 180 mg by mouth daily.    Marland Kitchen. FLUoxetine (PROZAC) 20 MG tablet Take 20 mg by mouth daily.    Marland Kitchen. LORazepam (ATIVAN) 1 MG tablet Take 0.5 mg by mouth daily as needed. Take 1/2 to 1 maximum twice daily for anxiety only as needed    . Multiple Vitamin (MULTIVITAMIN) capsule Take 1 capsule by mouth daily.     Marland Kitchen. PROAIR HFA 108 (90 Base) MCG/ACT inhaler Inhale 2 puffs into the lungs 3 times/day as needed-between meals & bedtime for wheezing.  1  . PROCTOFOAM HC rectal foam Place 1 application rectally 3 times/day as needed-between meals & bedtime for hemorrhoids.  1  . ranitidine (ZANTAC) 75 MG tablet Take 150 mg by mouth daily.      No current facility-administered medications for this visit.     Allergies  Allergen Reactions  . Codeine Other (See Comments)    Other reaction(s): Other (See Comments) Pounding in head  Pounding in head   . Amoxicillin     Diarrhea and vomiting.  Has patient had a  PCN reaction causing immediate rash, facial/tongue/throat swelling, SOB or lightheadedness with hypotension: No Has patient had a PCN reaction causing severe rash involving mucus membranes or skin necrosis: No Has patient had a PCN reaction that required hospitalization No Has patient had a PCN reaction occurring within the last 10 years: No If all of the above answers are "NO", then may proceed with Cephalosporin use.   . Augmentin [Amoxicillin-Pot Clavulanate]     Diarrhea and vomiting.   . Bee Venom     Swelling  . Latex Hives  . Milk-Related Compounds     Diarrhea & gas  . Shellfish Allergy     HIVES, SWELLING, N/V & DIARREHA  . Sulfa Antibiotics   . Sulfur     Social History    Social History  . Marital status: Single    Spouse name: N/A  . Number of children: 0  . Years of education: N/A   Occupational History  . Retired Chartered loss adjuster    Social History Main Topics  . Smoking status: Never Smoker  . Smokeless tobacco: Never Used  . Alcohol use No  . Drug use: No  . Sexual activity: Not on file   Other Topics Concern  . Not on file   Social History Narrative   Single woman, who lives alone. 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   She does not smoke or drink alcohol.   She occasionally exercises walking on a treadmill.    Family History  Problem Relation Age of Onset  . Hypertension Mother   . Asthma Mother   . Anemia Mother   . Hypertension Father   . Stroke Father   . Stroke Maternal Grandmother   . Diabetes Maternal Grandmother   . Cancer Maternal Grandfather   . Diabetes Paternal Grandmother   . Heart attack Paternal Grandfather   . Cancer Paternal Grandfather   . Diabetes Paternal Grandfather   . Atrial fibrillation Brother   . Stroke Brother   . Hypertension    . Hypertension Sister   . Hypertension Brother     Review of Systems:  As stated in the HPI and otherwise negative.   BP 126/66   Pulse 67   Ht 5' 1.5" (1.562 m)   Wt 165 lb (74.8 kg)   LMP 11/16/2009   SpO2 98%   BMI 30.67 kg/m   Physical Examination: General: Well developed, well nourished, NAD  HEENT: OP clear, mucus membranes moist  SKIN: warm, dry. No rashes. Neuro: No focal deficits  Musculoskeletal: Muscle strength 5/5 all ext  Psychiatric: Mood and affect normal  Neck: No JVD, no carotid bruits, no thyromegaly, no lymphadenopathy.  Lungs:Clear bilaterally, no wheezes, rhonci, crackles Cardiovascular: Regular rate and rhythm. No murmurs, gallops or rubs. Abdomen:Soft. Bowel sounds present. Non-tender.  Extremities: No lower extremity edema.  Pulses are 2 + in the bilateral DP/PT. Right arm without bruising. Ulnar and radial pulses are 2+.   Cardiac cath 03/18/13: Left main: No obstructive disease.  Left Anterior Descending Artery: Large caliber vessel that courses to the apex. There are several small caliber diagonal branches. No obstructive disease.  Circumflex Artery: Large caliber vessel with large obtuse marginal branch. No obstructive disease.  Right Coronary Artery: Large dominant vessel with no obstructive disease.  Left Ventricular Angiogram: LVEF=65%.  Impression:  1. No angiographic evidence of CAD  2. Normal LV systolic function  3. Chest pain, cannot fully exclude coronary vasospasm with ST depression on treadmill exercise test.   EKG:  EKG is not ordered today. The ekg ordered today demonstrates   Recent Labs: 10/15/2015: BUN 9; Creatinine, Ser 0.72; Hemoglobin 13.6; Platelets 312; Potassium 3.5; Sodium 140   Lipid Panel No results found for: CHOL, TRIG, HDL, CHOLHDL, VLDL, LDLCALC, LDLDIRECT   Wt Readings from Last 3 Encounters:  06/27/16 165 lb (74.8 kg)  01/20/16 162 lb 1.9 oz (73.5 kg)  10/15/15 161 lb (73 kg)     Other studies Reviewed: Additional studies/ records that were reviewed today include: . 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 Her chest pain could be due to coronary vasospasm. Will continue Norvasc.   2. HTN: BP is controlled today. Continue Norvasc.   Current medicines are reviewed at length with the patient today.  The patient does not have concerns regarding medicines.  The following changes have been made:  no change  Labs/ tests ordered today include:   No orders of the defined types were placed in this encounter.   Disposition:   FU with me in 12  months  Signed, Verne Carrow, MD 06/27/2016 10:05 AM    [facility] [address] [place],  [place], [place]  [address] Phone: [phone]; Fax: [phone]

## 2016-06-27 ENCOUNTER — Ambulatory Visit (INDEPENDENT_AMBULATORY_CARE_PROVIDER_SITE_OTHER): Payer: BC Managed Care – PPO | Admitting: Cardiovascular Disease

## 2016-06-27 ENCOUNTER — Encounter (INDEPENDENT_AMBULATORY_CARE_PROVIDER_SITE_OTHER): Payer: Self-pay

## 2016-06-27 ENCOUNTER — Encounter: Payer: Self-pay | Admitting: Cardiovascular Disease

## 2016-06-27 VITALS — BP 126/66 | HR 67 | Ht 61.5 in | Wt 165.0 lb

## 2016-06-27 DIAGNOSIS — I201 Angina pectoris with documented spasm: Secondary | ICD-10-CM

## 2016-06-27 DIAGNOSIS — I10 Essential (primary) hypertension: Secondary | ICD-10-CM

## 2016-06-27 NOTE — Patient Instructions (Signed)

## 2016-07-27 ENCOUNTER — Ambulatory Visit: Payer: BC Managed Care – PPO | Admitting: Cardiovascular Disease

## 2016-10-21 ENCOUNTER — Telehealth: Payer: Self-pay | Admitting: Physician Assistant

## 2016-10-21 NOTE — Telephone Encounter (Signed)
I called the pt back with the recommendations per Tereso NewcomerScott Weaver, PA: I do not find any indication in her chart that I have filled this for her before.   Regardless, this type of drug should be managed by primary care or psychiatry. I would like for her to get refills from her PCP.   Tereso NewcomerScott Weaver, PA-C    10/21/2016 1:13 PM    Pt kept cutting me off while I was trying to go over recommendations with her . She states PCP decided to fill Alprazolam for her. Pt does state she has a bottle of Alprazolam with Tereso NewcomerScott Weaver, PA name on it . I advised Tereso NewcomerScott Weaver, PA of this and he does not know where it came from. Pt abruptly hung on me. See previous phone note from this morning.

## 2016-10-21 NOTE — Telephone Encounter (Signed)
Bianca [name] called our office today asking for an Rx for Alprazolam. Bianca [name] states Bianca NewcomerScott Weaver, [name] filled for her once before. I advised Bianca [name] that generally cardiology does not fill this medication, though the provider may give you an Rx one time until you can f/u with PCP. I advised Bianca [name] she should contact PCP for this Rx. Bianca [name] states PCP says since someone in cardiology filled before to call our office. I advised Bianca [name] I will d/w [name] for further recommendations and call her back later today.  While I was reviewing chart I did not see where Bianca [name] had ordered Alprazolam for the Bianca [name] before. I will d/w [name] further. I do see Ativan in Bianca [name]'s chart which seemed to be ordered by Bianca [name] 2013, not sure who has been filling her Xanax for her since then.

## 2016-10-21 NOTE — Telephone Encounter (Signed)
New message       *STAT* If patient is at the pharmacy, call can be transferred to refill team.   1. Which medications need to be refilled? (please list name of each medication and dose if known) alprazolan 5mg  2. Which pharmacy/location (including street and city if local pharmacy) is medication to be sent to? [facility] [address]  3. Do they need a 30 day or 90 day supply? Pt is flying 13 hrs each way----first time flyer---she is claustrophobic.  Pt states that the PA gave her this presc once before.

## 2016-10-21 NOTE — Telephone Encounter (Signed)
I do not find any indication in her chart that I have filled this for her before.   Regardless, this type of drug should be managed by primary care or psychiatry. I would like for her to get refills from her PCP.   Tereso NewcomerScott Weaver, PA-C    10/21/2016 1:13 PM

## 2017-02-22 ENCOUNTER — Other Ambulatory Visit: Payer: Self-pay | Admitting: Physician Assistant

## 2017-05-10 ENCOUNTER — Telehealth: Payer: Self-pay | Admitting: Cardiovascular Disease

## 2017-05-10 NOTE — Telephone Encounter (Signed)
I spoke with pt who reports she is trying to wean off Prozac. This is being followed by PA at primary care.  Dose has been decreased from 20 mg daily to 10 mg daily.  Pt reports BP today was 130 something /81 and pulse was 106.  No other readings available. This is higher than normal for her. Pt is asking if weaning Prozac could cause BP/pulse to increase.

## 2017-05-10 NOTE — Telephone Encounter (Signed)
New Message    Pt c/o medication issue:  1. Name of Medication: FLUoxetine (PROZAC)  2. How are you currently taking this medication (dosage and times per day)? 10mg  once a day   3. Are you having a reaction (difficulty breathing--STAT)? No   4. What is your medication issue? Paitent states that she has notice that her pulse rate as well as her bp has increased. She states this occurred when she went from taking 20mg  of fluoxetine to 10mg . Please call.

## 2017-05-10 NOTE — Telephone Encounter (Signed)
I reviewed with Bianca [name], PharmD who recommends pt contact primary care as they are helping pt wean off Prozac.  I spoke with pt and gave her this information.

## 2017-06-15 IMAGING — CR DG CHEST 2V
2 series · 2 of 2 positions shown · non-contrast
Comparison: 08/17/2015

CLINICAL DATA: Dizziness, mid chest pain, elevated blood pressure,
hypertension, rectal prolapse diagnosed yesterday, asthma

EXAM:
CHEST  2 VIEW

[w chest pa]
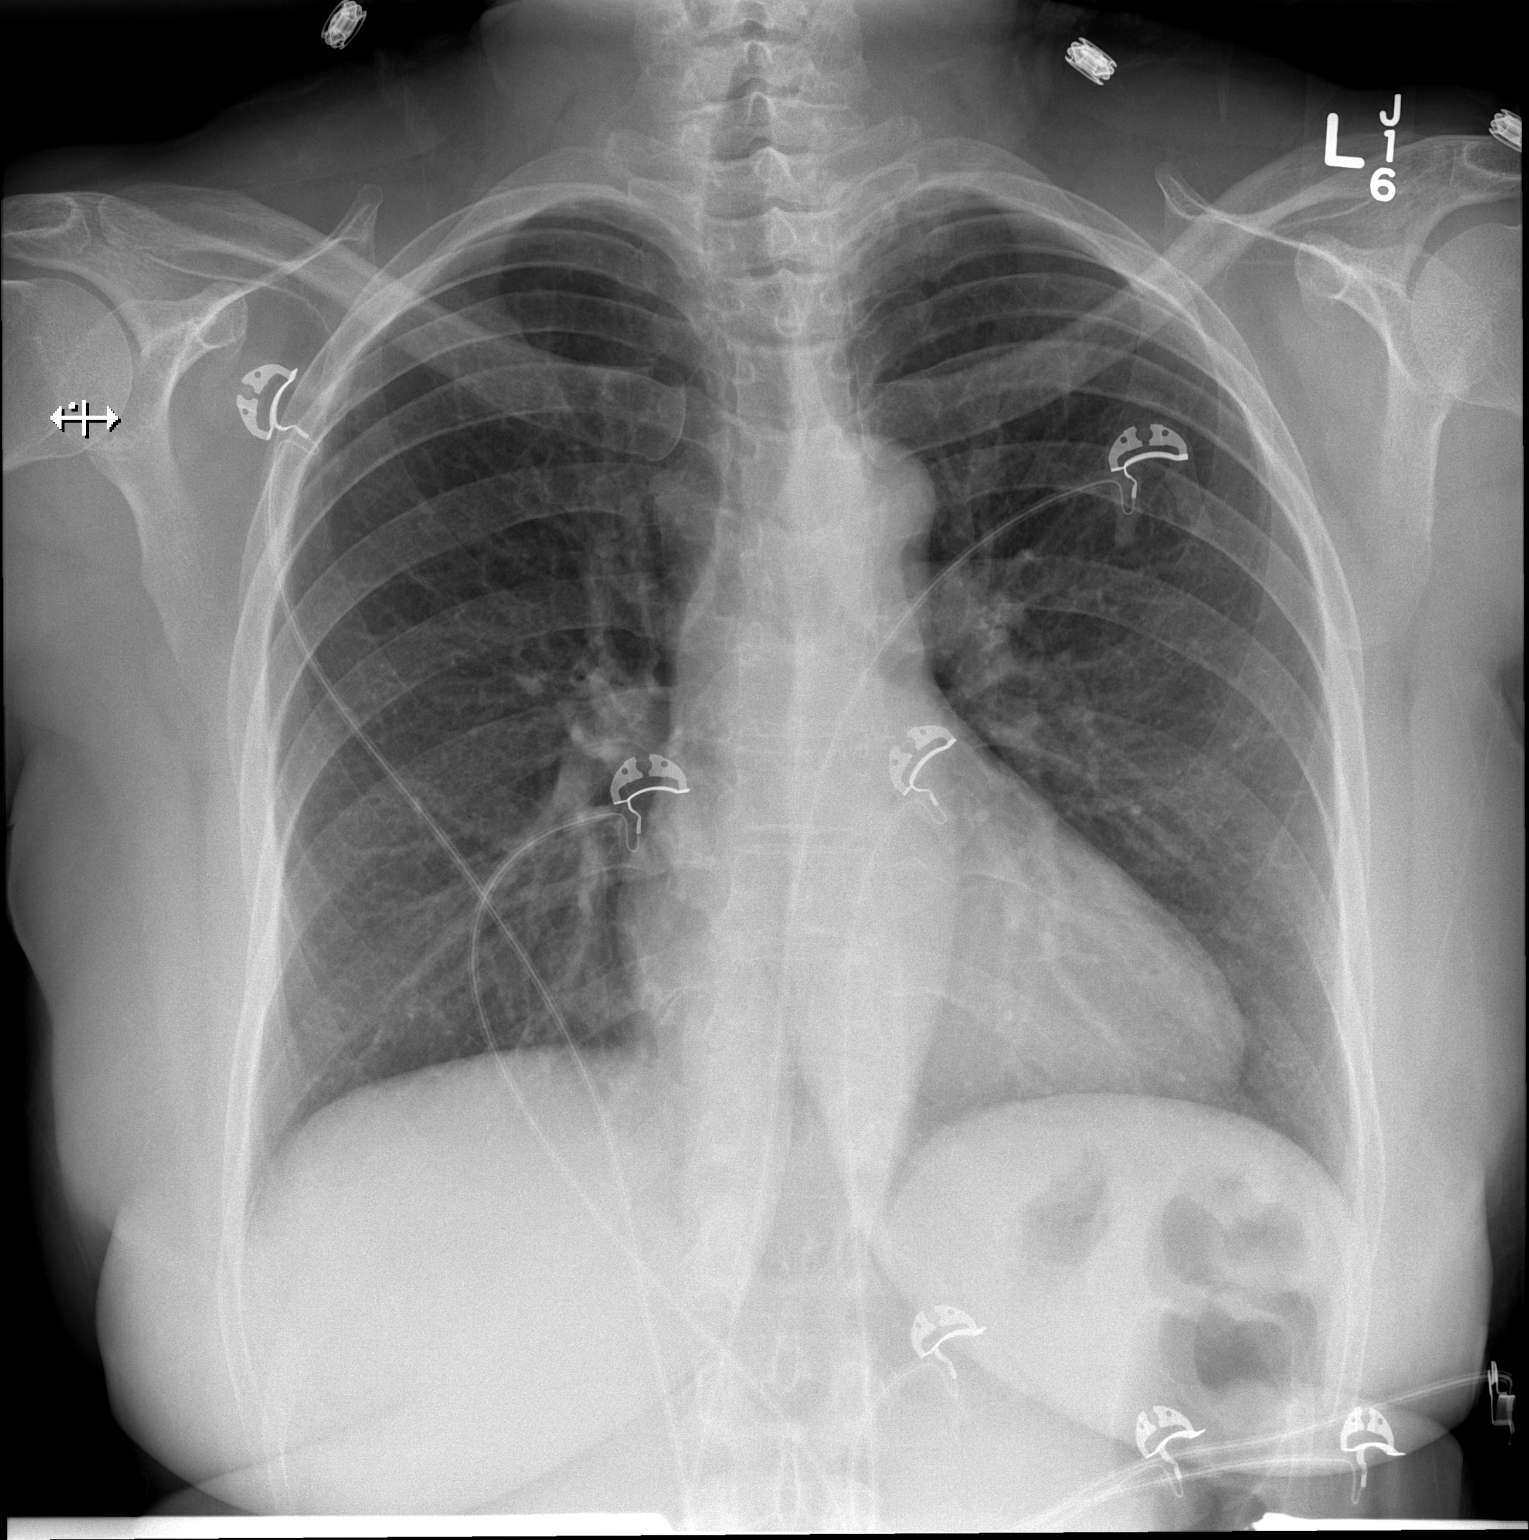

[w chest lat]
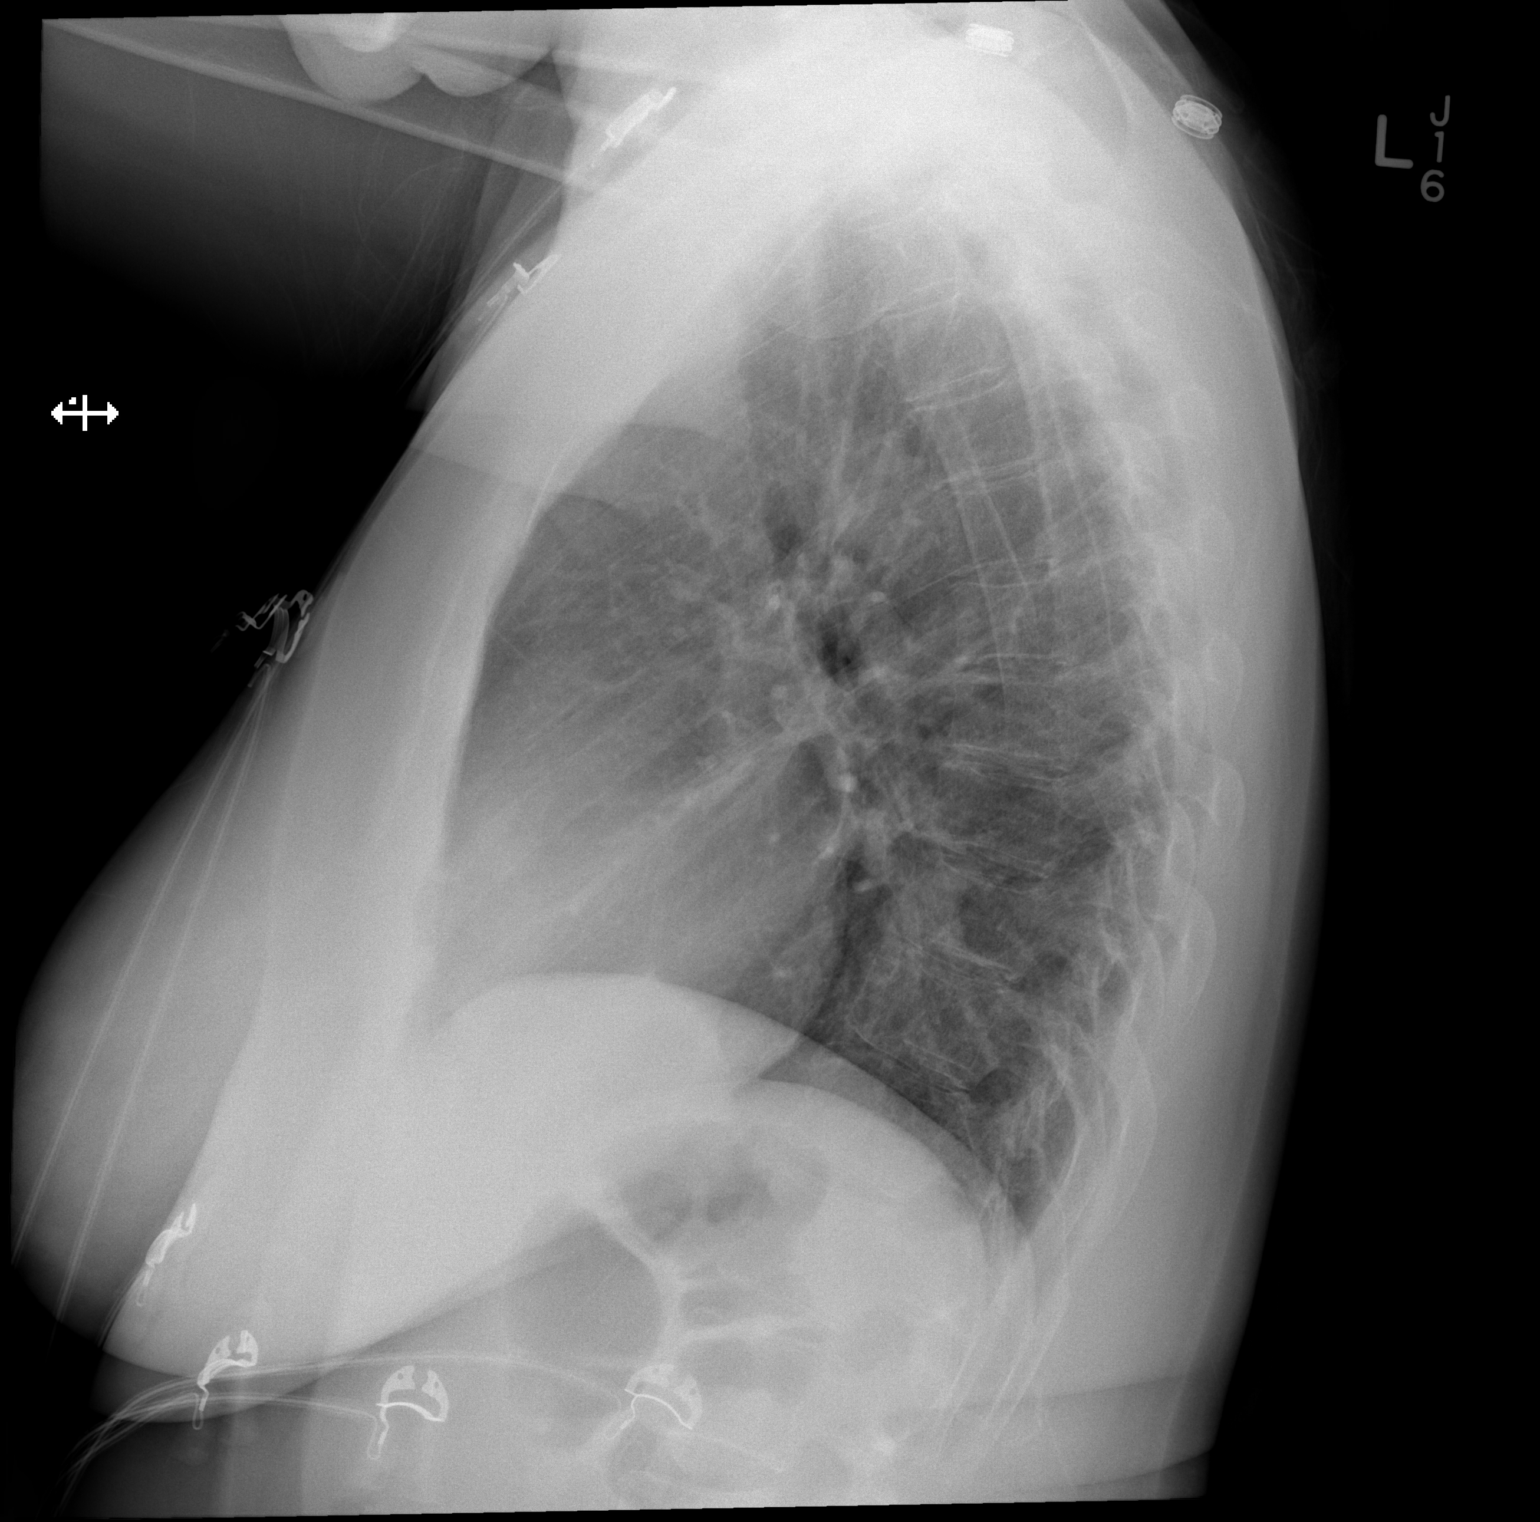

[2 of 2 positions shown; findings below may reference images not displayed]

FINDINGS: Enlargement of cardiac silhouette.

Mediastinal contours and pulmonary vascularity normal.

Lungs clear.

No pleural effusion or pneumothorax.

Bones unremarkable.
IMPRESSION: Minimal enlargement of cardiac silhouette without acute infiltrate.

## 2017-06-29 ENCOUNTER — Ambulatory Visit: Payer: BC Managed Care – PPO | Admitting: Cardiovascular Disease

## 2017-06-29 ENCOUNTER — Encounter: Payer: Self-pay | Admitting: Cardiovascular Disease

## 2017-06-29 ENCOUNTER — Encounter (INDEPENDENT_AMBULATORY_CARE_PROVIDER_SITE_OTHER): Payer: Self-pay

## 2017-06-29 VITALS — BP 104/70 | HR 64 | Ht 61.5 in | Wt 167.4 lb

## 2017-06-29 DIAGNOSIS — I201 Angina pectoris with documented spasm: Secondary | ICD-10-CM | POA: Diagnosis not present

## 2017-06-29 DIAGNOSIS — I10 Essential (primary) hypertension: Secondary | ICD-10-CM | POA: Diagnosis not present

## 2017-06-29 DIAGNOSIS — R600 Localized edema: Secondary | ICD-10-CM | POA: Diagnosis not present

## 2017-06-29 MED ORDER — FUROSEMIDE 20 MG PO TABS: ORAL_TABLET | ORAL | 10 refills | Status: DC

## 2017-06-29 NOTE — Progress Notes (Signed)
r   Chief Complaint  Patient presents with  . Follow-up    HTN    History of Present Illness: 62 yo female with history of chest pain, HTN, asthma, anxiety, suspicion for coronary vasospasm who is here today for cardiac follow up. I saw her as a new patient 03/13/13 for a second cardiology opinion. She had been followed by Dr. Bryan Lemma in our practice. She has had chronic chest pain with normal cardiac cath in 2012 and again in 2014. Echo January 2012 with normal LV function, no valve issues. She has been on Norvasc and has tolerated this well.   She is here today for follow up. The patient denies any chest pain, dyspnea, palpitations, orthopnea, PND, dizziness, near syncope or syncope. She c/o her hands being cold. She has occasional LE edema at the end of the day. Resolves at night.   Primary Care Physician: Thayer Headings, MD  Past Medical History:  Diagnosis Date  . Allergy   . Anemia   . Anxiety   . Asthma   . H/O exercise stress test 2008; October 2014   was normal in 2008; positive for ischemia with inferior and lateral ST depressions October 2014  . Hx of echocardiogram 091/2012   relatively normal as well. No significant valvar lesions. Normal Ef.  . Hypertension   . Migraines   . Palpitations     Past Surgical History:  Procedure Laterality Date  . ABDOMINAL EXPLORATION SURGERY  1980  . CARDIAC CATHETERIZATION  04/2010   with no evidence of ischemia or significant coronary disease to speak of, normal LV function with relatively normal EDP.  Marland Kitchen LEFT HEART CATHETERIZATION WITH CORONARY ANGIOGRAM N/A 03/18/2013   Procedure: LEFT HEART CATHETERIZATION WITH CORONARY ANGIOGRAM;  Surgeon: Kathleene Hazel, MD;  Location: [facility] CATH LAB;  Service: Cardiovascular;  Laterality: N/A;  . NASAL SINUS SURGERY      Current Outpatient Medications  Medication Sig Dispense Refill  . acetaminophen (TYLENOL) 325 MG tablet Take 500 mg by mouth every 6 (six) hours as needed for  mild pain.     Marland Kitchen amLODipine (NORVASC) 2.5 MG tablet TAKE 2 TABLETS BY MOUTH TWICE DAILY 120 tablet 4  . EPINEPHrine (EPIPEN 2-PAK) 0.3 mg/0.3 mL IJ SOAJ injection as directed.    Marland Kitchen FLUoxetine (PROZAC) 10 MG tablet Take 10 mg by mouth daily.     Marland Kitchen LORazepam (ATIVAN) 1 MG tablet Take 0.5 mg by mouth daily as needed. Take 1/2 to 1 maximum twice daily for anxiety only as needed    . Multiple Vitamin (MULTIVITAMIN) capsule Take 1 capsule by mouth daily.     Marland Kitchen PROAIR HFA 108 (90 Base) MCG/ACT inhaler Inhale 2 puffs into the lungs 3 times/day as needed-between meals & bedtime for wheezing.  1  . PROCTOFOAM HC rectal foam Place 1 application rectally 3 times/day as needed-between meals & bedtime for hemorrhoids.  1  . ranitidine (ZANTAC) 75 MG tablet Take 150 mg by mouth 2 (two) times a week.     . furosemide (LASIX) 20 MG tablet Take one tablet by mouth daily as needed for swelling 30 tablet 10   No current facility-administered medications for this visit.     Allergies  Allergen Reactions  . Codeine Other (See Comments)    Other reaction(s): Other (See Comments) Pounding in head  Pounding in head   . Amoxicillin     Diarrhea and vomiting.  Has patient had a PCN reaction causing immediate rash, facial/tongue/throat swelling,  SOB or lightheadedness with hypotension: No Has patient had a PCN reaction causing severe rash involving mucus membranes or skin necrosis: No Has patient had a PCN reaction that required hospitalization No Has patient had a PCN reaction occurring within the last 10 years: No If all of the above answers are "NO", then may proceed with Cephalosporin use.   . Augmentin [Amoxicillin-Pot Clavulanate]     Diarrhea and vomiting.   . Bee Venom     Swelling  . Latex Hives  . Milk-Related Compounds     Diarrhea & gas  . Shellfish Allergy     HIVES, SWELLING, N/V & DIARREHA  . Sulfa Antibiotics   . Sulfur     Social History   Socioeconomic History  . Marital status:  Single    Spouse name: Not on file  . Number of children: 0  . Years of education: Not on file  . Highest education level: Not on file  Social Needs  . Financial resource strain: Not on file  . Food insecurity - worry: Not on file  . Food insecurity - inability: Not on file  . Transportation needs - medical: Not on file  . Transportation needs - non-medical: Not on file  Occupational History  . Occupation: Retired Chartered loss adjusterschoolteacher  Tobacco Use  . Smoking status: Never Smoker  . Smokeless tobacco: Never Used  Substance and Sexual Activity  . Alcohol use: No    Alcohol/week: 0.0 oz  . Drug use: No  . Sexual activity: Not on file  Other Topics Concern  . Not on file  Social History Narrative   Single woman, who lives alone. 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   She does not smoke or drink alcohol.   She occasionally exercises walking on a treadmill.    Family History  Problem Relation Age of Onset  . Hypertension Mother   . Asthma Mother   . Anemia Mother   . Hypertension Father   . Stroke Father   . Stroke Maternal Grandmother   . Diabetes Maternal Grandmother   . Cancer Maternal Grandfather   . Diabetes Paternal Grandmother   . Heart attack Paternal Grandfather   . Cancer Paternal Grandfather   . Diabetes Paternal Grandfather   . Atrial fibrillation Brother   . Stroke Brother   . Hypertension Unknown   . Hypertension Sister   . Hypertension Brother     Review of Systems:  As stated in the HPI and otherwise negative.   BP 104/70   Pulse 64   Ht 5' 1.5" (1.562 m)   Wt 167 lb 6.4 oz (75.9 kg)   LMP 11/16/2009   SpO2 98%   BMI 31.12 kg/m   Physical Examination:  General: Well developed, well nourished, NAD  HEENT: OP clear, mucus membranes moist  SKIN: warm, dry. No rashes. Neuro: No focal deficits  Musculoskeletal: Muscle strength 5/5 all ext    Psychiatric: Mood and affect normal  Neck: No JVD, no carotid bruits, no thyromegaly, no lymphadenopathy.  Lungs:Clear bilaterally, no wheezes, rhonci, crackles Cardiovascular: Regular rate and rhythm. No murmurs, gallops or rubs. Abdomen:Soft. Bowel sounds present. Non-tender.  Extremities: No lower extremity edema. Pulses are 2 + in the bilateral DP/PT.  Cardiac cath 03/18/13: Left main: No obstructive disease.  Left Anterior Descending Artery: Large caliber vessel that courses to the apex. There are several small caliber  diagonal branches. No obstructive disease.  Circumflex Artery: Large caliber vessel with large obtuse marginal branch. No obstructive disease.  Right Coronary Artery: Large dominant vessel with no obstructive disease.  Left Ventricular Angiogram: LVEF=65%.  Impression:  1. No angiographic evidence of CAD  2. Normal LV systolic function  3. Chest pain, cannot fully exclude coronary vasospasm with ST depression on treadmill exercise test.   EKG:  EKG is  ordered today. The ekg ordered today demonstrates NSR, rate 64 bpm. Poor R wave progression. Unchanged.   Recent Labs: No results found for requested labs within last 8760 hours.   Lipid Panel No results found for: CHOL, TRIG, HDL, CHOLHDL, VLDL, LDLCALC, LDLDIRECT   Wt Readings from Last 3 Encounters:  06/29/17 167 lb 6.4 oz (75.9 kg)  06/27/16 165 lb (74.8 kg)  01/20/16 162 lb 1.9 oz (73.5 kg)     Other studies Reviewed: Additional studies/ records that were reviewed today include: . 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 No recent chest pain. Continue Norvasc.    2. HTN: BP is well controlled. Continue Norvasc.   3. Lower extremity edema: She has had mild LE edema. She has been using an "old" fluid pill. Not sure what it is. I will arrange an  echocardiogram to assess LVEF. Last echo in 2012. I will give her Lasix 20 mg to use prn.   Current medicines are reviewed at length with the patient today.  The patient does not have concerns regarding medicines.  The following changes have been made:  no change  Labs/ tests ordered today include:   Orders Placed This Encounter  Procedures  . EKG 12-Lead  . ECHOCARDIOGRAM COMPLETE    Disposition:   FU with me in 12  months  Signed, Verne Carrow, MD 06/29/2017 8:34 AM    [facility] [address] [place], [place], [place]  [address] Phone: [phone]; Fax: [phone]

## 2017-06-29 NOTE — Patient Instructions (Signed)
Medication Instructions:  Your physician recommends that you continue on your current medications as directed. Please refer to the Current Medication list given to you today. May take furosemide 20 mg by mouth daily as needed for swelling   Labwork: none  Testing/Procedures: Your physician has requested that you have an echocardiogram. Echocardiography is a painless test that uses sound waves to create images of your heart. It provides your doctor with information about the size and shape of your heart and how well your heart's chambers and valves are working. This procedure takes approximately one hour. There are no restrictions for this procedure.    Follow-Up: Your physician recommends that you schedule a follow-up appointment in: 12 months. Please call our office in about 9 months to schedule this appointment    Any Other Special Instructions Will Be Listed Below (If Applicable).     If you need a refill on your cardiac medications before your next appointment, please call your pharmacy.

## 2017-07-10 ENCOUNTER — Other Ambulatory Visit (HOSPITAL_COMMUNITY): Payer: BC Managed Care – PPO

## 2017-07-28 ENCOUNTER — Telehealth: Payer: Self-pay | Admitting: *Deleted

## 2017-07-28 NOTE — Telephone Encounter (Signed)
Bianca [name], Christopher D, MD  [name], [name] Cc: Dossie ArbourAdelman, Jaeceon Michelin L, RN        Thanks, She can hold off on the echo for now if she wishes. Thayer Ohmhris   Previous Messages    ----- Message -----  From: [name], [name]  Sent: 07/26/2017  2:33 PM  To: Dossie ArbourPatricia L Zailen Albarran, RN, *  Subject: echocardiogram                  Just an FYI.   Called patient to schedule echocardiogram. She stated that she did not feel that she needed the test the water pill is working well and she doesn't have anymore swelling. She says that if you feels she still needs the test, please give her a call.    Thanks

## 2017-11-13 ENCOUNTER — Other Ambulatory Visit: Payer: Self-pay | Admitting: Physician Assistant

## 2018-01-31 NOTE — Progress Notes (Signed)
r   Chief Complaint  Patient presents with  . Follow-up    coronary vasospasm    History of Present Illness: 62 yo female with history of chest pain, HTN, asthma, anxiety and suspicion for coronary vasospasm who is here today for cardiac follow up. I saw her as a new patient 03/13/13 for a second cardiology opinion. She had been followed by Dr. Bryan Lemma in our practice. She has had chronic chest pain with a normal cardiac cath in 2012 and again in 2014. 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 Echo January 2012 with normal LV function, no valve issues. She has been on Norvasc and has tolerated this well. I saw her in March 2019 and she c/o mild edema. I ordered an echo and started Lasix. She cancelled the echo.   She is here today for follow up. The patient denies any chest pain, dyspnea, palpitations, lower extremity edema, orthopnea, PND, dizziness, near syncope or syncope. She thinks her Norvasc is causing her hair to fall out.   Primary Care Physician: Tally Joe, MD  Past Medical History:  Diagnosis Date  . Allergy   . Anemia   . Anxiety   . Asthma   . H/O exercise stress test 2008; October 2014   was normal in 2008; positive for ischemia with inferior and lateral ST depressions October 2014  . Hx of echocardiogram 091/2012   relatively normal as well. No significant valvar lesions. Normal Ef.  . Hypertension   . Migraines   . Palpitations     Past Surgical History:  Procedure Laterality Date  . ABDOMINAL EXPLORATION SURGERY  1980  . CARDIAC CATHETERIZATION  04/2010   with no evidence of ischemia or significant coronary disease to speak of, normal LV function with relatively normal EDP.  Marland Kitchen LEFT HEART CATHETERIZATION WITH CORONARY ANGIOGRAM N/A 03/18/2013   Procedure: LEFT HEART CATHETERIZATION WITH CORONARY ANGIOGRAM;  Surgeon: Kathleene Hazel, MD;  Location: [facility] CATH LAB;  Service: Cardiovascular;  Laterality: N/A;  . NASAL SINUS SURGERY      Current Outpatient  Medications  Medication Sig Dispense Refill  . acetaminophen (TYLENOL) 325 MG tablet Take 500 mg by mouth every 6 (six) hours as needed for mild pain.     Marland Kitchen EPINEPHrine (EPIPEN 2-PAK) 0.3 mg/0.3 mL IJ SOAJ injection as directed.    Marland Kitchen FLUoxetine (PROZAC) 10 MG tablet Take 10 mg by mouth daily.     . furosemide (LASIX) 20 MG tablet Take one tablet by mouth daily as needed for swelling 30 tablet 10  . LORazepam (ATIVAN) 1 MG tablet Take 0.5 mg by mouth daily as needed. Take 1/2 to 1 maximum twice daily for anxiety only as needed    . Multiple Vitamin (MULTIVITAMIN) capsule Take 1 capsule by mouth daily.     Marland Kitchen PROAIR HFA 108 (90 Base) MCG/ACT inhaler Inhale 2 puffs into the lungs 3 times/day as needed-between meals & bedtime for wheezing.  1  . PROCTOFOAM HC rectal foam Place rectally 3 times/day as needed-between meals & bedtime for hemorrhoids. 10 g 0  . ranitidine (ZANTAC) 75 MG tablet Take 150 mg by mouth 2 (two) times a week.      No current facility-administered medications for this visit.     Allergies  Allergen Reactions  . Codeine Other (See Comments)    Other reaction(s): Other (See Comments) Pounding in head  Pounding in head   . Amoxicillin     Diarrhea and vomiting.  Has patient had a PCN reaction  causing immediate rash, facial/tongue/throat swelling, SOB or lightheadedness with hypotension: No Has patient had a PCN reaction causing severe rash involving mucus membranes or skin necrosis: No Has patient had a PCN reaction that required hospitalization No Has patient had a PCN reaction occurring within the last 10 years: No If all of the above answers are "NO", then may proceed with Cephalosporin use.   . Augmentin [Amoxicillin-Pot Clavulanate]     Diarrhea and vomiting.   . Bee Venom     Swelling  . Latex Hives  . Milk-Related Compounds     Diarrhea & gas  . Shellfish Allergy     HIVES, SWELLING, N/V & DIARREHA  . Sulfa Antibiotics   . Sulfur     Social History    Socioeconomic History  . Marital status: Single    Spouse name: Not on file  . Number of children: 0  . Years of education: Not on file  . Highest education level: Not on file  Occupational History  . Occupation: Retired Armed forces technical officer  . Financial resource strain: Not on file  . Food insecurity:    Worry: Not on file    Inability: Not on file  . Transportation needs:    Medical: Not on file    Non-medical: Not on file  Tobacco Use  . Smoking status: Never Smoker  . Smokeless tobacco: Never Used  Substance and Sexual Activity  . Alcohol use: No    Alcohol/week: 0.0 standard drinks  . Drug use: No  . Sexual activity: Not on file  Lifestyle  . Physical activity:    Days per week: Not on file    Minutes per session: Not on file  . Stress: Not on file  Relationships  . Social connections:    Talks on phone: Not on file    Gets together: Not on file    Attends religious service: Not on file    Active member of club or organization: Not on file    Attends meetings of clubs or organizations: Not on file    Relationship status: Not on file  . Intimate partner violence:    Fear of current or ex partner: Not on file    Emotionally abused: Not on file    Physically abused: Not on file    Forced sexual activity: Not on file  Other Topics Concern  . Not on file  Social History Narrative   Single woman, who lives alone. 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   She does not smoke or drink alcohol.   She occasionally exercises walking on a treadmill.    Family History  Problem Relation Age of Onset  . Hypertension Mother   . Asthma Mother   . Anemia Mother   . Hypertension Father   . Stroke Father   . Stroke Maternal Grandmother   . Diabetes Maternal Grandmother   . Cancer Maternal Grandfather   . Diabetes Paternal Grandmother   . Heart attack  Paternal Grandfather   . Cancer Paternal Grandfather   . Diabetes Paternal Grandfather   . Atrial fibrillation Brother   . Stroke Brother   . Hypertension Unknown   . Hypertension Sister   . Hypertension Brother     Review of Systems:  As stated in the HPI and otherwise negative.   BP 112/68   Pulse 65   Ht  5' 1.5" (1.562 m)   Wt 169 lb 6.4 oz (76.8 kg)   LMP 11/16/2009   SpO2 98%   BMI 31.49 kg/m   Physical Examination:  General: Well developed, well nourished, NAD  HEENT: OP clear, mucus membranes moist  SKIN: warm, dry. No rashes. Neuro: No focal deficits  Musculoskeletal: Muscle strength 5/5 all ext  Psychiatric: Mood and affect normal  Neck: No JVD, no carotid bruits, no thyromegaly, no lymphadenopathy.  Lungs:Clear bilaterally, no wheezes, rhonci, crackles Cardiovascular: Regular rate and rhythm. No murmurs, gallops or rubs. Abdomen:Soft. Bowel sounds present. Non-tender.  Extremities: No lower extremity edema. Pulses are 2 + in the bilateral DP/PT.  Cardiac cath 03/18/13: Left main: No obstructive disease.  Left Anterior Descending Artery: Large caliber vessel that courses to the apex. There are several small caliber diagonal branches. No obstructive disease.  Circumflex Artery: Large caliber vessel with large obtuse marginal branch. No obstructive disease.  Right Coronary Artery: Large dominant vessel with no obstructive disease.  Left Ventricular Angiogram: LVEF=65%.  Impression:  1. No angiographic evidence of CAD  2. Normal LV systolic function  3. Chest pain, cannot fully exclude coronary vasospasm with ST depression on treadmill exercise test.   EKG:  EKG is not ordered today. The ekg ordered today demonstrates   Recent Labs: No results found for requested labs within last 8760 hours.   Lipid Panel No results found for: CHOL, TRIG, HDL, CHOLHDL, VLDL, LDLCALC, LDLDIRECT   Wt Readings from Last 3 Encounters:  02/01/18 169 lb 6.4 oz (76.8 kg)    06/29/17 167 lb 6.4 oz (75.9 kg)  06/27/16 165 lb (74.8 kg)     Other studies Reviewed: Additional studies/ records that were reviewed today include: . 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 No chest pain on Norvasc but she thinks the Norvasc may be making her hair fall out. I will stop her Norvasc. She does not wish to try Imdur. If she has recurrent chest pain may have to consider Imdur.    2. HTN: BP is well controlled on Norvasc. She thinks her hair is falling out due to the Norvasc. Will stop Norvasc. She will follow her BP at home and if elevated, she will call. We could consider restarting Metoprolol or starting Cozaar if her BP is elevated. No changes today.   3. Lower extremity edema: Resolved on Lasix.   4. Hemorrhoids: Will refill her proctofoam  Current medicines are reviewed at length with the patient today.  The patient does not have concerns regarding medicines.  The following changes have been made:  no change  Labs/ tests ordered today include:   No orders of the defined types were placed in this encounter.   Disposition:   FU with me in 12  months  Signed, Verne Carrow, MD 02/01/2018 8:42 AM    [facility] [address] [place], [place], [place]  [address] Phone: [phone]; Fax: [phone]

## 2018-02-01 ENCOUNTER — Encounter: Payer: Self-pay | Admitting: Cardiovascular Disease

## 2018-02-01 ENCOUNTER — Encounter (INDEPENDENT_AMBULATORY_CARE_PROVIDER_SITE_OTHER): Payer: Self-pay

## 2018-02-01 ENCOUNTER — Ambulatory Visit: Payer: BC Managed Care – PPO | Admitting: Cardiovascular Disease

## 2018-02-01 VITALS — BP 112/68 | HR 65 | Ht 61.5 in | Wt 169.4 lb

## 2018-02-01 DIAGNOSIS — I201 Angina pectoris with documented spasm: Secondary | ICD-10-CM

## 2018-02-01 DIAGNOSIS — R600 Localized edema: Secondary | ICD-10-CM

## 2018-02-01 DIAGNOSIS — K648 Other hemorrhoids: Secondary | ICD-10-CM | POA: Diagnosis not present

## 2018-02-01 DIAGNOSIS — I10 Essential (primary) hypertension: Secondary | ICD-10-CM | POA: Diagnosis not present

## 2018-02-01 MED ORDER — PROCTOFOAM HC 1-1 % RE FOAM: Freq: Two times a day (BID) | RECTAL | 0 refills | Status: DC | PRN

## 2018-02-01 NOTE — Patient Instructions (Signed)
Medication Instructions:  Your physician has recommended you make the following change in your medication: Stop Amlodipine  If you need a refill on your cardiac medications before your next appointment, please call your pharmacy.   Lab work: none If you have labs (blood work) drawn today and your tests are completely normal, you will receive your results only by: Marland Kitchen MyChart Message (if you have MyChart) OR . A paper copy in the mail If you have any lab test that is abnormal or we need to change your treatment, we will call you to review the results.  Testing/Procedures: none  Follow-Up: At [facility], you and your health needs are our priority.  As part of our continuing mission to provide you with exceptional heart care, we have created designated Provider Care Teams.  These Care Teams include your primary Cardiologist (physician) and Advanced Practice Providers (APPs -  Physician Assistants and Nurse Practitioners) who all work together to provide you with the care you need, when you need it. You will need a follow up appointment in 12 months.  Please call our office 2 months in advance to schedule this appointment.  You may see Verne Carrow, MD or one of the following Advanced Practice Providers on your designated Care Team:   [place], PA-C Ronie Spies, PA-C . Jacolyn Reedy, PA-C  Any Other Special Instructions Will Be Listed Below (If Applicable).   Check blood pressure at home and call us or send my chart message if blood pressure becomes elevated.

## 2018-02-06 NOTE — Telephone Encounter (Signed)
Bianca [name], See [employer]. Can we call in Metoprolol 25 mg po BID? Thanks, chris

## 2018-02-07 MED ORDER — METOPROLOL TARTRATE 25 MG PO TABS: 25.0000 mg | ORAL_TABLET | Freq: Two times a day (BID) | ORAL | 11 refills | Status: DC

## 2018-02-07 NOTE — Addendum Note (Signed)
Addended by: Dossie Arbour on: 02/07/2018 08:47 AM   Modules accepted: Orders

## 2018-02-14 MED ORDER — LOSARTAN POTASSIUM 25 MG PO TABS: 25.0000 mg | ORAL_TABLET | Freq: Every day | ORAL | 11 refills | Status: DC

## 2018-02-14 NOTE — Telephone Encounter (Signed)
Bianca Hazel, MD  Dossie Arbour, RN        Bianca [name], Bianca [name] did not tolerate the beta blocker. I asked her to stop it. See MyChart message. Can we send in Cozaar 25 mg once daily? Thanks, chris

## 2018-03-14 MED ORDER — AMLODIPINE BESYLATE 2.5 MG PO TABS: 2.5000 mg | ORAL_TABLET | Freq: Two times a day (BID) | ORAL | 3 refills | Status: DC

## 2018-05-23 ENCOUNTER — Ambulatory Visit
Admission: RE | Admit: 2018-05-23 | Discharge: 2018-05-23 | Disposition: A | Discharge status: Home / Self Care | Payer: BC Managed Care – PPO | Source: Ambulatory Visit | Attending: Family Medicine | Admitting: Family Medicine

## 2018-05-23 ENCOUNTER — Other Ambulatory Visit: Payer: Self-pay | Admitting: Family Medicine

## 2018-05-23 DIAGNOSIS — S90121A Contusion of right lesser toe(s) without damage to nail, initial encounter: Secondary | ICD-10-CM

## 2018-07-03 ENCOUNTER — Ambulatory Visit: Payer: BC Managed Care – PPO | Admitting: Allergy and Immunology

## 2018-07-03 ENCOUNTER — Encounter: Payer: Self-pay | Admitting: Allergy and Immunology

## 2018-07-03 ENCOUNTER — Other Ambulatory Visit: Payer: Self-pay

## 2018-07-03 VITALS — BP 118/70 | HR 70 | Temp 98.0°F | Resp 16 | Ht 61.0 in | Wt 161.0 lb

## 2018-07-03 DIAGNOSIS — H1045 Other chronic allergic conjunctivitis: Secondary | ICD-10-CM | POA: Diagnosis not present

## 2018-07-03 DIAGNOSIS — H04123 Dry eye syndrome of bilateral lacrimal glands: Secondary | ICD-10-CM | POA: Diagnosis not present

## 2018-07-03 DIAGNOSIS — L718 Other rosacea: Secondary | ICD-10-CM | POA: Diagnosis not present

## 2018-07-03 DIAGNOSIS — T7800XD Anaphylactic reaction due to unspecified food, subsequent encounter: Secondary | ICD-10-CM

## 2018-07-03 DIAGNOSIS — H1013 Acute atopic conjunctivitis, bilateral: Secondary | ICD-10-CM

## 2018-07-03 DIAGNOSIS — J3089 Other allergic rhinitis: Secondary | ICD-10-CM

## 2018-07-03 MED ORDER — DOXYCYCLINE HYCLATE 50 MG PO CAPS: 50.0000 mg | ORAL_CAPSULE | Freq: Every day | ORAL | 0 refills | Status: DC

## 2018-07-03 MED ORDER — LOTEPREDNOL ETABONATE 0.5 % OP SUSP: 1.0000 [drp] | Freq: Every day | OPHTHALMIC | 5 refills | Status: DC

## 2018-07-03 MED ORDER — PREDNISONE 10 MG PO TABS: ORAL_TABLET | ORAL | 0 refills | Status: AC

## 2018-07-03 MED ORDER — AUVI-Q 0.3 MG/0.3ML IJ SOAJ: 0.3000 mg | INTRAMUSCULAR | 1 refills | Status: DC | PRN

## 2018-07-03 MED ORDER — OLOPATADINE HCL 0.2 % OP SOLN: 1.0000 [drp] | Freq: Every day | OPHTHALMIC | 5 refills | Status: DC

## 2018-07-03 MED ORDER — AZELASTINE-FLUTICASONE 137-50 MCG/ACT NA SUSP: 2.0000 | NASAL | 5 refills | Status: DC

## 2018-07-03 NOTE — Progress Notes (Signed)
[place] - [place]   NEW PATIENT NOTE  Referring Provider: No ref. provider found Primary Provider: Tally Joe, MD Date of office visit: 07/03/2018    Subjective:   Chief Complaint:  Bianca [name] (DOB: 08/19/1955) is a 63 y.o. female who presents to the clinic on 07/03/2018 with a chief complaint of Allergies .  HPI: Bianca [name] presents to this clinic in evaluation of allergies and eye issues.  Apparently I had seen her back in this clinic many years ago for issues with allergic rhinoconjunctivitis and shellfish allergy.  She states that her longstanding condition changed in October 2019.  At that point in time she started to develop very significant eye itching and watering and scratching of her eyes and rubbing of her eyes and swelling of her eyes for which she has seen 3 different ophthalmologist and has been treated with multiple types of eyedrops.  The only medical therapy that has helped is the administration of an ointment containing dexamethasone.  Apparently she was offered Restasis at one point but this did not help her as well.  She has not had any change in her vision other than the fact that when she was recently evaluated for a new set of glasses she apparently did have some decreased visual acuity at distance. She was wearing contacts up until this issue and then had to decrease the use of contacts about twice a week since October 2019 and has been without any contacts for the past 4 weeks.  She does occasionally get some nasal congestion and sneezing but for the most part this appears to be under pretty good control while using some nasal fluticasone.  She did use various antihistamines in the past but she cannot use antihistamines on a long-term basis because they dry out her eyes and dry out her skin and give her problems with constipation.  She does not really have a childhood history of atopic dermatitis but she has noticed that  occasionally her skin gets a little bit itchy especially on the back of her neck.  She does note that she develops problems with redness of her face and occasionally her neck especially if she gets emotional.  She has also noticed a intermittent red rash come up on her eyelids.  There has not really been an obvious environmental trigger that has given rise to this issue.  She has not had a change at home or at work and she is not using any new medications that may have given rise to her eye issue.  She did have exposure to "pinkeye" sometime this fall but that had apparently resolved with in a week or 2.  She remains away from consuming all shellfish.  Past Medical History:  Diagnosis Date  . Allergy   . Anemia   . Anxiety   . Asthma   . H/O exercise stress test 2008; October 2014   was normal in 2008; positive for ischemia with inferior and lateral ST depressions October 2014  . Hx of echocardiogram 091/2012   relatively normal as well. No significant valvar lesions. Normal Ef.  . Hypertension   . Migraines   . Palpitations     Past Surgical History:  Procedure Laterality Date  . ABDOMINAL EXPLORATION SURGERY  1980  . CARDIAC CATHETERIZATION  04/2010   with no evidence of ischemia or significant coronary disease to speak of, normal LV function with relatively normal EDP.  Marland Kitchen LEFT HEART CATHETERIZATION  WITH CORONARY ANGIOGRAM N/A 03/18/2013   Procedure: LEFT HEART CATHETERIZATION WITH CORONARY ANGIOGRAM;  Surgeon: Kathleene Hazel, MD;  Location: [facility] CATH LAB;  Service: Cardiovascular;  Laterality: N/A;  . NASAL SINUS SURGERY      Allergies as of 07/03/2018      Reactions   Codeine Other (See Comments)   Other reaction(s): Other (See Comments) Pounding in head  Pounding in head    Amoxicillin    Diarrhea and vomiting.  Has patient had a PCN reaction causing immediate rash, facial/tongue/throat swelling, SOB or lightheadedness with hypotension: No Has patient had a PCN  reaction causing severe rash involving mucus membranes or skin necrosis: No Has patient had a PCN reaction that required hospitalization No Has patient had a PCN reaction occurring within the last 10 years: No If all of the above answers are "NO", then may proceed with Cephalosporin use.   Augmentin [amoxicillin-pot Clavulanate]    Diarrhea and vomiting.    Bee Venom    Swelling   Latex Hives   Metoprolol Other (See Comments)   Hair loss   Milk-related Compounds    Diarrhea & gas   Shellfish Allergy    HIVES, SWELLING, N/V & DIARREHA   Sulfa Antibiotics    Sulfur       Medication List      acetaminophen 325 MG tablet Commonly known as:  TYLENOL Take 500 mg by mouth every 6 (six) hours as needed for mild pain.   amLODipine 2.5 MG tablet Commonly known as:  NORVASC Take 1 tablet (2.5 mg total) by mouth 2 (two) times daily.   diphenhydrAMINE 25 MG tablet Commonly known as:  BENADRYL Take 25 mg by mouth every 6 (six) hours as needed.   EpiPen 2-Pak 0.3 mg/0.3 mL Soaj injection Generic drug:  EPINEPHrine as directed.   FLUoxetine 10 MG tablet Commonly known as:  PROZAC Take 10 mg by mouth daily.   fluticasone 50 MCG/ACT nasal spray Commonly known as:  FLONASE Place into the nose.   furosemide 20 MG tablet Commonly known as:  LASIX Take one tablet by mouth daily as needed for swelling   guaiFENesin 600 MG 12 hr tablet Commonly known as:  MUCINEX Take by mouth 2 (two) times daily.   LORazepam 1 MG tablet Commonly known as:  ATIVAN Take 0.5 mg by mouth daily as needed. Take 1/2 to 1 maximum twice daily for anxiety only as needed   MAGNESIUM CITRATE PO Take by mouth.   multivitamin capsule Take 1 capsule by mouth daily.   neomycin-polymyxin b-dexamethasone 3.5-10000-0.1 Oint Commonly known as:  MAXITROL APP 1 THIN LAYER IN OU BID FOR 10 DAYS   psyllium 58.6 % powder Commonly known as:  METAMUCIL Take 1 packet by mouth 3 (three) times daily.        Review of systems negative except as noted in HPI / PMHx or noted below:  Review of Systems  Constitutional: Negative.   HENT: Negative.   Eyes: Negative.   Respiratory: Negative.   Cardiovascular: Negative.   Gastrointestinal: Negative.   Genitourinary: Negative.   Musculoskeletal: Negative.   Skin: Negative.   Neurological: Negative.   Endo/Heme/Allergies: Negative.   Psychiatric/Behavioral: Negative.     Family History  Problem Relation Age of Onset  . Hypertension Mother   . Asthma Mother   . Anemia Mother   . Hypertension Father   . Stroke Father   . Stroke Maternal Grandmother   . Diabetes Maternal Grandmother   .  Cancer Maternal Grandfather   . Diabetes Paternal Grandmother   . Heart attack Paternal Grandfather   . Cancer Paternal Grandfather   . Diabetes Paternal Grandfather   . Atrial fibrillation Brother   . Stroke Brother   . Hypertension Other   . Hypertension Sister   . Hypertension Brother     Social History   Socioeconomic History  . Marital status: Single    Spouse name: Not on file  . Number of children: 0  . Years of education: Not on file  . Highest education level: Not on file  Occupational History  . Occupation: Retired Armed forces technical officer  . Financial resource strain: Not on file  . Food insecurity:    Worry: Not on file    Inability: Not on file  . Transportation needs:    Medical: Not on file    Non-medical: Not on file  Tobacco Use  . Smoking status: Never Smoker  . Smokeless tobacco: Never Used  Substance and Sexual Activity  . Alcohol use: No    Alcohol/week: 0.0 standard drinks  . Drug use: No  . Sexual activity: Not on file  Lifestyle  . Physical activity:    Days per week: Not on file    Minutes per session: Not on file  . Stress: Not on file  Relationships  . Social connections:    Talks on phone: Not on file    Gets together: Not on file    Attends religious service: Not on file    Active member of club  or organization: Not on file    Attends meetings of clubs or organizations: Not on file    Relationship status: Not on file  . Intimate partner violence:    Fear of current or ex partner: Not on file    Emotionally abused: Not on file    Physically abused: Not on file    Forced sexual activity: Not on file  Other Topics Concern  . Not on file  Social History Narrative   Single woman, who lives alone. 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   She does not smoke or drink alcohol.   She occasionally exercises walking on a treadmill.    Environmental and Social history  Lives in a condominium with a dry environment, no animals located inside the household, carpet in the bedroom, plastic on the bed, no plastic on the pillow, no smokers located inside the household.  She is a Runner, broadcasting/film/video.  Objective:   Vitals:   07/03/18 0931  BP: 118/70  Pulse: 70  Resp: 16  Temp: 98 F (36.7 C)  SpO2: 95%   Height:  (154.9 cm) Weight: 161 lb (73 kg)  Physical Exam Constitutional:      Appearance: She is not diaphoretic.  HENT:     Head: Normocephalic. No right periorbital erythema or left periorbital erythema.     Right Ear: Tympanic membrane, ear canal and external ear normal.     Left Ear: Tympanic membrane, ear canal and external ear normal.     Nose: Nose normal. No mucosal edema or rhinorrhea.     Mouth/Throat:     Pharynx: No oropharyngeal exudate.  Eyes:     General: Lids are normal.     Conjunctiva/sclera:     Right eye: Right conjunctiva is injected.     Left eye: Left conjunctiva  is injected.     Pupils: Pupils are equal, round, and reactive to light.  Neck:     Thyroid: No thyromegaly.     Trachea: Trachea normal. No tracheal deviation.  Cardiovascular:     Rate and Rhythm: Normal rate and regular rhythm.     Heart sounds: Normal heart sounds, S1 normal and S2  normal. No murmur.  Pulmonary:     Effort: Pulmonary effort is normal. No respiratory distress.     Breath sounds: No stridor. No wheezing or rales.  Chest:     Chest wall: No tenderness.  Abdominal:     General: There is no distension.     Palpations: Abdomen is soft. There is no mass.     Tenderness: There is no abdominal tenderness. There is no guarding or rebound.  Musculoskeletal:        General: No tenderness.  Lymphadenopathy:     Head:     Right side of head: No tonsillar adenopathy.     Left side of head: No tonsillar adenopathy.     Cervical: No cervical adenopathy.  Skin:    Coloration: Skin is not pale.     Findings: Rash (Slight erythema of the malar region and upper eyelids) present. No erythema.     Nails: There is no clubbing.   Neurological:     Mental Status: She is alert.     Diagnostics: Allergy skin tests were performed.  She demonstrated hypersensitivity against house dust mite.  Assessment and Plan:    1. Perennial allergic rhinitis   2. Perennial allergic conjunctivitis of both eyes   3. Dry eye syndrome of both eyes   4. 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  Allergen avoidance measures.  Avoid all oral antihistamines  2.  Use the following medications every day:   A. Dymista - 1 spray each nostril 1 time per day  B. Generic Lotemax - 1 drop each eye 1 time per day  C. Generic Pataday - 1 drop each eye 1 time per day  D. doxycycline 50mg  - 1 tablet 1 time per day  3. Use Systane gel drops at night before bedtime. Use Systane drop multiple times per day  4. Auvi-Q 0.3 / Epi-pen if needed  5. Prednisone 10mg  - 1 tablet 1 times per day for 5 days, then 1/2 tablet 1 time per day for 5 days.  6. Return to clinic in 3 weeks or earlier if problem   Bianca [name] has several insults to her conjunctiva including her atopic disease, dry eye syndrome, and possible component of ocular rosacea for which we will  have her perform allergen avoidance measures, remain away from consuming any oral antihistamines, using a collection of anti-inflammatory agents as noted above and starting her on doxycycline.  I have renewed her injectable epinephrine device for her food allergy directed against shellfish.  I will see her back in this clinic in 3 weeks to assess her response to this approach.  Laurette Schimke, MD Allergy / Immunology [facility]

## 2018-07-03 NOTE — Patient Instructions (Addendum)
  1.  Allergen avoidance measures.  Avoid all oral antihistamines  2.  Use the following medications every day:   A. Dymista - 1 spray each nostril 1 time per day  B. Generic Lotemax - 1 drop each eye 1 time per day  C. Generic Pataday - 1 drop each eye 1 time per day  D. doxycycline 50mg  - 1 tablet 1 time per day  3. Use Systane gel drops at night before bedtime. Use Systane drop multiple times per day  4. Auvi-Q 0.3 / Epi-pen if needed  5. Prednisone 10mg  - 1 tablet 1 times per day for 5 days, then 1/2 tablet 1 time per day for 5 days.  6. Return to clinic in 3 weeks or earlier if problem

## 2018-07-05 ENCOUNTER — Encounter: Payer: Self-pay | Admitting: Allergy and Immunology

## 2018-07-11 ENCOUNTER — Telehealth: Payer: Self-pay | Admitting: Allergy and Immunology

## 2018-07-11 MED ORDER — AZITHROMYCIN 500 MG PO TABS: 500.0000 mg | ORAL_TABLET | Freq: Every day | ORAL | 0 refills | Status: DC

## 2018-07-11 NOTE — Telephone Encounter (Signed)
Dr Kozlow please advise 

## 2018-07-11 NOTE — Telephone Encounter (Signed)
Patient was put on prednisone and an antibiotic last week by Dr. Lucie Leather.  She said she still has sinus issues and a headache. She would like to know if he can prescribe another antibiotic. [facility] on [address]

## 2018-07-11 NOTE — Telephone Encounter (Signed)
Rx sent. Pt notified and verbalized understanding.

## 2018-07-11 NOTE — Telephone Encounter (Signed)
She can use azithromycin 500 mg once a day for 3 days only. This should remain in her body for 10 days.

## 2018-07-24 ENCOUNTER — Other Ambulatory Visit: Payer: Self-pay

## 2018-07-24 ENCOUNTER — Encounter: Payer: Self-pay | Admitting: Allergy and Immunology

## 2018-07-24 ENCOUNTER — Ambulatory Visit: Payer: BC Managed Care – PPO | Admitting: Allergy and Immunology

## 2018-07-24 VITALS — BP 120/78 | HR 65 | Temp 98.8°F | Resp 16 | Ht 61.0 in | Wt 162.0 lb

## 2018-07-24 DIAGNOSIS — J3089 Other allergic rhinitis: Secondary | ICD-10-CM | POA: Diagnosis not present

## 2018-07-24 DIAGNOSIS — H1045 Other chronic allergic conjunctivitis: Secondary | ICD-10-CM

## 2018-07-24 DIAGNOSIS — L718 Other rosacea: Secondary | ICD-10-CM | POA: Diagnosis not present

## 2018-07-24 DIAGNOSIS — T7800XD Anaphylactic reaction due to unspecified food, subsequent encounter: Secondary | ICD-10-CM

## 2018-07-24 DIAGNOSIS — H04123 Dry eye syndrome of bilateral lacrimal glands: Secondary | ICD-10-CM | POA: Diagnosis not present

## 2018-07-24 DIAGNOSIS — H1013 Acute atopic conjunctivitis, bilateral: Secondary | ICD-10-CM

## 2018-07-24 MED ORDER — LOTEPREDNOL ETABONATE 0.5 % OP SUSP: 1.0000 [drp] | Freq: Two times a day (BID) | OPHTHALMIC | 5 refills | Status: DC

## 2018-07-24 MED ORDER — AZELASTINE-FLUTICASONE 137-50 MCG/ACT NA SUSP: 1.0000 | Freq: Two times a day (BID) | NASAL | 5 refills | Status: DC

## 2018-07-24 MED ORDER — DOXYCYCLINE HYCLATE 50 MG PO CAPS: 50.0000 mg | ORAL_CAPSULE | Freq: Two times a day (BID) | ORAL | 5 refills | Status: DC

## 2018-07-24 NOTE — Patient Instructions (Addendum)
  1.  Allergen avoidance measures.  Avoid all oral antihistamines  2.  Use the following medications every day:   A. Increase Dymista - 1 spray each nostril 2 time per day  B. Increase Generic Lotemax - 1 drop each eye 2 time per day  C. Generic Pataday - 1 drop each eye 1 time per day  D. Increase doxycycline 50mg  - 1 tablet 2 time per day  3. Use Systane gel drops at night before bedtime. Use Systane drop multiple times per day  4. Auvi-Q 0.3 / Epi-pen if needed  5. Consider starting Immunotherapy  6. Return to clinic in 4 weeks or earlier if problem

## 2018-07-24 NOTE — Progress Notes (Signed)
[place] - [place] - [place]   Follow-up Note  Referring Provider: Tally Joe, MD Primary Provider: Tally Joe, MD Date of Office Visit: 07/24/2018  Subjective:   Bianca [name] (DOB: 20-Dec-1955) is a 63 y.o. female who [facility] in re-evaluation of the following:  HPI: Bianca [name] presents to this clinic in evaluation of her eye issues with a component of allergic conjunctivitis, dry eye syndrome, possible ocular rosacea as well as a history of allergic rhinitis and shellfish allergy.  I last saw her in this clinic on 03 July 2018 at which point in time we addressed each issue.  Concerning her eyes she did very well initially with medical therapy then slowly redeveloped the problems with eye itching and eye watering and redness of her eyes.  She continues to use 1 drop of Lotemax every day as well as Pataday 1 drop 1 time per day and continues on doxycycline 50 mg daily.  Overall her nose is doing well while using Dymista 1 spray each nostril 1 time per day.  She remains away from eating shellfish.  She does have a headache that is located right at the bridge of her nose just about every morning when she wakes up that goes away when she is in the shower and sometimes it will return when she gets ready to go to bed in the evening.  Allergies as of 07/24/2018      Reactions   Codeine Other (See Comments)   Other reaction(s): Other (See Comments) Pounding in head  Pounding in head    Amoxicillin    Diarrhea and vomiting.  Has patient had a PCN reaction causing immediate rash, facial/tongue/throat swelling, SOB or lightheadedness with hypotension: No Has patient had a PCN reaction causing severe rash involving mucus membranes or skin necrosis: No Has patient had a PCN reaction that required hospitalization No Has patient had a PCN reaction occurring within the last 10 years: No If all of the above answers  are "NO", then may proceed with Cephalosporin use.   Augmentin [amoxicillin-pot Clavulanate]    Diarrhea and vomiting.    Bee Venom    Swelling   Latex Hives   Metoprolol Other (See Comments)   Hair loss   Milk-related Compounds    Diarrhea & gas   Shellfish Allergy    HIVES, SWELLING, N/V & DIARREHA   Sulfa Antibiotics    Sulfur       Medication List      acetaminophen 325 MG tablet Commonly known as:  TYLENOL Take 500 mg by mouth every 6 (six) hours as needed for mild pain.   amLODipine 2.5 MG tablet Commonly known as:  NORVASC Take 1 tablet (2.5 mg total) by mouth 2 (two) times daily.   Auvi-Q 0.3 mg/0.3 mL Soaj injection Generic drug:  EPINEPHrine Inject 0.3 mLs (0.3 mg total) into the muscle as needed for anaphylaxis.   Azelastine-Fluticasone 137-50 MCG/ACT Susp Commonly known as:  Dymista Place 1 spray into both nostrils 2 (two) times daily.   doxycycline 50 MG capsule Commonly known as:  VIBRAMYCIN Take 1 capsule (50 mg total) by mouth 2 (two) times daily.   FLUoxetine 10 MG tablet Commonly known as:  PROZAC Take 10 mg by mouth daily.   furosemide 20 MG tablet Commonly known as:  LASIX Take one tablet by mouth daily as needed for swelling   guaiFENesin 600 MG 12 hr tablet Commonly known  as:  MUCINEX Take by mouth 2 (two) times daily.   LORazepam 1 MG tablet Commonly known as:  ATIVAN Take 0.5 mg by mouth daily as needed. Take 1/2 to 1 maximum twice daily for anxiety only as needed   loteprednol 0.5 % ophthalmic suspension Commonly known as:  LOTEMAX Place 1 drop into both eyes 2 (two) times daily.   MAGNESIUM CITRATE PO Take by mouth.   multivitamin capsule Take 1 capsule by mouth daily.   neomycin-polymyxin b-dexamethasone 3.5-10000-0.1 Oint Commonly known as:  MAXITROL APP 1 THIN LAYER IN OU BID FOR 10 DAYS   Olopatadine HCl 0.2 % Soln Place 1 drop into both eyes daily.   psyllium 58.6 % powder Commonly known as:  METAMUCIL Take 1  packet by mouth 3 (three) times daily.       Past Medical History:  Diagnosis Date  . Allergy   . Anemia   . Anxiety   . Asthma   . H/O exercise stress test 2008; October 2014   was normal in 2008; positive for ischemia with inferior and lateral ST depressions October 2014  . Hx of echocardiogram 091/2012   relatively normal as well. No significant valvar lesions. Normal Ef.  . Hypertension   . Migraines   . Palpitations     Past Surgical History:  Procedure Laterality Date  . ABDOMINAL EXPLORATION SURGERY  1980  . CARDIAC CATHETERIZATION  04/2010   with no evidence of ischemia or significant coronary disease to speak of, normal LV function with relatively normal EDP.  Marland Kitchen. LEFT HEART CATHETERIZATION WITH CORONARY ANGIOGRAM N/A 03/18/2013   Procedure: LEFT HEART CATHETERIZATION WITH CORONARY ANGIOGRAM;  Surgeon: Kathleene Hazelhristopher D McAlhany, MD;  [facility];  Service: Cardiovascular;  Laterality: N/A;  . NASAL SINUS SURGERY      Review of systems negative except as noted in HPI / PMHx or noted below:  Review of Systems  Constitutional: Negative.   HENT: Negative.   Eyes: Negative.   Respiratory: Negative.   Cardiovascular: Negative.   Gastrointestinal: Negative.   Genitourinary: Negative.   Musculoskeletal: Negative.   Skin: Negative.   Neurological: Negative.   Endo/Heme/Allergies: Negative.   Psychiatric/Behavioral: Negative.      Objective:   Vitals:   07/24/18 1754  BP: 120/78  Pulse: 65  Resp: 16  Temp: 98.8 F (37.1 C)  SpO2: 97%   Height: 5\' 1"  (154.9 cm)  Weight: 162 lb (73.5 kg)   Physical Exam Constitutional:      Appearance: She is not diaphoretic.  HENT:     Head: Normocephalic.     Right Ear: Tympanic membrane, ear canal and external ear normal.     Left Ear: Tympanic membrane, ear canal and external ear normal.     Nose: Nose normal. No mucosal edema or rhinorrhea.     Mouth/Throat:     Pharynx: Uvula midline. No oropharyngeal  exudate.  Eyes:     Conjunctiva/sclera:     Right eye: Right conjunctiva is injected.     Left eye: Left conjunctiva is injected.  Neck:     Thyroid: No thyromegaly.     Trachea: Trachea normal. No tracheal tenderness or tracheal deviation.  Cardiovascular:     Rate and Rhythm: Normal rate and regular rhythm.     Heart sounds: Normal heart sounds, S1 normal and S2 normal. No murmur.  Pulmonary:     Effort: No respiratory distress.     Breath sounds: Normal breath sounds. No stridor. No  wheezing or rales.  Lymphadenopathy:     Head:     Right side of head: No tonsillar adenopathy.     Left side of head: No tonsillar adenopathy.     Cervical: No cervical adenopathy.  Skin:    Findings: No erythema or rash.     Nails: There is no clubbing.   Neurological:     Mental Status: She is alert.     Diagnostics: none  Assessment and Plan:   1. Perennial allergic rhinitis   2. Perennial allergic conjunctivitis of both eyes   3. Dry eye syndrome of both eyes   4. 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  Allergen avoidance measures.  Avoid all oral antihistamines  2.  Use the following medications every day:   A. Increase Dymista - 1 spray each nostril 2 time per day  B. Increase Generic Lotemax - 1 drop each eye 2 time per day  C. Generic Pataday - 1 drop each eye 1 time per day  D. Increase doxycycline  - 1 tablet 2 time per day  3. Use Systane gel drops at night before bedtime. Use Systane drop multiple times per day  4. Auvi-Q 0.3 / Epi-pen if needed  5. Consider starting Immunotherapy  6. Return to clinic in 4 weeks or earlier if problem   Bianca [name] will utilize a collection of anti-inflammatory agents for both her upper and lower airway with some increased dosages of both her Lotemax and doxycycline and Dymista as noted above.  As well, we have given her literature about immunotherapy during today's visit which would definitely  be an option given the fact that she appears to be failing medical treatment.  We will see how things go over the course of the next 4 weeks.  Laurette Schimke, MD Allergy / Immunology [facility]

## 2018-07-25 ENCOUNTER — Encounter: Payer: Self-pay | Admitting: Allergy and Immunology

## 2018-08-21 ENCOUNTER — Ambulatory Visit: Payer: Self-pay | Admitting: Allergy and Immunology

## 2018-08-26 ENCOUNTER — Other Ambulatory Visit: Payer: Self-pay | Admitting: Cardiovascular Disease

## 2018-10-08 ENCOUNTER — Other Ambulatory Visit: Payer: Self-pay | Admitting: Family Medicine

## 2018-10-08 DIAGNOSIS — N644 Mastodynia: Secondary | ICD-10-CM

## 2018-10-11 ENCOUNTER — Ambulatory Visit
Admission: RE | Admit: 2018-10-11 | Discharge: 2018-10-11 | Disposition: A | Discharge status: Home / Self Care | Payer: BC Managed Care – PPO | Source: Ambulatory Visit | Attending: Family Medicine | Admitting: Family Medicine

## 2018-10-11 ENCOUNTER — Other Ambulatory Visit: Payer: Self-pay

## 2018-10-11 DIAGNOSIS — N644 Mastodynia: Secondary | ICD-10-CM

## 2018-12-07 ENCOUNTER — Other Ambulatory Visit: Payer: Self-pay | Admitting: *Deleted

## 2018-12-07 DIAGNOSIS — Z20822 Contact with and (suspected) exposure to covid-19: Secondary | ICD-10-CM

## 2018-12-09 LAB — NOVEL CORONAVIRUS, NAA: SARS-CoV-2, NAA: NOT DETECTED

## 2018-12-11 ENCOUNTER — Ambulatory Visit: Payer: BC Managed Care – PPO | Admitting: Allergy and Immunology

## 2018-12-11 ENCOUNTER — Ambulatory Visit
Admission: RE | Admit: 2018-12-11 | Discharge: 2018-12-11 | Disposition: A | Discharge status: Home / Self Care | Payer: BC Managed Care – PPO | Source: Ambulatory Visit | Attending: Allergy and Immunology | Admitting: Allergy and Immunology

## 2018-12-11 ENCOUNTER — Encounter: Payer: Self-pay | Admitting: Allergy and Immunology

## 2018-12-11 ENCOUNTER — Other Ambulatory Visit: Payer: Self-pay

## 2018-12-11 DIAGNOSIS — J4521 Mild intermittent asthma with (acute) exacerbation: Secondary | ICD-10-CM

## 2018-12-11 DIAGNOSIS — H04123 Dry eye syndrome of bilateral lacrimal glands: Secondary | ICD-10-CM

## 2018-12-11 DIAGNOSIS — J3089 Other allergic rhinitis: Secondary | ICD-10-CM

## 2018-12-11 DIAGNOSIS — H1045 Other chronic allergic conjunctivitis: Secondary | ICD-10-CM

## 2018-12-11 DIAGNOSIS — H1013 Acute atopic conjunctivitis, bilateral: Secondary | ICD-10-CM

## 2018-12-11 DIAGNOSIS — T7800XD Anaphylactic reaction due to unspecified food, subsequent encounter: Secondary | ICD-10-CM

## 2018-12-11 NOTE — Progress Notes (Signed)
[place]   Follow-up Note  Referring Provider: Tally JoeSwayne, David, MD  Primary Provider: Tally JoeSwayne, David, MD Date of Office Visit: 12/11/2018  Subjective:   Bianca [name] (DOB: 06/13/1955) is a 63 y.o. female who [facility] in re-evaluation of the following:  HPI: Bianca [name] returns to this clinic in reevaluation of allergic conjunctivitis, dry eye syndrome, possible ocular rosacea, history of allergic rhinitis, and shellfish allergy.  I last saw her in this clinic on 24 July 2018.  When I saw her last she was utilizing a topical steroid eyedrop and olopatadine eyedrop and doxycycline for her eye issue and everything improved and she stopped all medicines and she has done well for the past several months with just a wetting solution regarding her eye issue.  She has a very distant history of asthma and allergic rhinitis which has been under excellent control without the use of much medication except occasional nasal steroid.  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  She feels very weak in general and just feels bad without any chills or fever or sputum production or ugly nasal discharge or anosmia or significant headache.  She apparently was given prednisone which she is using at a dose of 20 mg a day and she is on day 4 of this medication.  She contacted her primary care doctor yesterday and apparently was prescribed an antibiotic which she will not use.  She did have an old albuterol inhaler that she started to use and indeed this did appear to help her coughing.  Her COVID-19 test was negative with this episode.  She remains away from consuming shellfish.  Allergies as of  12/11/2018      Reactions   Codeine Other (See Comments)   Other reaction(s): Other (See Comments) Pounding in head  Pounding in head    Amoxicillin    Diarrhea and vomiting.  Has patient had a PCN reaction causing immediate rash, facial/tongue/throat swelling, SOB or lightheadedness with hypotension: No Has patient had a PCN reaction causing severe rash involving mucus membranes or skin necrosis: No Has patient had a PCN reaction that required hospitalization No Has patient had a PCN reaction occurring within the last 10 years: No If all of the above answers are "NO", then may proceed with Cephalosporin use.   Augmentin [amoxicillin-pot Clavulanate]    Diarrhea and vomiting.    Bee Venom    Swelling   Latex Hives   Metoprolol Other (See Comments)   Hair loss   Milk-related Compounds    Diarrhea & gas   Shellfish Allergy    HIVES, SWELLING, N/V & DIARREHA   Sulfa Antibiotics    Sulfur       Medication List    acetaminophen 325 MG tablet Commonly known as: TYLENOL Take 500 mg by mouth every 6 (six) hours as needed for mild pain.   amLODipine 2.5 MG tablet Commonly known as: NORVASC Take 1 tablet (2.5 mg total) by mouth 2 (two) times daily.   Auvi-Q 0.3 mg/0.3 mL Soaj injection Generic drug: EPINEPHrine Inject 0.3 mLs (0.3 mg total) into the muscle as needed for anaphylaxis.   FLUoxetine 10 MG tablet Commonly known as:  PROZAC Take 10 mg by mouth daily.   fluticasone 50 MCG/ACT nasal spray Commonly known as: FLONASE fluticasone propionate 50 mcg/actuation nasal spray,suspension   furosemide 20 MG tablet Commonly known as: LASIX TAKE 1 TABLET BY MOUTH DAILY AS NEEDED FOR SWELLING   guaiFENesin 600 MG 12 hr tablet Commonly known as: MUCINEX Take by mouth 2 (two) times daily.   LORazepam 1 MG tablet Commonly known as: ATIVAN Take 0.5 mg by mouth daily as needed. Take 1/2 to 1 maximum twice daily for anxiety only as needed   MAGNESIUM CITRATE PO Take by mouth.    multivitamin capsule Take 1 capsule by mouth daily.   predniSONE 20 MG tablet Commonly known as: DELTASONE TK 2 TS PO QD FOR 4 DAYS   psyllium 58.6 % powder Commonly known as: METAMUCIL Take 1 packet by mouth 3 (three) times daily.       Past Medical History:  Diagnosis Date  . Allergy   . Anemia   . Anxiety   . Asthma   . H/O exercise stress test 2008; October 2014   was normal in 2008; positive for ischemia with inferior and lateral ST depressions October 2014  . Hx of echocardiogram 091/2012   relatively normal as well. No significant valvar lesions. Normal Ef.  . Hypertension   . Migraines   . Palpitations     Past Surgical History:  Procedure Laterality Date  . ABDOMINAL EXPLORATION SURGERY  1980  . CARDIAC CATHETERIZATION  04/2010   with no evidence of ischemia or significant coronary disease to speak of, normal LV function with relatively normal EDP.  Marland Kitchen. LEFT HEART CATHETERIZATION WITH CORONARY ANGIOGRAM N/A 03/18/2013   Procedure: LEFT HEART CATHETERIZATION WITH CORONARY ANGIOGRAM;  Surgeon: Kathleene Hazelhristopher D McAlhany, MD;  [facility];  Service: Cardiovascular;  Laterality: N/A;  . NASAL SINUS SURGERY      Review of systems negative except as noted in HPI / PMHx or noted below:  Review of Systems  Constitutional: Negative.   HENT: Negative.   Eyes: Negative.   Respiratory: Negative.   Cardiovascular: Negative.   Gastrointestinal: Negative.   Genitourinary: Negative.   Musculoskeletal: Negative.   Skin: Negative.   Neurological: Negative.   Endo/Heme/Allergies: Negative.   Psychiatric/Behavioral: Negative.      Objective:   Vitals:   12/11/18 1117  BP: 118/72  Pulse: 68  Resp: 16  Temp: 98 F (36.7 C)  SpO2: 100%          Physical Exam Constitutional:      Appearance: She is not diaphoretic.  HENT:     Head: Normocephalic.     Right Ear: Tympanic membrane, ear canal and external ear normal.     Left Ear: Tympanic membrane, ear  canal and external ear normal.     Nose: Nose normal. No mucosal edema or rhinorrhea.     Mouth/Throat:     Pharynx: Uvula midline. No oropharyngeal exudate.  Eyes:     Conjunctiva/sclera: Conjunctivae normal.  Neck:     Thyroid: No thyromegaly.     Trachea: Trachea normal. No tracheal tenderness or tracheal deviation.  Cardiovascular:     Rate and Rhythm: Normal rate and regular rhythm.     Heart sounds: Normal heart sounds, S1 normal and S2 normal. No murmur.  Pulmonary:     Effort: No respiratory distress.     Breath sounds: Normal breath sounds. No stridor. No wheezing or rales.  Lymphadenopathy:     Head:  Right side of head: No tonsillar adenopathy.     Left side of head: No tonsillar adenopathy.     Cervical: No cervical adenopathy.  Skin:    Findings: No erythema or rash.     Nails: There is no clubbing.   Neurological:     Mental Status: She is alert.     Diagnostics: none  Assessment and Plan:   1. Asthma, not well controlled, mild intermittent, with acute exacerbation   2. Perennial allergic rhinitis   3. Perennial allergic conjunctivitis of both eyes   4. 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  Allergen avoidance measures.  Avoid all oral antihistamines  2.  Use Systane gel drops at night before bedtime. Use Systane drop multiple times per day if needed  3. Auvi-Q 0.3 / Epi-pen if needed  4. For this recent episode use the following:   A.  Symbicort 160 -2 inhalations twice a day (sample)  B.  Pro-air Digihaler -2 inhalations every 4-6 hours if needed (sample)  C.  Nasal saline several times a day  D.  OTC Mucinex DM-1-2 tablets 1-2 times a day  E.  OTC ibuprofen  5. If needed:   A. Can restart flonase -1-2 sprays each nostril 1 time per day  B. Generic Pataday - 1 drop each eye 1 time per day  6.  Obtain a chest x-ray. Further treatment?  7.  Return to clinic in 6 months or earlier if  problem   8.  Obtain fall flu vaccine (and COVID vaccine)  Bianca [name] obviously has an episode of inflammation and I think it would be worthwhile to obtain a chest x-ray given the fact that she has both back pain and sternal pain in association with this event.  Difficult to say exactly what causes inflammation.  It may have been allergen exposure when she was cleaning out her storage unit or it may be that she is infected with a virus.  I will have her utilize the plan of action noted above while we await the results of her chest x-ray.  I will contact her once these the results are available for review and will make a determination about further evaluation and treatment if required.  Allena Katz, MD Allergy / Immunology [place]

## 2018-12-11 NOTE — Patient Instructions (Addendum)
  1.  Allergen avoidance measures.  Avoid all oral antihistamines  2.  Use Systane gel drops at night before bedtime. Use Systane drop multiple times per day if needed  3. Auvi-Q 0.3 / Epi-pen if needed  4. For this recent episode use the following:   A.  Symbicort 160 -2 inhalations twice a day (sample)  B.  Pro-air Digihaler -2 inhalations every 4-6 hours if needed (sample)  C.  Nasal saline several times a day  D.  OTC Mucinex DM-1-2 tablets 1-2 times a day  E.  OTC ibuprofen  5. If needed:   A. Can restart flonase -1-2 sprays each nostril 1 time per day  B. Generic Pataday - 1 drop each eye 1 time per day  6.  Obtain a chest x-ray. Further treatment?  7.  Return to clinic in 6 months or earlier if problem   8.  Obtain fall flu vaccine (and COVID vaccine)

## 2018-12-12 ENCOUNTER — Encounter: Payer: Self-pay | Admitting: Allergy and Immunology

## 2018-12-27 ENCOUNTER — Telehealth: Payer: Self-pay | Admitting: Allergy and Immunology

## 2018-12-27 ENCOUNTER — Other Ambulatory Visit: Payer: Self-pay | Admitting: *Deleted

## 2018-12-27 MED ORDER — IPRATROPIUM-ALBUTEROL 0.5-2.5 (3) MG/3ML IN SOLN: 3.0000 mL | RESPIRATORY_TRACT | 1 refills | Status: DC | PRN

## 2018-12-27 NOTE — Telephone Encounter (Signed)
Pt called and needs to get a neb machine [phone].

## 2018-12-27 NOTE — Telephone Encounter (Signed)
Called and spoke with patient and she stated that she is still having issues with mucous, drainage, and congestion at night. She states that she used to use a nebulizer at night and it would seem to help with these symptoms. Patient states that she is still using her ProAir and Symbicort. Patient does not currently have any prescriptions for a nebulizer machine. Please advise.

## 2018-12-27 NOTE — Telephone Encounter (Signed)
Prescription has been sent in to requested pharmacy and nebulizer has been placed up front. Called and informed patient and she verbalized understanding. Patient has been schedule to follow up with Dr. Ernst Bowler on 01/01/19 regarding the issues she is having.

## 2018-12-27 NOTE — Telephone Encounter (Signed)
Please have patient obtain a nebulizer utilizing DuoNeb nebulized every 4-6 hours if needed.  Because she continues to have problems in the face of the therapy prescribed on 11 December 2018 she will need to come back in the next week or so for further evaluation.  We were under the impression that this issue would resolve within a month but it does not appear to be doing so.

## 2019-01-01 ENCOUNTER — Ambulatory Visit: Payer: Self-pay | Admitting: Allergy & Immunology

## 2019-01-03 ENCOUNTER — Ambulatory Visit: Payer: Self-pay | Admitting: Allergy & Immunology

## 2019-01-08 ENCOUNTER — Ambulatory Visit: Payer: Self-pay | Admitting: Allergy and Immunology

## 2019-01-10 ENCOUNTER — Encounter: Payer: Self-pay | Admitting: Allergy & Immunology

## 2019-01-10 ENCOUNTER — Ambulatory Visit: Payer: BC Managed Care – PPO | Admitting: Allergy & Immunology

## 2019-01-10 ENCOUNTER — Other Ambulatory Visit: Payer: Self-pay

## 2019-01-10 VITALS — BP 126/80 | HR 66 | Temp 97.8°F | Ht 61.0 in

## 2019-01-10 DIAGNOSIS — J3089 Other allergic rhinitis: Secondary | ICD-10-CM | POA: Diagnosis not present

## 2019-01-10 DIAGNOSIS — J4530 Mild persistent asthma, uncomplicated: Secondary | ICD-10-CM | POA: Diagnosis not present

## 2019-01-10 MED ORDER — BUDESONIDE-FORMOTEROL FUMARATE 160-4.5 MCG/ACT IN AERO: 2.0000 | INHALATION_SPRAY | Freq: Two times a day (BID) | RESPIRATORY_TRACT | 1 refills | Status: DC

## 2019-01-10 NOTE — Progress Notes (Signed)
FOLLOW UP  Date of Service/Encounter:  01/10/19   Assessment:   Mild persistent asthma, uncomplicated  Perennial allergic rhinitis (dust mites)   Ms. Bianca [name] presents for a sick visit. In the interim since her last visit, she called and requested a nebulizer. This step made [place] worried that her clinical condition was deteriorating, but today she seems very well without any increased coughing and postnasal drip. She has remained afebrile and she had a negative COVID19 test. She has not been using her controller medication regularly at all, but I think this would provide relief since she is using her albuterol around four times per day. She is open to this idea and we compromised by recommending only 1 puff of the Symbicort twice daily. She does need a prescription for this today.   Plan/Recommendations:   1. Mild persistent asthma, uncomplicated - Lung testing looked excellent today. - I think it would be good to use the Symbicort every day. - Using the Symbicort every day will help decrease the need for ProAir and allow it to work better when you actually need it.  - Spacer sample and demonstration provided. - Daily controller medication(s): Symbicort 160/4.77mg ONE PUFF twice daily with spacer - Prior to physical activity: ProAir 2 puffs 10-15 minutes before physical activity. - Rescue medications: DuoNeb nebulizer one vial every 4-6 hours as needed - Asthma control goals:  * Full participation in all desired activities (may need albuterol before activity) * Albuterol use two time or less a week on average (not counting use with activity) * Cough interfering with sleep two time or less a month * Oral steroids no more than once a year * No hospitalizations  2. Perennial allergic rhinitis - Continue with fluticasone one spray per nostril up to twice daily. - We will hold off on antihistamines for now.  3. Return in about 6 weeks (around 02/21/2019). This can be an in-person, a  virtual Webex or a telephone follow up visit.    Subjective:   Bianca BRADBYis a 63y.o. female presenting today for follow up of  Chief Complaint  Patient presents with  . Cough    phlem, antihistamines cause dry eyes, and takes nasal sprays   . Sinus Problem    MKOHANA AMBLEhas a history of the following: Patient Active Problem List   Diagnosis Date Noted  . Chest pain 03/13/2013  . Abnormal stress ECG with treadmill 02/24/2013  . HTN (hypertension) 12/24/2011  . Heart palpitations 12/24/2011  . Awareness of heartbeats 11/30/2011    History obtained from: chart review and patient.  Bianca [name] a 63y.o. female presenting for a sick visit.  She was last seen in August 2020 by Dr. KNeldon Mc  This is the first time that I met her in the clinic.  She has a history of asthma which is not well controlled.  In August, she was treated for an acute exacerbation.  She was started on Symbicort 2 puffs twice daily as well as a PAir cabin crew.  It was encouraged that she restart her Flonase 1 to 2 sprays per nostril and Pataday 1 drop per eye 1 time daily.  He did have a chest x-ray that showed stable cardiomegaly without any acute infiltrate.  She was tested for the coronavirus at that time and it was negative as well.  In the interim, she was prescribed a nebulizer machine and DuoNeb solution.  She has had slow improvement over the course of the  past month or so. She is using the ProAir 3-4 times daily over the last few days. Sheis using the nebulizer once per day. It should be noted that she has NOT been using the Symbicort on a regular basis.   She has been having symptoms at this point for about two months, even before she made the appointment with Dr. Neldon Mc. Since that visit, she has had a lot of mucous production in her throat which has resulted in a lot of postnasal drip. She has been using the cough drops which have been helped. She has not had a fever at all.   She is on the  fluticasone up to twice daily if she is having the drainage. The last couple of nights, she has been having a lot of mucous.   Otherwise, there have been no changes to her past medical history, surgical history, family history, or social history.    Review of Systems  Constitutional: Negative.  Negative for chills, fever, malaise/fatigue and weight loss.  HENT: Negative.  Negative for congestion, ear discharge, ear pain, sinus pain and sore throat.   Eyes: Negative for pain, discharge and redness.  Respiratory: Negative for cough, sputum production, shortness of breath and wheezing.   Cardiovascular: Negative.  Negative for chest pain and palpitations.  Gastrointestinal: Negative for abdominal pain, constipation, diarrhea, heartburn, nausea and vomiting.  Skin: Negative.  Negative for itching and rash.  Neurological: Negative for dizziness and headaches.  Endo/Heme/Allergies: Negative for environmental allergies. Does not bruise/bleed easily.       Objective:   Blood pressure 126/80, pulse 66, temperature 97.8 F (36.6 C), temperature source Temporal, height '5\' 1"'$  (1.549 m), last menstrual period 11/16/2009, SpO2 100 %. Body mass index is 30.61 kg/m.   Physical Exam:  Physical Exam  Constitutional: She appears well-developed.  Very pleasant female.   HENT:  Head: Normocephalic and atraumatic.  Right Ear: Tympanic membrane, external ear and ear canal normal.  Left Ear: Tympanic membrane, external ear and ear canal normal.  Nose: Mucosal edema present. No rhinorrhea, nasal deformity or septal deviation. No epistaxis. Right sinus exhibits no maxillary sinus tenderness and no frontal sinus tenderness. Left sinus exhibits no maxillary sinus tenderness and no frontal sinus tenderness.  Mouth/Throat: Uvula is midline and oropharynx is clear and moist. Mucous membranes are not pale and not dry.  Turbinates are erythematous bilaterally.  Eyes: Pupils are equal, round, and reactive to  light. Conjunctivae and EOM are normal. Right eye exhibits no chemosis and no discharge. Left eye exhibits no chemosis and no discharge. Right conjunctiva is not injected. Left conjunctiva is not injected.  Cardiovascular: Normal rate, regular rhythm and normal heart sounds.  Respiratory: Effort normal and breath sounds normal. No accessory muscle usage. No tachypnea. No respiratory distress. She has no wheezes. She has no rhonchi. She has no rales. She exhibits no tenderness.  No wheezing or crackles noted.   Lymphadenopathy:    She has no cervical adenopathy.  Neurological: She is alert.  Skin: No abrasion, no petechiae and no rash noted. Rash is not papular, not vesicular and not urticarial. No erythema. No pallor.  No eczematous or urticarial lesions noted.  Psychiatric: She has a normal mood and affect.     Diagnostic studies:    Spirometry: results normal (FEV1: 1.57/88%, FVC: 1.92/84%, FEV1/FVC: 82%).    Spirometry consistent with normal pattern.   Allergy Studies: none        Salvatore Marvel, MD  [facility] [place]

## 2019-01-10 NOTE — Patient Instructions (Addendum)
1. Mild persistent asthma, uncomplicated - Lung testing looked excellent today. - I think it would be good to use the Symbicort every day. - Using the Symbicort every day will help decrease the need for ProAir and allow it to work better when you actually need it.  - Spacer sample and demonstration provided. - Daily controller medication(s): Symbicort 160/4.57mcg ONE PUFF twice daily with spacer - Prior to physical activity: ProAir 2 puffs 10-15 minutes before physical activity. - Rescue medications: DuoNeb nebulizer one vial every 4-6 hours as needed - Asthma control goals:  * Full participation in all desired activities (may need albuterol before activity) * Albuterol use two time or less a week on average (not counting use with activity) * Cough interfering with sleep two time or less a month * Oral steroids no more than once a year * No hospitalizations  2. Perennial allergic rhinitis - Continue with fluticasone one spray per nostril up to twice daily. - We will hold off on antihistamines for now.  3. Return in about 6 weeks (around 02/21/2019). This can be an in-person, a virtual Webex or a telephone follow up visit.   Please inform us of any Emergency Department visits, hospitalizations, or changes in symptoms. Call us before going to the ED for breathing or allergy symptoms since we might be able to fit you in for a sick visit. Feel free to contact us anytime with any questions, problems, or concerns.  It was a pleasure to meet you today!  Websites that have reliable patient information: 1. American Academy of Asthma, Allergy, and Immunology: www.aaaai.org 2. Food Allergy Research and Education (FARE): foodallergy.org 3. Mothers of Asthmatics: http://www.asthmacommunitynetwork.org 4. American College of Allergy, Asthma, and Immunology: www.acaai.org  "Like" [place] on Facebook and Instagram for our latest updates!      Make sure you are registered to vote! If you have moved or  changed any of your contact information, you will need to get this updated before voting!  In some cases, you MAY be able to register to vote online: [email or web address]    Voter ID laws are NOT going into effect for the General Election in November 2020! DO NOT let this stop you from exercising your right to vote!   Absentee voting is the SAFEST way to vote during the coronavirus pandemic!   Download and print an absentee ballot request form at [email or web address] or you can scan the QR code below with your smart phone:      More information on absentee ballots can be found here: [email or web address]     Allergy Shots   Allergies are the result of a chain reaction that starts in the immune system. Your immune system controls how your body defends itself. For instance, if you have an allergy to pollen, your immune system identifies pollen as an invader or allergen. Your immune system overreacts by producing antibodies called Immunoglobulin E (IgE). These antibodies travel to cells that release chemicals, causing an allergic reaction.  The concept behind allergy immunotherapy, whether it is received in the form of shots or tablets, is that the immune system can be desensitized to specific allergens that trigger allergy symptoms. Although it requires time and patience, the payback can be long-term relief.  How Do Allergy Shots Work?  Allergy shots work much like a vaccine. Your body responds to injected amounts of a particular allergen given in increasing doses, eventually developing a resistance and tolerance to it. Allergy shots can lead to decreased, minimal or no allergy  symptoms.  There generally are two phases: build-up and maintenance. Build-up often ranges from three to six months and involves receiving injections with increasing amounts of the allergens. The shots are typically given once or twice a week, though more rapid build-up  schedules are sometimes used.  The maintenance phase begins when the most effective dose is reached. This dose is different for each person, depending on how allergic you are and your response to the build-up injections. Once the maintenance dose is reached, there are longer periods between injections, typically two to four weeks.  Occasionally doctors give cortisone-type shots that can temporarily reduce allergy symptoms. These types of shots are different and should not be confused with allergy immunotherapy shots.  Who Can Be Treated with Allergy Shots?  Allergy shots may be a good treatment approach for people with allergic rhinitis (hay fever), allergic asthma, conjunctivitis (eye allergy) or stinging insect allergy.   Before deciding to begin allergy shots, you should consider:  . The length of allergy season and the severity of your symptoms . Whether medications and/or changes to your environment can control your symptoms . Your desire to avoid long-term medication use . Time: allergy immunotherapy requires a major time commitment . Cost: may vary depending on your insurance coverage  Allergy shots for children age 48five and older are effective and often well tolerated. They might prevent the onset of new allergen sensitivities or the progression to asthma.  Allergy shots are not started on patients who are pregnant but can be continued on patients who become pregnant while receiving them. In some patients with other medical conditions or who take certain common medications, allergy shots may be of risk. It is important to mention other medications you talk to your allergist.   When Will I Feel Better?  Some may experience decreased allergy symptoms during the build-up phase. For others, it may take as long as 12 months on the maintenance dose. If there is no improvement after a year of maintenance, your allergist will discuss other treatment options with you.  If you aren't  responding to allergy shots, it may be because there is not enough dose of the allergen in your vaccine or there are missing allergens that were not identified during your allergy testing. Other reasons could be that there are high levels of the allergen in your environment or major exposure to non-allergic triggers like tobacco smoke.  What Is the Length of Treatment?  Once the maintenance dose is reached, allergy shots are generally continued for three to five years. The decision to stop should be discussed with your allergist at that time. Some people may experience a permanent reduction of allergy symptoms. Others may relapse and a longer course of allergy shots can be considered.  What Are the Possible Reactions?  The two types of adverse reactions that can occur with allergy shots are local and systemic. Common local reactions include very mild redness and swelling at the injection site, which can happen immediately or several hours after. A systemic reaction, which is less common, affects the entire body or a particular body system. They are usually mild and typically respond quickly to medications. Signs include increased allergy symptoms such as sneezing, a stuffy nose or hives.  Rarely, a serious systemic reaction called anaphylaxis can develop. Symptoms include swelling in the throat, wheezing, a feeling of tightness in the chest, nausea or dizziness. Most serious systemic reactions develop within 30 minutes of allergy shots. This is why it is strongly  recommended you wait in your doctor's office for 30 minutes after your injections. Your allergist is trained to watch for reactions, and his or her staff is trained and equipped with the proper medications to identify and treat them.  Who Should Administer Allergy Shots?  The preferred location for receiving shots is your prescribing allergist's office. Injections can sometimes be given at another facility where the physician and staff are  trained to recognize and treat reactions, and have received instructions by your prescribing allergist.

## 2019-01-11 ENCOUNTER — Other Ambulatory Visit: Payer: Self-pay | Admitting: *Deleted

## 2019-01-11 ENCOUNTER — Encounter: Payer: Self-pay | Admitting: Allergy & Immunology

## 2019-01-11 MED ORDER — BUDESONIDE-FORMOTEROL FUMARATE 160-4.5 MCG/ACT IN AERO: 2.0000 | INHALATION_SPRAY | Freq: Two times a day (BID) | RESPIRATORY_TRACT | 1 refills | Status: DC

## 2019-01-14 ENCOUNTER — Other Ambulatory Visit: Payer: Self-pay

## 2019-01-14 MED ORDER — SYMBICORT 160-4.5 MCG/ACT IN AERO: 2.0000 | INHALATION_SPRAY | Freq: Two times a day (BID) | RESPIRATORY_TRACT | 5 refills | Status: DC

## 2019-01-14 NOTE — Telephone Encounter (Signed)
Request for brand name product received from pharmacy. I have sent that in.

## 2019-02-05 NOTE — Progress Notes (Signed)
Cardiology Office Note:    Date:  02/06/2019   ID:  Bianca [name], DOB 02/03/1956, MRN [id number]  PCP:  Bianca JoeSwayne, David, MD  Cardiologist:  Bianca Carrowhristopher McAlhany, MD  Electrophysiologist:  None   Referring MD: Bianca JoeSwayne, David, MD   Chief Complaint  Patient presents with  . Follow-up    coronary vasospasm    History of Present Illness:    Bianca [name] is a 63 y.o. female with:   Chest pain 2/2 to possible vasospasm  Prior ETT with + ECG changes  Cath in 2014: no CAD  Asthma   Hypertension   Migraine HAs   Ms. Bianca [name] was last seen by Bianca [name] in 01/2018.  Amlodipine was DC'd at that time due to hair loss.   She returns for follow-up.  She is here alone.  She has been doing well without chest discomfort or significant shortness of breath.  She has not had dizziness or syncope.  She has not had significant leg swelling.  She is concerned that amlodipine is causing dry skin and dry mouth.    Prior CV studies:   The following studies were reviewed today:  LHC (03/2013):  No CAD, EF 65%  Echo (04/2010):  EF 55%  Carotid US (4/14):  Normal   Past Medical History:  Diagnosis Date  . Allergy   . Anemia   . Anxiety   . Asthma   . H/O exercise stress test 2008; October 2014   was normal in 2008; positive for ischemia with inferior and lateral ST depressions October 2014  . Hx of echocardiogram 091/2012   relatively normal as well. No significant valvar lesions. Normal Ef.  . Hypertension   . Migraines   . Palpitations    Surgical Hx: The patient  has a past surgical history that includes Cardiac catheterization (04/2010); Abdominal exploration surgery (1980); Nasal sinus surgery; and left heart catheterization with coronary angiogram (N/A, 03/18/2013).   Current Medications: Current Meds  Medication Sig  . acetaminophen (TYLENOL) 325 MG tablet Take 500 mg by mouth every 6 (six) hours as needed for mild pain.   Marland Kitchen. albuterol (VENTOLIN HFA) 108 (90 Base)  MCG/ACT inhaler Inhale 1-2 puffs into the lungs every 6 (six) hours as needed for wheezing or shortness of breath.  Marland Kitchen. AUVI-Q 0.3 MG/0.3ML SOAJ injection Inject 0.3 mLs (0.3 mg total) into the muscle as needed for anaphylaxis.  Marland Kitchen. FLUoxetine (PROZAC) 10 MG tablet Take 10 mg by mouth daily.   . fluticasone (FLONASE) 50 MCG/ACT nasal spray fluticasone propionate 50 mcg/actuation nasal spray,suspension  . furosemide (LASIX) 20 MG tablet TAKE 1 TABLET BY MOUTH DAILY AS NEEDED FOR SWELLING  . guaiFENesin (MUCINEX) 600 MG 12 hr tablet Take by mouth as needed.   Marland Kitchen. ipratropium-albuterol (DUONEB) 0.5-2.5 (3) MG/3ML SOLN Take 3 mLs by nebulization every 4 (four) hours as needed.  Marland Kitchen. LORazepam (ATIVAN) 1 MG tablet Take 0.5 mg by mouth daily as needed. Take 1/2 to 1 maximum twice daily for anxiety only as needed  . Multiple Vitamin (MULTIVITAMIN) capsule Take 1 capsule by mouth daily.   . psyllium (METAMUCIL) 58.6 % powder Take 1 packet by mouth daily.   . SYMBICORT 160-4.5 MCG/ACT inhaler Inhale 2 puffs into the lungs 2 (two) times daily.  . [DISCONTINUED] amLODipine (NORVASC) 2.5 MG tablet Take 1 tablet (2.5 mg total) by mouth 2 (two) times daily.     Allergies:   Codeine, Amoxicillin, Augmentin [amoxicillin-pot clavulanate], Bee venom, Latex, Metoprolol, Milk-related compounds, Shellfish  allergy, Sulfa antibiotics, and Sulfur   Social History   Tobacco Use  . Smoking status: Never Smoker  . Smokeless tobacco: Never Used  Substance Use Topics  . Alcohol use: No    Alcohol/week: 0.0 standard drinks  . Drug use: No     Family Hx: The patient's family history includes Anemia in her mother; Asthma in her mother; Atrial fibrillation in her brother; Cancer in her maternal grandfather and paternal grandfather; Diabetes in her maternal grandmother, paternal grandfather, and paternal grandmother; Heart attack in her paternal grandfather; Hypertension in her brother, father, mother, sister, and another family  member; Stroke in her brother, father, and maternal grandmother.  ROS:   Please see the history of present illness.    Review of Systems  Gastrointestinal: Negative for hematochezia.  Genitourinary: Negative for hematuria.   All other systems reviewed and are negative.   EKGs/Labs/Other Test Reviewed:    EKG:  EKG is  ordered today.  The ekg ordered today demonstrates normal sinus rhythm, heart rate 63, normal axis, no ST-T wave changes, QTC 448, no change from prior tracing  Recent Labs: No results found for requested labs within last 8760 hours.   Recent Lipid Panel No results found for: CHOL, TRIG, HDL, CHOLHDL, LDLCALC, LDLDIRECT     Physical Exam:    VS:  BP 112/62   Pulse 63   Ht 5\' 1"  (1.549 m)   Wt 164 lb 12.8 oz (74.8 kg)   LMP 11/16/2009   SpO2 99%   BMI 31.14 kg/m     Wt Readings from Last 3 Encounters:  02/06/19 164 lb 12.8 oz (74.8 kg)  07/24/18 162 lb (73.5 kg)  07/03/18 161 lb (73 kg)     Physical Exam  Constitutional: She is oriented to person, place, and time. She appears well-developed and well-nourished. No distress.  HENT:  Head: Normocephalic and atraumatic.  Neck: Neck supple. No JVD present.  Cardiovascular: Normal rate, regular rhythm, S1 normal, S2 normal and normal heart sounds.  No murmur heard. Pulmonary/Chest: Effort normal. She has no rales.  Abdominal: Soft. There is no hepatomegaly.  Musculoskeletal:        General: No edema.  Neurological: She is alert and oriented to person, place, and time.  Skin: Skin is warm and dry.    ASSESSMENT & PLAN:    1. Coronary vasospasm (HCC) Overall controlled on amlodipine.  However, she is experiencing side effects of dry skin and dry mouth.  TSH in July was normal.  She is concerned that amlodipine is causing the symptoms.  We discussed alternative therapies including diltiazem versus isosorbide.  As diltiazem is also a calcium channel blocker, I suspect she would have the same side effects.   She is would like to try isosorbide.  -Start isosorbide 15 mg daily x2 to 3 days, then increase to 30 mg daily  -If she cannot tolerate isosorbide secondary to headache, she will resume amlodipine  -We can consider Diltiazem CD 120 mg once daily if needed   -Follow-up 1 year  2. Essential hypertension The patient's blood pressure is controlled on her current regimen.  Continue current therapy.     Dispo:  Return in about 1 year (around 02/06/2020) for Routine Follow Up w/ Dr. Angelena Form, or Richardson Dopp, PA-C, in person.   Medication Adjustments/Labs and Tests Ordered: Current medicines are reviewed at length with the patient today.  Concerns regarding medicines are outlined above.  Tests Ordered: Orders Placed This Encounter  Procedures  .  EKG 12-Lead   Medication Changes: Meds ordered this encounter  Medications  . isosorbide mononitrate (IMDUR) 30 MG 24 hr tablet    Sig: Take 1 tablet (30 mg total) by mouth daily.    Dispense:  30 tablet    Refill:  [address], [place], [place]  02/06/2019 5:39 PM    [facility] [address] [place], [place], [place]  [address] Phone: [phone]; Fax: [phone]

## 2019-02-06 ENCOUNTER — Ambulatory Visit: Payer: BC Managed Care – PPO | Admitting: Physician Assistant

## 2019-02-06 ENCOUNTER — Encounter: Payer: Self-pay | Admitting: Physician Assistant

## 2019-02-06 ENCOUNTER — Other Ambulatory Visit: Payer: Self-pay

## 2019-02-06 VITALS — BP 112/62 | HR 63 | Ht 61.0 in | Wt 164.8 lb

## 2019-02-06 DIAGNOSIS — I201 Angina pectoris with documented spasm: Secondary | ICD-10-CM

## 2019-02-06 DIAGNOSIS — I10 Essential (primary) hypertension: Secondary | ICD-10-CM

## 2019-02-06 MED ORDER — ISOSORBIDE MONONITRATE ER 30 MG PO TB24: 30.0000 mg | ORAL_TABLET | Freq: Every day | ORAL | 9 refills | Status: DC

## 2019-02-06 NOTE — Patient Instructions (Signed)
Medication Instructions:  Your physician has recommended you make the following change in your medication: 1. Discontinue Amlodipine 2. Start Imdur one half tablet (15mg  ) every day for 2-3 days than increase to one tablet (30 mg ) daily. Sent in to requested pharmacy.  This can cause a headache.    *If you need a refill on your cardiac medications before your next appointment, please call your pharmacy*  Lab Work: -None If you have labs (blood work) drawn today and your tests are completely normal, you will receive your results only by: Marland Kitchen MyChart Message (if you have MyChart) OR . A paper copy in the mail If you have any lab test that is abnormal or we need to change your treatment, we will call you to review the results.  Testing/Procedures: -None  Follow-Up: At [facility], you and your health needs are our priority.  As part of our continuing mission to provide you with exceptional heart care, we have created designated Provider Care Teams.  These Care Teams include your primary Cardiologist (physician) and Advanced Practice Providers (APPs -  Physician Assistants and Nurse Practitioners) who all work together to provide you with the care you need, when you need it.  Your next appointment:   12 months  The format for your next appointment:   In Person  Provider:   Lauree Chandler, MD  Other Instructions -None

## 2019-02-08 ENCOUNTER — Telehealth: Payer: Self-pay | Admitting: Cardiovascular Disease

## 2019-02-08 NOTE — Telephone Encounter (Signed)
  Richardson Dopp changed the patient from Amlodipine to Imdur at last visit on 02/06/19. Patient is calling today because she would like to stay on the amolodipine instead. Please advise.

## 2019-02-08 NOTE — Telephone Encounter (Signed)
LMTCB

## 2019-02-11 MED ORDER — AMLODIPINE BESYLATE 2.5 MG PO TABS: 2.5000 mg | ORAL_TABLET | Freq: Two times a day (BID) | ORAL | 3 refills | Status: DC

## 2019-02-11 NOTE — Telephone Encounter (Signed)
I spoke to the patient with Scott's recommendation.  She will restart Amlodipine 2.5 mg bid and stop Imdur.

## 2019-02-11 NOTE — Telephone Encounter (Signed)
Ok to remain on Amlodipine. Please remove Imdur from medication list. Thanks, Richardson Dopp, PA-C    02/11/2019 7:52 AM

## 2019-02-20 ENCOUNTER — Other Ambulatory Visit: Payer: Self-pay

## 2019-02-20 DIAGNOSIS — Z20822 Contact with and (suspected) exposure to covid-19: Secondary | ICD-10-CM

## 2019-02-21 LAB — NOVEL CORONAVIRUS, NAA: SARS-CoV-2, NAA: NOT DETECTED

## 2019-02-22 ENCOUNTER — Telehealth: Payer: Self-pay | Admitting: Family Medicine

## 2019-02-22 NOTE — Telephone Encounter (Signed)
Negative COVID results given. Patient results "NOT Detected." Caller expressed understanding. ° °

## 2019-04-15 ENCOUNTER — Telehealth: Payer: Self-pay | Admitting: Cardiovascular Disease

## 2019-04-15 NOTE — Telephone Encounter (Signed)
Spoke with pt, she states she has had some high blood pressure readings, the highest of 150/89.  When BP is elevated she feels lightheaded.  Upon discussion she indicates she has been under increased stress.  She has anxiety medication prescribed by PCP but she is afraid to take it our of concern for addiction.  Recommended she take medication as prescribed to manage her anxiety and speak with PCP regarding concerns of dependency.  Pt has OV scheduled on 1/4 with Dr. Julianne Handler

## 2019-04-15 NOTE — Telephone Encounter (Signed)
New Message  Pt c/o BP issue: STAT if pt c/o blurred vision, one-sided weakness or slurred speech  1. What are your last 5 BP readings? 150/89, 147/90,   2. Are you having any other symptoms (ex. Dizziness, headache, blurred vision, passed out)? Lightheadedness, dizziness,   3. What is your BP issue? BP elevated.

## 2019-04-22 ENCOUNTER — Encounter: Payer: Self-pay | Admitting: Cardiovascular Disease

## 2019-04-22 ENCOUNTER — Ambulatory Visit: Payer: BC Managed Care – PPO | Admitting: Cardiovascular Disease

## 2019-04-22 ENCOUNTER — Other Ambulatory Visit: Payer: Self-pay

## 2019-04-22 VITALS — BP 128/74 | HR 77 | Ht 61.5 in | Wt 160.8 lb

## 2019-04-22 DIAGNOSIS — I10 Essential (primary) hypertension: Secondary | ICD-10-CM

## 2019-04-22 DIAGNOSIS — I201 Angina pectoris with documented spasm: Secondary | ICD-10-CM | POA: Diagnosis not present

## 2019-04-22 NOTE — Progress Notes (Signed)
r   Chief Complaint  Patient presents with  . Follow-up    coronary vasospasm    History of Present Illness: 64 yo female with history of chest pain, HTN, asthma, anxiety and suspicion for coronary vasospasm who is here today for cardiac follow up. She has had chronic chest pain with a normal cardiac cath in 2012 and again in 2014. 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 Echo January 2012 with normal LV function, no valve issues. She has been on Norvasc and has tolerated this well. I saw her in March 2019 and she c/o mild edema. I ordered an echo and started Lasix. She cancelled the echo.   She is here today for follow up. The patient denies any chest pain, dyspnea, palpitations, lower extremity edema, orthopnea, PND, dizziness, near syncope or syncope. She has had slightly blood pressures at home. She feels that her BP is up when she is stressed. She is using Xanax prn.   Primary Care Physician: Tally Joe, MD  Past Medical History:  Diagnosis Date  . Allergy   . Anemia   . Anxiety   . Asthma   . H/O exercise stress test 2008; October 2014   was normal in 2008; positive for ischemia with inferior and lateral ST depressions October 2014  . Hx of echocardiogram 091/2012   relatively normal as well. No significant valvar lesions. Normal Ef.  . Hypertension   . Migraines   . Palpitations     Past Surgical History:  Procedure Laterality Date  . ABDOMINAL EXPLORATION SURGERY  1980  . CARDIAC CATHETERIZATION  04/2010   with no evidence of ischemia or significant coronary disease to speak of, normal LV function with relatively normal EDP.  Marland Kitchen LEFT HEART CATHETERIZATION WITH CORONARY ANGIOGRAM N/A 03/18/2013   Procedure: LEFT HEART CATHETERIZATION WITH CORONARY ANGIOGRAM;  Surgeon: Kathleene Hazel, MD;  Location[facility] CATH LAB;  Service: Cardiovascular;  Laterality: N/A;  . NASAL SINUS SURGERY      Current Outpatient Medications  Medication Sig Dispense Refill  . acetaminophen  (TYLENOL) 325 MG tablet Take 500 mg by mouth every 6 (six) hours as needed for mild pain.     Marland Kitchen albuterol (VENTOLIN HFA) 108 (90 Base) MCG/ACT inhaler Inhale 1-2 puffs into the lungs every 6 (six) hours as needed for wheezing or shortness of breath.    . ALPRAZolam (XANAX) 0.5 MG tablet Take 0.5 mg by mouth as needed.    Marland Kitchen amLODipine (NORVASC) 2.5 MG tablet Take 1 tablet (2.5 mg total) by mouth 2 (two) times daily. 180 tablet 3  . AUVI-Q 0.3 MG/0.3ML SOAJ injection Inject 0.3 mLs (0.3 mg total) into the muscle as needed for anaphylaxis. 2 Device 1  . FLUoxetine (PROZAC) 10 MG tablet Take 20 mg by mouth daily.     . fluticasone (FLONASE) 50 MCG/ACT nasal spray fluticasone propionate 50 mcg/actuation nasal spray,suspension    . furosemide (LASIX) 20 MG tablet TAKE 1 TABLET BY MOUTH DAILY AS NEEDED FOR SWELLING 30 tablet 5  . guaiFENesin (MUCINEX) 600 MG 12 hr tablet Take by mouth as needed.     Marland Kitchen ipratropium-albuterol (DUONEB) 0.5-2.5 (3) MG/3ML SOLN Take 3 mLs by nebulization every 4 (four) hours as needed. 3 mL 1  . LORazepam (ATIVAN) 1 MG tablet Take 0.5 mg by mouth daily as needed. Take 1/2 to 1 maximum twice daily for anxiety only as needed    . Multiple Vitamin (MULTIVITAMIN) capsule Take 1 capsule by mouth daily.     Marland Kitchen  psyllium (METAMUCIL) 58.6 % powder Take 1 packet by mouth daily.     . SYMBICORT 160-4.5 MCG/ACT inhaler Inhale 2 puffs into the lungs 2 (two) times daily. 1 Inhaler 5   No current facility-administered medications for this visit.    Allergies  Allergen Reactions  . Codeine Other (See Comments)    Other reaction(s): Other (See Comments) Pounding in head  Pounding in head   . Amoxicillin     Diarrhea and vomiting.  Has patient had a PCN reaction causing immediate rash, facial/tongue/throat swelling, SOB or lightheadedness with hypotension: No Has patient had a PCN reaction causing severe rash involving mucus membranes or skin necrosis: No Has patient had a PCN reaction  that required hospitalization No Has patient had a PCN reaction occurring within the last 10 years: No If all of the above answers are "NO", then may proceed with Cephalosporin use.   . Augmentin [Amoxicillin-Pot Clavulanate]     Diarrhea and vomiting.   . Bee Venom     Swelling  . Latex Hives  . Metoprolol Other (See Comments)    Hair loss  . Milk-Related Compounds     Diarrhea & gas  . Shellfish Allergy     HIVES, SWELLING, N/V & DIARREHA  . Sulfa Antibiotics   . Sulfur     Social History   Socioeconomic History  . Marital status: Single    Spouse name: Not on file  . Number of children: 0  . Years of education: Not on file  . Highest education level: Not on file  Occupational History  . Occupation: Retired Chartered loss adjuster  Tobacco Use  . Smoking status: Never Smoker  . Smokeless tobacco: Never Used  Substance and Sexual Activity  . Alcohol use: No    Alcohol/week: 0.0 standard drinks  . Drug use: No  . Sexual activity: Not on file  Other Topics Concern  . Not on file  Social History Narrative   Single woman, who lives alone. 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   She does not smoke or drink alcohol.   She occasionally exercises walking on a treadmill.   Social Determinants of Health   Financial Resource Strain:   . Difficulty of Paying Living Expenses: Not on file  Food Insecurity:   . Worried About Programme researcher, broadcasting/film/video in the Last Year: Not on file  . Ran Out of Food in the Last Year: Not on file  Transportation Needs:   . Lack of Transportation (Medical): Not on file  . Lack of Transportation (Non-Medical): Not on file  Physical Activity:   . Days of Exercise per Week: Not on file  . Minutes of Exercise per Session: Not on file  Stress:   . Feeling of Stress : Not on file  Social Connections:   . Frequency of Communication with Friends and  Family: Not on file  . Frequency of Social Gatherings with Friends and Family: Not on file  . Attends Religious Services: Not on file  . Active Member of Clubs or Organizations: Not on file  . Attends Banker Meetings: Not on file  . Marital Status: Not on file  Intimate Partner Violence:   . Fear of Current or Ex-Partner: Not on file  . Emotionally Abused: Not on file  . Physically Abused: Not on file  . Sexually Abused: Not on file  Family History  Problem Relation Age of Onset  . Hypertension Mother   . Asthma Mother   . Anemia Mother   . Hypertension Father   . Stroke Father   . Stroke Maternal Grandmother   . Diabetes Maternal Grandmother   . Cancer Maternal Grandfather   . Diabetes Paternal Grandmother   . Heart attack Paternal Grandfather   . Cancer Paternal Grandfather   . Diabetes Paternal Grandfather   . Atrial fibrillation Brother   . Stroke Brother   . Hypertension Other   . Hypertension Sister   . Hypertension Brother     Review of Systems:  As stated in the HPI and otherwise negative.   BP 128/74   Pulse 77   Ht 5' 1.5" (1.562 m)   Wt 160 lb 12.8 oz (72.9 kg)   LMP 11/16/2009   SpO2 99%   BMI 29.89 kg/m   Physical Examination:  General: Well developed, well nourished, NAD  HEENT: OP clear, mucus membranes moist  SKIN: warm, dry. No rashes. Neuro: No focal deficits  Musculoskeletal: Muscle strength 5/5 all ext  Psychiatric: Mood and affect normal  Neck: No JVD, no carotid bruits, no thyromegaly, no lymphadenopathy.  Lungs:Clear bilaterally, no wheezes, rhonci, crackles Cardiovascular: Regular rate and rhythm. No murmurs, gallops or rubs. Abdomen:Soft. Bowel sounds present. Non-tender.  Extremities: No lower extremity edema. Pulses are 2 + in the bilateral DP/PT.  Cardiac cath 03/18/13: Left main: No obstructive disease.  Left Anterior Descending Artery: Large caliber vessel that courses to the apex. There are several small  caliber diagonal branches. No obstructive disease.  Circumflex Artery: Large caliber vessel with large obtuse marginal branch. No obstructive disease.  Right Coronary Artery: Large dominant vessel with no obstructive disease.  Left Ventricular Angiogram: LVEF=65%.  Impression:  1. No angiographic evidence of CAD  2. Normal LV systolic function  3. Chest pain, cannot fully exclude coronary vasospasm with ST depression on treadmill exercise test.   EKG:  EKG is not ordered today. The ekg ordered today demonstrates   Recent Labs: No results found for requested labs within last 8760 hours.   Lipid Panel No results found for: CHOL, TRIG, HDL, CHOLHDL, VLDL, LDLCALC, LDLDIRECT   Wt Readings from Last 3 Encounters:  04/22/19 160 lb 12.8 oz (72.9 kg)  02/06/19 164 lb 12.8 oz (74.8 kg)  07/24/18 162 lb (73.5 kg)     Other studies Reviewed: Additional studies/ records that were reviewed today include: . 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 She has had no recent chest pain. She seems to be tolerating Norvasc now.    2. HTN: BP controlled. No changes She will take an extra 2.5 of Norvasc on days that her BP is elevated.   3. Lower extremity edema: Weight stable. No volume overload on exam. Lasix as needed.   Current medicines are reviewed at length with the patient today.  The patient does not have concerns regarding medicines.  The following changes have been made:  no change  Labs/ tests ordered today include:   No orders of the defined types were placed in this encounter.   Disposition:   FU with me in 12  months  Signed, Lauree Chandler, MD 04/22/2019 4:24 PM    [place],  [place]  [address] Phone: [phone]; Fax: [phone]

## 2019-04-22 NOTE — Patient Instructions (Signed)
Medication Instructions:  No changes *If you need a refill on your cardiac medications before your next appointment, please call your pharmacy*  Lab Work: none If you have labs (blood work) drawn today and your tests are completely normal, you will receive your results only by: . MyChart Message (if you have MyChart) OR . A paper copy in the mail If you have any lab test that is abnormal or we need to change your treatment, we will call you to review the results.  Testing/Procedures: none  Follow-Up: At [facility], you and your health needs are our priority.  As part of our continuing mission to provide you with exceptional heart care, we have created designated Provider Care Teams.  These Care Teams include your primary Cardiologist (physician) and Advanced Practice Providers (APPs -  Physician Assistants and Nurse Practitioners) who all work together to provide you with the care you need, when you need it.  Your next appointment:   12 month(s)  The format for your next appointment:   Either In Person or Virtual  Provider:   Christopher McAlhany, MD  Other Instructions   

## 2019-06-23 ENCOUNTER — Ambulatory Visit: Payer: BC Managed Care – PPO | Attending: Internal Medicine

## 2019-06-23 DIAGNOSIS — Z23 Encounter for immunization: Secondary | ICD-10-CM | POA: Insufficient documentation

## 2019-06-23 NOTE — Progress Notes (Signed)
   Covid-19 Vaccination Clinic  Name:  Bianca [name]    MRN: [id number] DOB: 1955/12/02  06/23/2019  Bianca [name] was observed post Covid-19 immunization for 15 minutes without incident. She was provided with Vaccine Information Sheet and instruction to access the V-Safe system.   Bianca [name] was instructed to call 911 with any severe reactions post vaccine: Marland Kitchen Difficulty breathing  . Swelling of face and throat  . A fast heartbeat  . A bad rash all over body  . Dizziness and weakness   Immunizations Administered    Name Date Dose VIS Date Route   Pfizer COVID-19 Vaccine 06/23/2019 11:27 AM 0.3 mL 03/29/2019 Intramuscular   Manufacturer: [employer], [employer]   Lot: [id number]   NDC: [address]-2

## 2019-07-24 ENCOUNTER — Ambulatory Visit: Payer: BC Managed Care – PPO

## 2019-07-31 ENCOUNTER — Ambulatory Visit: Payer: BC Managed Care – PPO | Attending: Internal Medicine

## 2019-07-31 DIAGNOSIS — Z23 Encounter for immunization: Secondary | ICD-10-CM

## 2019-07-31 NOTE — Progress Notes (Signed)
   Covid-19 Vaccination Clinic  Name:  CYAN CLIPPINGER    MRN: [id number] DOB: 20-Aug-1955  07/31/2019  Ms. Hommes was observed post Covid-19 immunization for 15 minutes without incident. She was provided with Vaccine Information Sheet and instruction to access the V-Safe system.   Ms. Maultsby was instructed to call 911 with any severe reactions post vaccine: Marland Kitchen Difficulty breathing  . Swelling of face and throat  . A fast heartbeat  . A bad rash all over body  . Dizziness and weakness   Immunizations Administered    Name Date Dose VIS Date Route   Pfizer COVID-19 Vaccine 07/31/2019  1:25 PM 0.3 mL 03/29/2019 Intramuscular   Manufacturer: [employer], [employer]   Lot: [id number]   NDC: [address]-3

## 2019-10-14 ENCOUNTER — Telehealth: Payer: Self-pay | Admitting: Cardiovascular Disease

## 2019-10-14 NOTE — Telephone Encounter (Signed)
STAT if patient feels like he/she is going to faint   1) Are you dizzy now? no  2) Do you feel faint or have you passed out? No  3) Do you have any other symptoms? Sweating, exhaustion, head feels heavy  4) Have you checked your HR and BP (record if available)? Average 135/82 HR 80   Patient states she has been lightheaded and it is worse in the morning. She states it is better when sitting, but when getting up and walking across the room she startes sweating. She states she is also exhausted.

## 2019-10-14 NOTE — Telephone Encounter (Signed)
Pt reports Dr. Azucena Cecil referred her back to cardiology. Pt experiencing lightheadness, weak, feeling faint, exhaused.  Little better last day or 2.  Going on for about 1-2 weeks.  Thinks it started when she had a tooth extraction and had anethesia.    No BP check/HR during the episodes.  No chest pain, no SOB.  liives on second floor. Notes walking up the steps to second floor - has to sit down when she gets inside her apartment.  Weak, exhausted.  Resolves w sitting.    Per patient no anemia on check by PCP and thyroid was normal. Staying hydrated. Using lasix prn.  Took yesterday and wonders if it helped because this morning she didn't feel too bad.  Also reporting generalized muscle aches.  I have scheduled her with APP at [address] tomorrow. Pt grateful for assistance today. I will send a message to chart prep to try to get notes from [facility] PCP.

## 2019-10-15 ENCOUNTER — Encounter: Payer: Self-pay | Admitting: General Practice

## 2019-10-15 ENCOUNTER — Ambulatory Visit: Payer: BC Managed Care – PPO | Admitting: General Practice

## 2019-10-15 ENCOUNTER — Other Ambulatory Visit: Payer: Self-pay

## 2019-10-15 VITALS — BP 112/60 | HR 71 | Temp 97.7°F | Ht 61.5 in | Wt 157.0 lb

## 2019-10-15 DIAGNOSIS — R42 Dizziness and giddiness: Secondary | ICD-10-CM

## 2019-10-15 DIAGNOSIS — R5383 Other fatigue: Secondary | ICD-10-CM | POA: Diagnosis not present

## 2019-10-15 DIAGNOSIS — I10 Essential (primary) hypertension: Secondary | ICD-10-CM

## 2019-10-15 DIAGNOSIS — R002 Palpitations: Secondary | ICD-10-CM

## 2019-10-15 DIAGNOSIS — I201 Angina pectoris with documented spasm: Secondary | ICD-10-CM

## 2019-10-15 MED ORDER — AMLODIPINE BESYLATE 2.5 MG PO TABS: 2.5000 mg | ORAL_TABLET | Freq: Every day | ORAL | 3 refills | Status: DC

## 2019-10-15 NOTE — Patient Instructions (Signed)
Medication Instructions:  DECREASE AMLODIPINE 2.5MG  DAILY *If you need a refill on your cardiac medications before your next appointment, please call your pharmacy*  Special Instructions TAKE AND LOG YOUR BLOOD PRESSURE DAILY-1 HOUR AFTER TAKING YOUR MEDICATION  PLEASE READ AND FOLLOW SALTY 6-ATTACHED  PLEASE PURCHASE AND WEAR COMPRESSION STOCKINGS DAILY AND OFF AT BEDTIME. Compression stockings are elastic socks that squeeze the legs. They help to increase blood flow to the legs and to decrease swelling in the legs from fluid retention, and reduce the chance of developing blood clots in the lower legs. Please put on in the AM when dressing and off at night when dressing for bed. PLEASE MAKE SURE TO ELEVATE YOUR LEGS WHILE SITTING, THIS WILL HELP WITH THE SWELLING ALSO.,  Follow-Up: Your next appointment:  1 week(s)  In Person with JESSE CLEAVER, FNP-C  At [facility], you and your health needs are our priority.  As part of our continuing mission to provide you with exceptional heart care, we have created designated Provider Care Teams.  These Care Teams include your primary Cardiologist (physician) and Advanced Practice Providers (APPs -  Physician Assistants and Nurse Practitioners) who all work together to provide you with the care you need, when you need it.

## 2019-10-15 NOTE — Progress Notes (Addendum)
Cardiology Clinic Note   Patient Name: Bianca [name] Date of Encounter: 10/15/2019  Primary Care Provider:  Tally Joe, MD Primary Cardiologist:  Verne Carrow, MD  Patient Profile    Bianca [name] 64 year old female presents to the clinic today for an evaluation of her dizziness/lightheadedness/sweating.  Past Medical History    Past Medical History:  Diagnosis Date  . Allergy   . Anemia   . Anxiety   . Asthma   . H/O exercise stress test 2008; October 2014   was normal in 2008; positive for ischemia with inferior and lateral ST depressions October 2014  . Hx of echocardiogram 091/2012   relatively normal as well. No significant valvar lesions. Normal Ef.  . Hypertension   . Migraines   . Palpitations    Past Surgical History:  Procedure Laterality Date  . ABDOMINAL EXPLORATION SURGERY  1980  . CARDIAC CATHETERIZATION  04/2010   with no evidence of ischemia or significant coronary disease to speak of, normal LV function with relatively normal EDP.  Marland Kitchen LEFT HEART CATHETERIZATION WITH CORONARY ANGIOGRAM N/A 03/18/2013   Procedure: LEFT HEART CATHETERIZATION WITH CORONARY ANGIOGRAM;  Surgeon: Kathleene Hazel, MD;  Location: [facility] CATH LAB;  Service: Cardiovascular;  Laterality: N/A;  . NASAL SINUS SURGERY      Allergies  Allergies  Allergen Reactions  . Codeine Other (See Comments)    Other reaction(s): Other (See Comments) Pounding in head  Pounding in head   . Amoxicillin     Diarrhea and vomiting.  Has patient had a PCN reaction causing immediate rash, facial/tongue/throat swelling, SOB or lightheadedness with hypotension: No Has patient had a PCN reaction causing severe rash involving mucus membranes or skin necrosis: No Has patient had a PCN reaction that required hospitalization No Has patient had a PCN reaction occurring within the last 10 years: No If all of the above answers are "NO", then may proceed with Cephalosporin use.   . Augmentin  [Amoxicillin-Pot Clavulanate]     Diarrhea and vomiting.   . Bee Venom     Swelling  . Latex Hives  . Metoprolol Other (See Comments)    Hair loss  . Milk-Related Compounds     Diarrhea & gas  . Shellfish Allergy     HIVES, SWELLING, N/V & DIARREHA  . Sulfa Antibiotics   . Sulfur     History of Present Illness    Bianca [name] has a PMH of chest pain, hypertension, asthma, anxiety, and suspicion for coronary vasospasm.  She has known chronic chest pain.  She underwent cardiac catheterization in 2012 and again in 2014 both showed normal coronary arteries.  Coronary vasospasm was suspected as the culprit.  Echocardiogram 1/12 showed normal LV function, no valvular issues.  She has tolerated Norvasc well.  When she was seen 3/19 she was noted to have mild edema.  She was started on Lasix at that time.  An echo was also ordered which was not done.  She was last seen by Dr. Clifton [name] on 1/21.  During that time she felt well, had no chest pain, no dyspnea no palpitations.  She denied lower extremity edema, orthopnea, PND, dizziness, and symptoms of syncope.  She was noted to have slightly elevated blood pressure which she attributed to increased stress.  She was using Xanax as needed at that time.  She contacted the nurse triage line on 10/14/2019 with complaints of lightheadedness that was worse in the morning.  She stated that her lightheadedness  becomes better when she sits.  She also notes that she has increased sweating with activity and has been feeling fairly exhausted.  She presents to the clinic today for evaluation and states she started to notice lightheadedness and dizziness after her tooth extraction on 10/03/2019.  She indicates that she had a wisdom tooth extracted and another tooth.  She was not told that she had an unusual amount of blood loss.  She did have lab work done on 10/10/2019.  We will request her labs from [facility].  She states that she has also lost about 10 pounds through diet  and exercise.  She states that she has decreased her amlodipine to 2.5 mg daily due to her symptoms.  She states that she drinks about 60 to 80 mL/day.  She denies palpitations and bleeding.  She indicates that she feels like she did when she had heavy menstruation.  She no longer has periods.  She does feel like she has gotten somewhat better since her tooth extraction.  I have asked her to maintain her hydration, increase the sodium in her diet for the next week, continue her 2.5 mg amlodipine daily, and follow-up with Bianca [name] in 4 weeks.  I will give her a blood pressure log and wait on the results from her blood work.  Today she denies chest pain, shortness of breath, lower extremity edema, fatigue, palpitations, melena, hematuria, hemoptysis, diaphoresis, weakness, presyncope, syncope, orthopnea, and PND.   Home Medications    Prior to Admission medications   Medication Sig Start Date End Date Taking? Authorizing Provider  acetaminophen (TYLENOL) 325 MG tablet Take 500 mg by mouth every 6 (six) hours as needed for mild pain.     [provider]  albuterol (VENTOLIN HFA) 108 (90 Base) MCG/ACT inhaler Inhale 1-2 puffs into the lungs every 6 (six) hours as needed for wheezing or shortness of breath.    [provider]  ALPRAZolam Prudy Feeler(XANAX) 0.5 MG tablet Take 0.5 mg by mouth as needed. 03/26/19   [provider]  amLODipine (NORVASC) 2.5 MG tablet Take 1 tablet (2.5 mg total) by mouth 2 (two) times daily. 02/11/19   Tereso NewcomerWeaver, Scott T, PA-C  AUVI-Q 0.3 MG/0.3ML SOAJ injection Inject 0.3 mLs (0.3 mg total) into the muscle as needed for anaphylaxis. 07/03/18   Kozlow, Alvira PhilipsEric J, MD  FLUoxetine (PROZAC) 10 MG tablet Take 20 mg by mouth daily.     [provider]  fluticasone (FLONASE) 50 MCG/ACT nasal spray fluticasone propionate 50 mcg/actuation nasal spray,suspension    [provider]  furosemide (LASIX) 20 MG tablet TAKE 1 TABLET BY MOUTH DAILY AS NEEDED FOR SWELLING  08/27/18   Kathleene HazelMcAlhany, Christopher D, MD  guaiFENesin (MUCINEX) 600 MG 12 hr tablet Take by mouth as needed.     [provider]  ipratropium-albuterol (DUONEB) 0.5-2.5 (3) MG/3ML SOLN Take 3 mLs by nebulization every 4 (four) hours as needed. 12/27/18   Kozlow, Alvira PhilipsEric J, MD  LORazepam (ATIVAN) 1 MG tablet Take 0.5 mg by mouth daily as needed. Take 1/2 to 1 maximum twice daily for anxiety only as needed 12/24/11   Peyton NajjarHopper, David H, MD  Multiple Vitamin (MULTIVITAMIN) capsule Take 1 capsule by mouth daily.     [provider]  psyllium (METAMUCIL) 58.6 % powder Take 1 packet by mouth daily.     [provider]  SYMBICORT 160-4.5 MCG/ACT inhaler Inhale 2 puffs into the lungs 2 (two) times daily. 01/14/19   Alfonse SpruceGallagher, Joel Louis, MD  Family History    Family History  Problem Relation Age of Onset  . Hypertension Mother   . Asthma Mother   . Anemia Mother   . Hypertension Father   . Stroke Father   . Stroke Maternal Grandmother   . Diabetes Maternal Grandmother   . Cancer Maternal Grandfather   . Diabetes Paternal Grandmother   . Heart attack Paternal Grandfather   . Cancer Paternal Grandfather   . Diabetes Paternal Grandfather   . Atrial fibrillation Brother   . Stroke Brother   . Hypertension Other   . Hypertension Sister   . Hypertension Brother    She indicated that her mother is alive. She indicated that her father is alive. She indicated that the status of her sister is unknown. She indicated that one of her four brothers is deceased. She indicated that her maternal grandmother is deceased. She indicated that her maternal grandfather is deceased. She indicated that her paternal grandmother is deceased. She indicated that her paternal grandfather is deceased. She indicated that the status of her other is unknown.  Social History    Social History   Socioeconomic History  . Marital status: Single    Spouse name: Not on file  . Number of children: 0  . Years  of education: Not on file  . Highest education level: Not on file  Occupational History  . Occupation: Retired Chartered loss adjuster  Tobacco Use  . Smoking status: Never Smoker  . Smokeless tobacco: Never Used  Vaping Use  . Vaping Use: Never used  Substance and Sexual Activity  . Alcohol use: No    Alcohol/week: 0.0 standard drinks  . Drug use: No  . Sexual activity: Not on file  Other Topics Concern  . Not on file  Social History Narrative   Single woman, who lives alone. 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   She does not smoke or drink alcohol.   She occasionally exercises walking on a treadmill.   Social Determinants of Health   Financial Resource Strain:   . Difficulty of Paying Living Expenses:   Food Insecurity:   . Worried About Programme researcher, broadcasting/film/video in the Last Year:   . Barista in the Last Year:   Transportation Needs:   . Freight forwarder (Medical):   Marland Kitchen Lack of Transportation (Non-Medical):   Physical Activity:   . Days of Exercise per Week:   . Minutes of Exercise per Session:   Stress:   . Feeling of Stress :   Social Connections:   . Frequency of Communication with Friends and Family:   . Frequency of Social Gatherings with Friends and Family:   . Attends Religious Services:   . Active Member of Clubs or Organizations:   . Attends Banker Meetings:   Marland Kitchen Marital Status:   Intimate Partner Violence:   . Fear of Current or Ex-Partner:   . Emotionally Abused:   Marland Kitchen Physically Abused:   . Sexually Abused:      Review of Systems    General:  No chills, fever, night sweats or weight changes.  Cardiovascular:  No chest pain, dyspnea on exertion, edema, orthopnea, palpitations, paroxysmal nocturnal dyspnea. Dermatological: No rash, lesions/masses Respiratory: No cough, dyspnea Urologic: No hematuria, dysuria Abdominal:   No  nausea, vomiting, diarrhea, bright red blood per rectum, melena, or hematemesis Neurologic:  No visual changes, wkns, changes in mental status. All other systems reviewed and are otherwise negative except as noted above.  Physical Exam    VS:  BP 112/60 (BP Location: Left Arm, Patient Position: Sitting, Cuff Size: Normal)   Pulse 71   Temp 97.7 F (36.5 C)   Ht 5' 1.5" (1.562 m)   Wt 157 lb (71.2 kg)   LMP 11/16/2009   BMI 29.18 kg/m  , BMI Body mass index is 29.18 kg/m. GEN: Well nourished, well developed, in no acute distress. HEENT: normal. Neck: Supple, no JVD, carotid bruits, or masses. Cardiac: RRR, no murmurs, rubs, or gallops. No clubbing, cyanosis, edema.  Radials/DP/PT 2+ and equal bilaterally.  Respiratory:  Respirations regular and unlabored, clear to auscultation bilaterally. GI: Soft, nontender, nondistended, BS + x 4. MS: no deformity or atrophy. Skin: warm and dry, no rash. Neuro:  Strength and sensation are intact. Psych: Normal affect.  Accessory Clinical Findings    ECG personally reviewed by me today-normal sinus rhythm 71 bpm- No acute changes  EKG 02/06/2019 Normal sinus rhythm 63 bpm  Echocardiogram 05/12/2010 LVEF greater than 55, right ventricle normal, no valvular abnormalities, no pericardial effusion.  Overall, entirely normal echocardiogram.  Cardiac catheterization 2012/2014 Normal coronary arteries   Assessment & Plan   1.  Lightheadedness/fatigue-notices with position changes and increased physical activity.  Had teeth extracted on 10/03/2019.  Has also had a 10 pound weight loss.  Has noticed her blood pressures have been in the 100s-1 teens over 70s and 80s.  Is feeling somewhat better as she gets further away from her tooth extraction. Maintain p.o. hydration Change positions slowly Keep blood pressure log Liberalize sodium in diet Lower extremity support stockings-[place] lower extremity support stockings sheet given Labs requested  from [facility]-hemoglobin normal at 12.7 hematocrit 35.4 BUN 15 creatinine 0.69 TSH 0.36.  Essential hypertension-BP today 112/60.  Well-controlled at home. Continue amlodipine, furosemide, Decrease amlodipine to 2.5 daily Heart healthy low-sodium diet-salty 6 given Increase physical activity as tolerated  Palpitations-  No further episodes of palpitations. Avoid triggers caffeine, chocolate, EtOH etc. Heart healthy  diet Increase physical activity as tolerated  History of coronary artery vasospasm-no chest pain today.  Cardiac catheterizations in 2012 and 12/14 showed normal coronary arteries.  She was placed on Norvasc at that time. Continue amlodipine Heart healthy low-sodium diet-salty 6 given Increase physical activity as tolerated  Lower extremity edema-generalized nonpitting bilateral lower extremities edema.  Weight today 157. Continue furosemide as needed Heart healthy diet Increase physical activity as tolerated Lower extremity support stockings-[place] elastics sheet given.  Disposition: Follow-up with me or Dr. Clifton [name] in 4 weeks.    Thomasene Ripple. Makenzy Krist NP-C    10/15/2019, 2:09 PM  [facility] [address] Office [phone] Fax [phone]

## 2019-10-17 ENCOUNTER — Telehealth: Payer: Self-pay

## 2019-10-17 NOTE — Telephone Encounter (Signed)
PLACED NOTES FROM [facility] IN MD'S  BOX

## 2019-10-17 NOTE — Telephone Encounter (Signed)
Called [facility] AT [facility] TO REQUEST LAST OV NOTES

## 2019-11-13 ENCOUNTER — Ambulatory Visit: Payer: BC Managed Care – PPO | Admitting: General Practice

## 2019-12-24 ENCOUNTER — Telehealth: Payer: Self-pay | Admitting: Allergy and Immunology

## 2019-12-24 NOTE — Telephone Encounter (Signed)
Patient called and said that she got a neb machine from [place] and it did not have battery pack with it and she needs one. And she also need a rx for proair. [phone]

## 2019-12-24 NOTE — Telephone Encounter (Signed)
Will discuss with Bianca [name] in regards to the Nebulizer and sending in ProAir.

## 2019-12-25 ENCOUNTER — Other Ambulatory Visit: Payer: Self-pay | Admitting: *Deleted

## 2019-12-25 MED ORDER — ALBUTEROL SULFATE HFA 108 (90 BASE) MCG/ACT IN AERS: 2.0000 | INHALATION_SPRAY | Freq: Four times a day (QID) | RESPIRATORY_TRACT | 0 refills | Status: DC | PRN

## 2019-12-25 NOTE — Telephone Encounter (Signed)
Beth do you recommend anything different to do for this patient?

## 2019-12-25 NOTE — Telephone Encounter (Signed)
Called and spoke with the patient and gave her the phone number for [employer] to help with the missing piece. The phone number provided is [phone]. I provided her a courtesy refill of the ProAir inhaler and informed her that she needed an office visit for further refills. Patient verbalized understanding.

## 2019-12-25 NOTE — Telephone Encounter (Signed)
Patient called back for Bianca [name] and states that she called [employer] and they told her that the battery pack is not included due to insurance. Patient would like to know if something else can be ordered.  Please advise.

## 2020-01-03 NOTE — Telephone Encounter (Signed)
Will look into options for patient.

## 2020-01-08 NOTE — Telephone Encounter (Signed)
Called and spoke with Fritzi Mandes at [employer].  The Portable Compressor Nebulizer usually comes with Rechargeable lithium battery pack along with AC power adapter and 12 V Car adapter as power sources.  [employer] has been Out of Stock of the Rechargeable lithium battery pack.  The battery pack is also not covered by insurance as a replacement coverage.  If patient needs [facility] power adapter, she can get that online or at electronic department per Fritzi Mandes. Left message for patient to return to office to discuss.

## 2020-01-10 NOTE — Telephone Encounter (Addendum)
Called and spoke with patient.  Patient states it was the power adapter that she had misplaced to her nebulizer machine.  She did go to [employer] and get a replacement.  I did update the patient on my conversation with Kirsten from [employer]. Patient is good with all her nebulizer supplies and parts at this time.

## 2020-01-23 ENCOUNTER — Other Ambulatory Visit: Payer: Self-pay | Admitting: Orthopedic Surgery

## 2020-01-23 DIAGNOSIS — M25562 Pain in left knee: Secondary | ICD-10-CM

## 2020-01-23 DIAGNOSIS — R531 Weakness: Secondary | ICD-10-CM

## 2020-01-27 ENCOUNTER — Ambulatory Visit
Admission: RE | Admit: 2020-01-27 | Discharge: 2020-01-27 | Disposition: A | Discharge status: Home / Self Care | Payer: BC Managed Care – PPO | Source: Ambulatory Visit | Attending: Family Medicine | Admitting: Family Medicine

## 2020-01-27 ENCOUNTER — Other Ambulatory Visit: Payer: Self-pay | Admitting: Family Medicine

## 2020-01-27 DIAGNOSIS — R0781 Pleurodynia: Secondary | ICD-10-CM

## 2020-02-11 ENCOUNTER — Encounter: Payer: Self-pay | Admitting: Physician Assistant

## 2020-02-13 ENCOUNTER — Other Ambulatory Visit: Payer: Self-pay | Admitting: Physician Assistant

## 2020-02-24 ENCOUNTER — Telehealth: Payer: Self-pay | Admitting: Cardiovascular Disease

## 2020-02-24 MED ORDER — AMLODIPINE BESYLATE 2.5 MG PO TABS
2.5000 mg | ORAL_TABLET | Freq: Every day | ORAL | 2 refills | Status: DC
Start: 2020-02-24 — End: 2020-12-02

## 2020-02-24 NOTE — Telephone Encounter (Signed)
°*  STAT* If patient is at the pharmacy, call can be transferred to refill team.   1. Which medications need to be refilled? (please list name of each medication and dose if known) amlodipine 2.5  2. Which pharmacy/location (including street and city if local pharmacy) is medication to be sent to? [facility]   3. Do they need a 30 day or 90 day supply? 30

## 2020-02-24 NOTE — Telephone Encounter (Signed)
Pt's medication was sent to pt's pharmacy as requested. Confirmation received.  °

## 2020-02-26 ENCOUNTER — Encounter: Payer: Self-pay | Admitting: Physician Assistant

## 2020-02-26 ENCOUNTER — Ambulatory Visit: Payer: BC Managed Care – PPO | Admitting: Physician Assistant

## 2020-02-26 ENCOUNTER — Other Ambulatory Visit (INDEPENDENT_AMBULATORY_CARE_PROVIDER_SITE_OTHER): Payer: BC Managed Care – PPO

## 2020-02-26 VITALS — BP 122/74 | HR 70 | Ht 61.0 in | Wt 154.0 lb

## 2020-02-26 DIAGNOSIS — R58 Hemorrhage, not elsewhere classified: Secondary | ICD-10-CM | POA: Diagnosis not present

## 2020-02-26 DIAGNOSIS — R1031 Right lower quadrant pain: Secondary | ICD-10-CM | POA: Diagnosis not present

## 2020-02-26 DIAGNOSIS — R109 Unspecified abdominal pain: Secondary | ICD-10-CM

## 2020-02-26 LAB — BASIC METABOLIC PANEL
BUN: 16 mg/dL (ref 6–23)
CO2: 32 mEq/L (ref 19–32)
Calcium: 9.9 mg/dL (ref 8.4–10.5)
Chloride: 101 mEq/L (ref 96–112)
Creatinine, Ser: 0.78 mg/dL (ref 0.40–1.20)
GFR: 80.69 mL/min (ref >60.00)
Glucose, Bld: 75 mg/dL (ref 70–99)
Potassium: 3.5 mEq/L (ref 3.5–5.1)
Sodium: 138 mEq/L (ref 135–145)

## 2020-02-26 MED ORDER — HYDROCORTISONE ACETATE 25 MG RE SUPP: 25.0000 mg | Freq: Every evening | RECTAL | 1 refills | Status: DC

## 2020-02-26 NOTE — Patient Instructions (Signed)
If you are age 63 or older, your body mass index should be between 23-30. Your Body mass index is 29.1 kg/m. If this is out of the aforementioned range listed, please consider follow up with your Primary Care Provider.  If you are age 64 or younger, your body mass index should be between 19-25. Your Body mass index is 29.1 kg/m. If this is out of the aformentioned range listed, please consider follow up with your Primary Care Provider.   You have been scheduled for a CT scan of the abdomen and pelvis at [facility], 1st floor Radiology. You are scheduled on 03/05/20  at 3:00 pm. You should arrive 15 minutes prior to your appointment time for registration.  Please pick up 2 bottles of contrast from [place] at least 3 days prior to your scan. The solution may taste better if refrigerated, but do NOT add ice or any other liquid to this solution. Shake well before drinking.   Please follow the written instructions below on the day of your exam:   1) Do not eat anything after 11:00 am (4 hours prior to your test)   2) Drink 1 bottle of contrast @ 1:00 pm (2 hours prior to your exam)  Remember to shake well before drinking and do NOT pour over ice.     Drink 1 bottle of contrast @ 2:00 pm (1 hour prior to your exam)   You may take any medications as prescribed with a small amount of water, if necessary. If you take any of the following medications: METFORMIN, GLUCOPHAGE, GLUCOVANCE, AVANDAMET, RIOMET, FORTAMET, ACTOPLUS MET, JANUMET, GLUMETZA or METAGLIP, you MAY be asked to HOLD this medication 48 hours AFTER the exam.   The purpose of you drinking the oral contrast is to aid in the visualization of your intestinal tract. The contrast solution may cause some diarrhea. Depending on your individual set of symptoms, you may also receive an intravenous injection of x-ray contrast/dye. Plan on being at [place] for 45 minutes or longer, depending on the type of exam you are having performed.    If you have any questions regarding your exam or if you need to reschedule, you may call [facility] at [phone] between the hours of 8:00 am and 5:00 pm, Monday-Friday.   Your provider has requested that you go to the basement level for lab work before leaving today. Press "B" on the elevator. The lab is located at the first door on the left as you exit the elevator.  START Anusol suppositories 1 suppository at bed time for 5-7 nights.  Follow up pending the results of your CT. 

## 2020-02-26 NOTE — Progress Notes (Signed)
Subjective:    Patient ID: Bianca [name], female    DOB: 06/24/1955, 64 y.o.   MRN: [id number]  HPI Bianca [name] is a pleasant 64 year old African-American female, new to GI today referred by Bianca Joe  MD with concerns of a rectal knot. Patient previously known to Dr. Kinnie [name] and had undergone colonoscopy in February 2016.  This was a negative exam with exception of internal and external hemorrhoids.  Patient was offered hemorrhoidal banding at that time but says she decided not to pursue as she was not having severe symptoms. She has had some intermittent internal and external hemorrhoidal symptoms since and uses cream as needed.  She may get occasional discomfort but has not had any significant pain and no bleeding. A couple of weeks ago she had been using hemorrhoidal cream and noted a small knot, the size of a pea on the inside of the anus more on the right side.  This was not painful.  She has been concerned and wanted to be evaluated.  She has not had any changes in her bowel habits, does have some mild constipation, no melena or hematochezia, no complaints of abdominal pain but says she has been having problems with right flank pain over the past several months.  She did apparently have some plain films done which were unremarkable and she thinks this is likely musculoskeletal. Family history pertinent for sister with history of polyp last year and one uncle with colon cancer diagnosed in his late 65s.  Review of Systems Well-developed well-nourished female/female in no acute distress.  Height, Weight, BMI  HEENT; nontraumatic normocephalic, EOMI, PE R LA, sclera anicteric. Oropharynx; Neck; supple, no JVD Cardiovascular; regular rate and rhythm with S1-S2, no murmur rub or gallop Pulmonary; Clear bilaterally Abdomen; soft, nontender, nondistended, no palpable mass or hepatosplenomegaly, bowel sounds are active Rectal; Skin; benign exam, no jaundice rash or appreciable lesions Extremities;  no clubbing cyanosis or edema skin warm and dry Neuro/Psych; alert and oriented x4, grossly nonfocal mood and affect appropriate  Outpatient Encounter Medications as of 02/26/2020  Medication Sig  . acetaminophen (TYLENOL) 325 MG tablet Take 500 mg by mouth every 6 (six) hours as needed for mild pain.   Marland Kitchen albuterol (VENTOLIN HFA) 108 (90 Base) MCG/ACT inhaler Inhale 1-2 puffs into the lungs every 6 (six) hours as needed for wheezing or shortness of breath.  . ALPRAZolam (XANAX) 0.5 MG tablet Take 0.5 mg by mouth as needed.  Marland Kitchen amLODipine (NORVASC) 2.5 MG tablet Take 1 tablet (2.5 mg total) by mouth daily.  Marland Kitchen ascorbic acid (VITAMIN C) 500 MG tablet Take 500 mg by mouth daily.  Marland Kitchen AUVI-Q 0.3 MG/0.3ML SOAJ injection Inject 0.3 mLs (0.3 mg total) into the muscle as needed for anaphylaxis.  . cholecalciferol (VITAMIN D3) 25 MCG (1000 UNIT) tablet Take 1,000 Units by mouth daily.  Marland Kitchen FLUoxetine (PROZAC) 10 MG tablet Take 20 mg by mouth daily.   . fluticasone (FLONASE) 50 MCG/ACT nasal spray fluticasone propionate 50 mcg/actuation nasal spray,suspension  . guaiFENesin (MUCINEX) 600 MG 12 hr tablet Take by mouth as needed.   Marland Kitchen ipratropium-albuterol (DUONEB) 0.5-2.5 (3) MG/3ML SOLN Take 3 mLs by nebulization every 4 (four) hours as needed.  . Multiple Vitamin (MULTIVITAMIN) capsule Take 1 capsule by mouth daily.   . psyllium (METAMUCIL) 58.6 % powder Take 1 packet by mouth daily.   . SYMBICORT 160-4.5 MCG/ACT inhaler Inhale 2 puffs into the lungs 2 (two) times daily.  . hydrocortisone (ANUSOL-HC) 25 MG suppository  Place 1 suppository (25 mg total) rectally at bedtime.  . [DISCONTINUED] albuterol (VENTOLIN HFA) 108 (90 Base) MCG/ACT inhaler Inhale 2 puffs into the lungs every 6 (six) hours as needed for wheezing or shortness of breath.  . [DISCONTINUED] furosemide (LASIX) 20 MG tablet TAKE 1 TABLET BY MOUTH DAILY AS NEEDED FOR SWELLING  . [DISCONTINUED] LORazepam (ATIVAN) 1 MG tablet Take 0.5 mg by mouth  daily as needed. Take 1/2 to 1 maximum twice daily for anxiety only as needed   No facility-administered encounter medications on file as of 02/26/2020.   Allergies  Allergen Reactions  . Codeine Other (See Comments)    Other reaction(s): Other (See Comments) Pounding in head  Pounding in head   . Amoxicillin     Diarrhea and vomiting.  Has patient had a PCN reaction causing immediate rash, facial/tongue/throat swelling, SOB or lightheadedness with hypotension: No Has patient had a PCN reaction causing severe rash involving mucus membranes or skin necrosis: No Has patient had a PCN reaction that required hospitalization No Has patient had a PCN reaction occurring within the last 10 years: No If all of the above answers are "NO", then may proceed with Cephalosporin use.   . Augmentin [Amoxicillin-Pot Clavulanate]     Diarrhea and vomiting.   . Bee Venom     Swelling  . Latex Hives  . Metoprolol Other (See Comments)    Hair loss  . Milk-Related Compounds     Diarrhea & gas  . Shellfish Allergy     HIVES, SWELLING, N/V & DIARREHA  . Sulfa Antibiotics   . Sulfur    Patient Active Problem List   Diagnosis Date Noted  . Chest pain 03/13/2013  . Abnormal stress ECG with treadmill 02/24/2013  . HTN (hypertension) 12/24/2011  . Heart palpitations 12/24/2011  . Awareness of heartbeats 11/30/2011   Social History   Socioeconomic History  . Marital status: Single    Spouse name: Not on file  . Number of children: 0  . Years of education: Not on file  . Highest education level: Not on file  Occupational History  . Occupation: Retired Chartered loss adjuster  Tobacco Use  . Smoking status: Never Smoker  . Smokeless tobacco: Never Used  Vaping Use  . Vaping Use: Never used  Substance and Sexual Activity  . Alcohol use: No    Alcohol/week: 0.0 standard drinks  . Drug use: No  . Sexual activity: Not Currently  Other Topics Concern  . Not on file  Social History Narrative   Single  woman, who lives alone. 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   She does not smoke or drink alcohol.   She occasionally exercises walking on a treadmill.   Social Determinants of Health   Financial Resource Strain:   . Difficulty of Paying Living Expenses: Not on file  Food Insecurity:   . Worried About Programme researcher, broadcasting/film/video in the Last Year: Not on file  . Ran Out of Food in the Last Year: Not on file  Transportation Needs:   . Lack of Transportation (Medical): Not on file  . Lack of Transportation (Non-Medical): Not on file  Physical Activity:   . Days of Exercise per Week: Not on file  . Minutes of Exercise per Session: Not on file  Stress:   . Feeling of Stress : Not on file  Social Connections:   .  Frequency of Communication with Friends and Family: Not on file  . Frequency of Social Gatherings with Friends and Family: Not on file  . Attends Religious Services: Not on file  . Active Member of Clubs or Organizations: Not on file  . Attends Banker Meetings: Not on file  . Marital Status: Not on file  Intimate Partner Violence:   . Fear of Current or Ex-Partner: Not on file  . Emotionally Abused: Not on file  . Physically Abused: Not on file  . Sexually Abused: Not on file    Ms. Reser's family history includes Anemia in her mother; Asthma in her mother; Atrial fibrillation in her brother; Cancer in her maternal grandfather and paternal grandfather; Diabetes in her maternal grandmother, paternal grandfather, and paternal grandmother; Heart attack in her paternal grandfather; Hypertension in her brother, father, mother, sister, and another family member; Stroke in her brother, father, and maternal grandmother.      Objective:    Vitals:   02/26/20 1525  BP: 122/74  Pulse: 70    Physical Exam Well-developed well-nourished older African-American female  in no acute distress.  Height, Weight, BMI 29.1  HEENT; nontraumatic normocephalic, EOMI, PER R LA, sclera anicteric. Oropharynx; not examined Neck; supple, no JVD Cardiovascular; regular rate and rhythm with S1-S2, no murmur rub or gallop Pulmonary; Clear bilaterally Abdomen; soft, she is tender in the right mid quadrant and right lower quadrant, nondistended, no palpable mass or hepatosplenomegaly, bowel sounds are active Rectal; large nonedematous external hemorrhoid, nontender to digital exam, no palpable fissure, on anoscopy she has 1 internal hemorrhoid prominent Skin; benign exam, no jaundice rash or appreciable lesions Extremities; no clubbing cyanosis or edema skin warm and dry Neuro/Psych; alert and oriented x4, grossly nonfocal mood and affect appropriate      Assessment & Plan:   #68 64 year old African-American female with internal hemorrhoid, not currently symptomatic but patient had noticed on self-exam. 2.  Intermittent symptomatic external hemorrhoids #3 colon cancer screening-up-to-date with negative colonoscopy February 2016 Dr. Kinnie [name] #4 right-sided abdominal pain and tenderness on exam and recent complaints of right flank pain though she had not noted abdominal pain was quite tender on exam today. Etiology not clear rule out intra-abdominal inflammatory process  Plan; Anusol HC suppositories nightly x5 to 7 days as needed for internal hemorrhoidal symptoms. schedule for CT of the abdomen pelvis with contrast. Further recommendations pending results of CT Patient will be established with Dr. Leatha Gilding PA-C 02/26/2020   Cc: Bianca Joe, MD

## 2020-02-27 NOTE — Progress Notes (Signed)
Excellent assessment and plan noted. GI PA-C to follow-up on the instituted plan

## 2020-03-05 ENCOUNTER — Ambulatory Visit (HOSPITAL_COMMUNITY): Payer: BC Managed Care – PPO

## 2020-03-16 ENCOUNTER — Ambulatory Visit (HOSPITAL_COMMUNITY): Payer: BC Managed Care – PPO

## 2020-04-15 ENCOUNTER — Telehealth: Payer: Self-pay | Admitting: Cardiovascular Disease

## 2020-04-15 MED ORDER — FUROSEMIDE 20 MG PO TABS: 20.0000 mg | ORAL_TABLET | Freq: Every day | ORAL | 0 refills | Status: DC | PRN

## 2020-04-15 NOTE — Telephone Encounter (Signed)
*  STAT* If patient is at the pharmacy, call can be transferred to refill team.   1. Which medications need to be refilled? (please list name of each medication and dose if known)  furosemide (LASIX) 20 MG tablet [[id number]] DISCONTINUED   2. Which pharmacy/location (including street and city if local pharmacy) is medication to be sent to? [id number] [place], [place] - [address] AT [facility] OF [facility] [address]  [address] [place], [place] [address]  Phone:  [phone] Fax:  [phone]     3. Do they need a 30 day or 90 day supply?  Pt stated that she uses this med as needed and she has not needed it in a while.  Pt stated that she is having a little puffiness in her ankles   Best number [phone]

## 2020-06-15 ENCOUNTER — Telehealth (HOSPITAL_COMMUNITY): Payer: Self-pay | Admitting: Cardiovascular Disease

## 2020-06-15 ENCOUNTER — Encounter: Payer: Self-pay | Admitting: Cardiovascular Disease

## 2020-06-15 ENCOUNTER — Ambulatory Visit: Payer: BC Managed Care – PPO | Admitting: Cardiovascular Disease

## 2020-06-15 ENCOUNTER — Other Ambulatory Visit: Payer: Self-pay

## 2020-06-15 VITALS — BP 118/70 | HR 71 | Ht 61.0 in | Wt 152.0 lb

## 2020-06-15 DIAGNOSIS — I201 Angina pectoris with documented spasm: Secondary | ICD-10-CM | POA: Diagnosis not present

## 2020-06-15 DIAGNOSIS — I10 Essential (primary) hypertension: Secondary | ICD-10-CM | POA: Diagnosis not present

## 2020-06-15 NOTE — Addendum Note (Signed)
Addended by: Lendon Ka on: 06/15/2020 09:37 AM   Modules accepted: Orders

## 2020-06-15 NOTE — Progress Notes (Signed)
r   Chief Complaint  Patient presents with  . Follow-up    Coronary vasospasm    History of Present Illness: 65 yo female with history of chest pain, HTN, asthma, anxiety and suspicion for coronary vasospasm who is here today for cardiac follow up. She has had chronic chest pain with a normal cardiac cath in 2012 and again in 2014. 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 Echo January 2012 with normal LV function, no valve issues. She has been on Norvasc and has tolerated this well. I saw her in March 2019 and she c/o mild edema. I ordered an echo and started Lasix. She cancelled the echo.   She is here today for follow up. The patient denies any chest pain, dyspnea, palpitations, orthopnea, PND, dizziness, near syncope or syncope. She has occasional LE edema. She takes Lasix occasionally.    Primary Care Physician: Tally Joe, MD  Past Medical History:  Diagnosis Date  . Allergy   . Anemia   . Anxiety   . Asthma   . H/O exercise stress test 2008; October 2014   was normal in 2008; positive for ischemia with inferior and lateral ST depressions October 2014  . Hx of echocardiogram 091/2012   relatively normal as well. No significant valvar lesions. Normal Ef.  . Hypertension   . Migraines   . Palpitations     Past Surgical History:  Procedure Laterality Date  . ABDOMINAL EXPLORATION SURGERY  1980  . CARDIAC CATHETERIZATION  04/2010   with no evidence of ischemia or significant coronary disease to speak of, normal LV function with relatively normal EDP.  Marland Kitchen LEFT HEART CATHETERIZATION WITH CORONARY ANGIOGRAM N/A 03/18/2013   Procedure: LEFT HEART CATHETERIZATION WITH CORONARY ANGIOGRAM;  Surgeon: Kathleene Hazel, MD;  Location: [facility] CATH LAB;  Service: Cardiovascular;  Laterality: N/A;  . NASAL SINUS SURGERY      Current Outpatient Medications  Medication Sig Dispense Refill  . acetaminophen (TYLENOL) 325 MG tablet Take 500 mg by mouth every 6 (six) hours as needed for mild  pain.     Marland Kitchen albuterol (VENTOLIN HFA) 108 (90 Base) MCG/ACT inhaler Inhale 1-2 puffs into the lungs every 6 (six) hours as needed for wheezing or shortness of breath.    . ALPRAZolam (XANAX) 0.5 MG tablet Take 0.5 mg by mouth as needed.    Marland Kitchen amLODipine (NORVASC) 2.5 MG tablet Take 1 tablet (2.5 mg total) by mouth daily. 90 tablet 2  . ascorbic acid (VITAMIN C) 500 MG tablet Take 500 mg by mouth daily.    Marland Kitchen AUVI-Q 0.3 MG/0.3ML SOAJ injection Inject 0.3 mLs (0.3 mg total) into the muscle as needed for anaphylaxis. 2 Device 1  . cholecalciferol (VITAMIN D3) 25 MCG (1000 UNIT) tablet Take 1,000 Units by mouth daily.    Marland Kitchen FLUoxetine (PROZAC) 10 MG tablet Take 20 mg by mouth daily.     . fluticasone (FLONASE) 50 MCG/ACT nasal spray fluticasone propionate 50 mcg/actuation nasal spray,suspension    . furosemide (LASIX) 20 MG tablet Take 1 tablet (20 mg total) by mouth daily as needed for fluid or edema. 30 tablet 0  . guaiFENesin (MUCINEX) 600 MG 12 hr tablet Take by mouth as needed.     . hydrocortisone (ANUSOL-HC) 25 MG suppository Place 1 suppository (25 mg total) rectally at bedtime. 12 suppository 1  . ipratropium-albuterol (DUONEB) 0.5-2.5 (3) MG/3ML SOLN Take 3 mLs by nebulization every 4 (four) hours as needed. 3 mL 1  . Multiple Vitamin (MULTIVITAMIN) capsule Take  1 capsule by mouth daily.     . psyllium (METAMUCIL) 58.6 % powder Take 1 packet by mouth daily.     . SYMBICORT 160-4.5 MCG/ACT inhaler Inhale 2 puffs into the lungs 2 (two) times daily. 1 Inhaler 5   No current facility-administered medications for this visit.    Allergies  Allergen Reactions  . Codeine Other (See Comments)    Other reaction(s): Other (See Comments) Pounding in head  Pounding in head   . Amoxicillin     Diarrhea and vomiting.  Has patient had a PCN reaction causing immediate rash, facial/tongue/throat swelling, SOB or lightheadedness with hypotension: No Has patient had a PCN reaction causing severe rash  involving mucus membranes or skin necrosis: No Has patient had a PCN reaction that required hospitalization No Has patient had a PCN reaction occurring within the last 10 years: No If all of the above answers are "NO", then may proceed with Cephalosporin use.   . Augmentin [Amoxicillin-Pot Clavulanate]     Diarrhea and vomiting.   . Bee Venom     Swelling  . Elemental Sulfur   . Latex Hives  . Metoprolol Other (See Comments)    Hair loss  . Milk-Related Compounds     Diarrhea & gas  . Shellfish Allergy     HIVES, SWELLING, N/V & DIARREHA  . Sulfa Antibiotics     Social History   Socioeconomic History  . Marital status: Single    Spouse name: Not on file  . Number of children: 0  . Years of education: Not on file  . Highest education level: Not on file  Occupational History  . Occupation: Retired Chartered loss adjuster  Tobacco Use  . Smoking status: Never Smoker  . Smokeless tobacco: Never Used  Vaping Use  . Vaping Use: Never used  Substance and Sexual Activity  . Alcohol use: No    Alcohol/week: 0.0 standard drinks  . Drug use: No  . Sexual activity: Not Currently  Other Topics Concern  . Not on file  Social History Narrative   Single woman, who lives alone. 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   She does not smoke or drink alcohol.   She occasionally exercises walking on a treadmill.   Social Determinants of Health   Financial Resource Strain: Not on file  Food Insecurity: Not on file  Transportation Needs: Not on file  Physical Activity: Not on file  Stress: Not on file  Social Connections: Not on file  Intimate Partner Violence: Not on file    Family History  Problem Relation Age of Onset  . Hypertension Mother   . Asthma Mother   . Anemia Mother   . Hypertension Father   . Stroke Father   . Stroke Maternal Grandmother   . Diabetes Maternal  Grandmother   . Cancer Maternal Grandfather   . Diabetes Paternal Grandmother   . Heart attack Paternal Grandfather   . Cancer Paternal Grandfather   . Diabetes Paternal Grandfather   . Atrial fibrillation Brother   . Stroke Brother   . Hypertension Other   . Hypertension Sister   . Hypertension Brother     Review of Systems:  As stated in the HPI and otherwise negative.   BP 118/70   Pulse 71   Ht 5\' 1"  (1.549 m)   Wt 152 lb (68.9 kg)   LMP 11/16/2009  SpO2 99%   BMI 28.72 kg/m   Physical Examination:  General: Well developed, well nourished, NAD  HEENT: OP clear, mucus membranes moist  SKIN: warm, dry. No rashes. Neuro: No focal deficits  Musculoskeletal: Muscle strength 5/5 all ext  Psychiatric: Mood and affect normal  Neck: No JVD, no carotid bruits, no thyromegaly, no lymphadenopathy.  Lungs:Clear bilaterally, no wheezes, rhonci, crackles Cardiovascular: Regular rate and rhythm. No murmurs, gallops or rubs. Abdomen:Soft. Bowel sounds present. Non-tender.  Extremities: No lower extremity edema. Pulses are 2 + in the bilateral DP/PT.  Cardiac cath 03/18/13: Left main: No obstructive disease.  Left Anterior Descending Artery: Large caliber vessel that courses to the apex. There are several small caliber diagonal branches. No obstructive disease.  Circumflex Artery: Large caliber vessel with large obtuse marginal branch. No obstructive disease.  Right Coronary Artery: Large dominant vessel with no obstructive disease.  Left Ventricular Angiogram: LVEF=65%.  Impression:  1. No angiographic evidence of CAD  2. Normal LV systolic function  3. Chest pain, cannot fully exclude coronary vasospasm with ST depression on treadmill exercise test.   EKG:  EKG is not ordered today. The ekg ordered today demonstrates  EKG from June 2021 reviewed by me shows sinus  Recent Labs: 02/26/2020: BUN 16; Creatinine, Ser 0.78; Potassium 3.5; Sodium 138   Lipid Panel No results  found for: CHOL, TRIG, HDL, CHOLHDL, VLDL, LDLCALC, LDLDIRECT   Wt Readings from Last 3 Encounters:  06/15/20 152 lb (68.9 kg)  02/26/20 154 lb (69.9 kg)  10/15/19 157 lb (71.2 kg)     Other studies Reviewed: Additional studies/ records that were reviewed today include: . 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 No recent chest pain. Continue Norvasc.  We have discussed an echo today and she will call back to arrange.   2. HTN: BP is controlled. No changes today  3. Lower extremity edema: No volume overload on exam. Lasix prn  Current medicines are reviewed at length with the patient today.  The patient does not have concerns regarding medicines.  The following changes have been made:  no change  Labs/ tests ordered today include:   No orders of the defined types were placed in this encounter.   Disposition:   FU with me in 12  months  Signed, Verne Carrow, MD 06/15/2020 9:31 AM    [facility] [address] [place], [place], [place]  [address] Phone: [phone]; Fax: [phone]

## 2020-06-15 NOTE — Patient Instructions (Signed)
Medication Instructions:  No changaes *If you need a refill on your cardiac medications before your next appointment, please call your pharmacy*   Lab Work: none   Testing/Procedures: PLEASE CALL TO SCHEDULE ECHO Your physician has requested that you have an echocardiogram. Echocardiography is a painless test that uses sound waves to create images of your heart. It provides your doctor with information about the size and shape of your heart and how well your heart's chambers and valves are working. This procedure takes approximately one hour. There are no restrictions for this procedure.   Follow-Up: At [facility], you and your health needs are our priority.  As part of our continuing mission to provide you with exceptional heart care, we have created designated Provider Care Teams.  These Care Teams include your primary Cardiologist (physician) and Advanced Practice Providers (APPs -  Physician Assistants and Nurse Practitioners) who all work together to provide you with the care you need, when you need it.   Your next appointment:   12 month(s)  The format for your next appointment:   In Person  Provider:   You may see Verne Carrow, MD or one of the following Advanced Practice Providers on your designated Care Team:    Ronie Spies, PA-C  Jacolyn Reedy, PA-C   Other Instructions

## 2020-06-15 NOTE — Telephone Encounter (Signed)
I called to schedule echocardiogram that Dr. Lenna Gilford and patient did not want to schedule at this time.  She state she will call us back when she is ready to schedule. Order will be removed from WQ and when pt calls back we can reinstate the order.

## 2020-07-06 ENCOUNTER — Encounter: Payer: Self-pay | Admitting: *Deleted

## 2020-07-06 ENCOUNTER — Ambulatory Visit: Payer: BC Managed Care – PPO | Admitting: Cardiovascular Disease

## 2020-07-06 ENCOUNTER — Ambulatory Visit (INDEPENDENT_AMBULATORY_CARE_PROVIDER_SITE_OTHER): Payer: BC Managed Care – PPO

## 2020-07-06 ENCOUNTER — Encounter: Payer: Self-pay | Admitting: Cardiovascular Disease

## 2020-07-06 ENCOUNTER — Other Ambulatory Visit: Payer: Self-pay

## 2020-07-06 VITALS — BP 120/78 | HR 70 | Ht 61.0 in | Wt 155.6 lb

## 2020-07-06 DIAGNOSIS — R002 Palpitations: Secondary | ICD-10-CM

## 2020-07-06 DIAGNOSIS — I201 Angina pectoris with documented spasm: Secondary | ICD-10-CM

## 2020-07-06 DIAGNOSIS — I10 Essential (primary) hypertension: Secondary | ICD-10-CM | POA: Diagnosis not present

## 2020-07-06 NOTE — Progress Notes (Signed)
Patient ID: Bianca [name], female   DOB: 09-28-1955, 65 y.o.   MRN: [id number] Patient enrolled for [employer] to ship a 3 day ZIO XT long term holter monitor to her home.

## 2020-07-06 NOTE — Progress Notes (Signed)
r   Chief Complaint  Patient presents with  . Follow-up    Palpitations     History of Present Illness: 65 yo female with history of chest pain, HTN, asthma, anxiety and suspicion for coronary vasospasm who is here today for cardiac follow up. She has had chronic chest pain with a normal cardiac cath in 2012 and again in 2014. 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 Echo January 2012 with normal LV function, no valve issues. She has been on Norvasc and has tolerated this well. I saw her in March 2019 and she c/o mild edema. I ordered an echo and started Lasix. She cancelled the echo. I saw her 06/15/20 and she was doing well with occasional LE edema. Echo was planned but not yet completed.   She is here today for follow up. The patient denies any chest pain, dyspnea, lower extremity edema, orthopnea, PND, dizziness, near syncope or syncope. She has been feeling fluttering in her chest for the past few weeks. No associated dizziness and no prolonged episodes of tachycardia.    Primary Care Physician: Tally Joe, MD  Past Medical History:  Diagnosis Date  . Allergy   . Anemia   . Anxiety   . Asthma   . H/O exercise stress test 2008; October 2014   was normal in 2008; positive for ischemia with inferior and lateral ST depressions October 2014  . Hx of echocardiogram 091/2012   relatively normal as well. No significant valvar lesions. Normal Ef.  . Hypertension   . Migraines   . Palpitations     Past Surgical History:  Procedure Laterality Date  . ABDOMINAL EXPLORATION SURGERY  1980  . CARDIAC CATHETERIZATION  04/2010   with no evidence of ischemia or significant coronary disease to speak of, normal LV function with relatively normal EDP.  Marland Kitchen LEFT HEART CATHETERIZATION WITH CORONARY ANGIOGRAM N/A 03/18/2013   Procedure: LEFT HEART CATHETERIZATION WITH CORONARY ANGIOGRAM;  Surgeon: Kathleene Hazel, MD;  Location: [facility] CATH LAB;  Service: Cardiovascular;  Laterality: N/A;  . NASAL  SINUS SURGERY      Current Outpatient Medications  Medication Sig Dispense Refill  . acetaminophen (TYLENOL) 325 MG tablet Take 500 mg by mouth every 6 (six) hours as needed for mild pain.     Marland Kitchen albuterol (VENTOLIN HFA) 108 (90 Base) MCG/ACT inhaler Inhale 1-2 puffs into the lungs every 6 (six) hours as needed for wheezing or shortness of breath.    . ALPRAZolam (XANAX) 0.5 MG tablet Take 0.5 mg by mouth as needed.    Marland Kitchen amLODipine (NORVASC) 2.5 MG tablet Take 1 tablet (2.5 mg total) by mouth daily. 90 tablet 2  . ascorbic acid (VITAMIN C) 500 MG tablet Take 500 mg by mouth daily.    Marland Kitchen AUVI-Q 0.3 MG/0.3ML SOAJ injection Inject 0.3 mLs (0.3 mg total) into the muscle as needed for anaphylaxis. 2 Device 1  . cholecalciferol (VITAMIN D3) 25 MCG (1000 UNIT) tablet Take 1,000 Units by mouth daily.    Marland Kitchen FLUoxetine (PROZAC) 10 MG tablet Take 20 mg by mouth daily.     . fluticasone (FLONASE) 50 MCG/ACT nasal spray fluticasone propionate 50 mcg/actuation nasal spray,suspension    . furosemide (LASIX) 20 MG tablet Take 1 tablet (20 mg total) by mouth daily as needed for fluid or edema. 30 tablet 0  . guaiFENesin (MUCINEX) 600 MG 12 hr tablet Take by mouth as needed.     . hydrocortisone (ANUSOL-HC) 25 MG suppository Place 1 suppository (25 mg total)  rectally at bedtime. 12 suppository 1  . ipratropium-albuterol (DUONEB) 0.5-2.5 (3) MG/3ML SOLN Take 3 mLs by nebulization every 4 (four) hours as needed. 3 mL 1  . Multiple Vitamin (MULTIVITAMIN) capsule Take 1 capsule by mouth daily.     . psyllium (METAMUCIL) 58.6 % powder Take 1 packet by mouth daily.     . SYMBICORT 160-4.5 MCG/ACT inhaler Inhale 2 puffs into the lungs 2 (two) times daily. 1 Inhaler 5   No current facility-administered medications for this visit.    Allergies  Allergen Reactions  . Codeine Other (See Comments)    Other reaction(s): Other (See Comments) Pounding in head  Pounding in head   . Amoxicillin     Diarrhea and vomiting.   Has patient had a PCN reaction causing immediate rash, facial/tongue/throat swelling, SOB or lightheadedness with hypotension: No Has patient had a PCN reaction causing severe rash involving mucus membranes or skin necrosis: No Has patient had a PCN reaction that required hospitalization No Has patient had a PCN reaction occurring within the last 10 years: No If all of the above answers are "NO", then may proceed with Cephalosporin use.   . Augmentin [Amoxicillin-Pot Clavulanate]     Diarrhea and vomiting.   . Bee Venom     Swelling  . Elemental Sulfur   . Latex Hives  . Metoprolol Other (See Comments)    Hair loss  . Milk-Related Compounds     Diarrhea & gas  . Shellfish Allergy     HIVES, SWELLING, N/V & DIARREHA  . Sulfa Antibiotics     Social History   Socioeconomic History  . Marital status: Single    Spouse name: Not on file  . Number of children: 0  . Years of education: Not on file  . Highest education level: Not on file  Occupational History  . Occupation: Retired Chartered loss adjuster  Tobacco Use  . Smoking status: Never Smoker  . Smokeless tobacco: Never Used  Vaping Use  . Vaping Use: Never used  Substance and Sexual Activity  . Alcohol use: No    Alcohol/week: 0.0 standard drinks  . Drug use: No  . Sexual activity: Not Currently  Other Topics Concern  . Not on file  Social History Narrative   Single woman, who lives alone. 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   She does not smoke or drink alcohol.   She occasionally exercises walking on a treadmill.   Social Determinants of Health   Financial Resource Strain: Not on file  Food Insecurity: Not on file  Transportation Needs: Not on file  Physical Activity: Not on file  Stress: Not on file  Social Connections: Not on file  Intimate Partner Violence: Not on file    Family History  Problem  Relation Age of Onset  . Hypertension Mother   . Asthma Mother   . Anemia Mother   . Hypertension Father   . Stroke Father   . Stroke Maternal Grandmother   . Diabetes Maternal Grandmother   . Cancer Maternal Grandfather   . Diabetes Paternal Grandmother   . Heart attack Paternal Grandfather   . Cancer Paternal Grandfather   . Diabetes Paternal Grandfather   . Atrial fibrillation Brother   . Stroke Brother   . Hypertension Other   . Hypertension Sister   . Hypertension Brother     Review of Systems:  As stated in the HPI and otherwise negative.   BP 120/78   Pulse 70   Ht 5\' 1"  (1.549 m)   Wt 155 lb 9.6 oz (70.6 kg)   LMP 11/16/2009   SpO2 99%   BMI 29.40 kg/m   Physical Examination:  General: Well developed, well nourished, NAD  HEENT: OP clear, mucus membranes moist  SKIN: warm, dry. No rashes. Neuro: No focal deficits  Musculoskeletal: Muscle strength 5/5 all ext  Psychiatric: Mood and affect normal  Neck: No JVD, no carotid bruits, no thyromegaly, no lymphadenopathy.  Lungs:Clear bilaterally, no wheezes, rhonci, crackles Cardiovascular: Regular rate and rhythm. No murmurs, gallops or rubs. Abdomen:Soft. Bowel sounds present. Non-tender.  Extremities: No lower extremity edema. Pulses are 2 + in the bilateral DP/PT.  Cardiac cath 03/18/13: Left main: No obstructive disease.  Left Anterior Descending Artery: Large caliber vessel that courses to the apex. There are several small caliber diagonal branches. No obstructive disease.  Circumflex Artery: Large caliber vessel with large obtuse marginal branch. No obstructive disease.  Right Coronary Artery: Large dominant vessel with no obstructive disease.  Left Ventricular Angiogram: LVEF=65%.  Impression:  1. No angiographic evidence of CAD  2. Normal LV systolic function  3. Chest pain, cannot fully exclude coronary vasospasm with ST depression on treadmill exercise test.   EKG:  EKG is ordered today. The ekg  ordered today demonstrates sinus with PAC  Recent Labs: 02/26/2020: BUN 16; Creatinine, Ser 0.78; Potassium 3.5; Sodium 138   Lipid Panel No results found for: CHOL, TRIG, HDL, CHOLHDL, VLDL, LDLCALC, LDLDIRECT   Wt Readings from Last 3 Encounters:  07/06/20 155 lb 9.6 oz (70.6 kg)  06/15/20 152 lb (68.9 kg)  02/26/20 154 lb (69.9 kg)     Other studies Reviewed: Additional studies/ records that were reviewed today include: . 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 No chest pain. Will continue Norvasc.   2. HTN: BP is well controlled. No changes today  3. Lower extremity edema: No LE edema. Lasix prn.   4. Palpitations/PACs: PACs noted on EKG today. Will arrange a 3 day Zio patch cardiac monitor to exclude other arrhythmias. Echo to assess LVEF and exclude structural heart disease.   Current medicines are reviewed at length with the patient today.  The patient does not have concerns regarding medicines.  The following changes have been made:  no change  Labs/ tests ordered today include:   Orders Placed This Encounter  Procedures  . LONG TERM MONITOR (3-14 DAYS)  . EKG 12-Lead  . ECHOCARDIOGRAM COMPLETE    Disposition:   FU with me in 12  months  Signed, January 2015, MD 07/06/2020 4:29 PM    [facility] [address] [place], [place], [place]  [place] Phone: [phone]; Fax: [phone]

## 2020-07-06 NOTE — Patient Instructions (Addendum)
Medication Instructions:  Your physician recommends that you continue on your current medications as directed. Please refer to the Current Medication list given to you today.  *If you need a refill on your cardiac medications before your next appointment, please call your pharmacy*   Lab Work: none If you have labs (blood work) drawn today and your tests are completely normal, you will receive your results only by: Marland Kitchen MyChart Message (if you have MyChart) OR . A paper copy in the mail If you have any lab test that is abnormal or we need to change your treatment, we will call you to review the results.   Testing/Procedures: Your physician has requested that you have an echocardiogram. Echocardiography is a painless test that uses sound waves to create images of your heart. It provides your doctor with information about the size and shape of your heart and how well your heart's chambers and valves are working. This procedure takes approximately one hour. There are no restrictions for this procedure.  ZIO XT- Long Term Monitor Instructions   Your physician has requested you wear your ZIO patch monitor 3 days.   This is a single patch monitor.  [employer] supplies one patch monitor per enrollment.  Additional stickers are not available.   Please do not apply patch if you will be having a Nuclear Stress Test, Echocardiogram, Cardiac CT, MRI, or Chest Xray during the time frame you would be wearing the monitor. The patch cannot be worn during these tests.  You cannot remove and re-apply the ZIO XT patch monitor.   Your ZIO patch monitor will be sent USPS Priority mail from [facility] directly to your home address. The monitor may also be mailed to a PO BOX if home delivery is not available.   It may take 3-5 days to receive your monitor after you have been enrolled.   Once you have received you monitor, please review enclosed instructions.  Your monitor has already been registered  assigning a specific monitor serial # to you.   Applying the monitor   Shave hair from upper left chest.   Hold abrader disc by orange tab.  Rub abrader in 40 strokes over left upper chest as indicated in your monitor instructions.   Clean area with 4 enclosed alcohol pads .  Use all pads to assure are is cleaned thoroughly.  Let dry.   Apply patch as indicated in monitor instructions.  Patch will be place under collarbone on left side of chest with arrow pointing upward.   Rub patch adhesive wings for 2 minutes.Remove white label marked "1".  Remove white label marked "2".  Rub patch adhesive wings for 2 additional minutes.   While looking in a mirror, press and release button in center of patch.  A small green light will flash 3-4 times .  This will be your only indicator the monitor has been turned on.     Do not shower for the first 24 hours.  You may shower after the first 24 hours.   Press button if you feel a symptom. You will hear a small click.  Record Date, Time and Symptom in the Patient Log Book.   When you are ready to remove patch, follow instructions on last 2 pages of Patient Log Book.  Stick patch monitor onto last page of Patient Log Book.   Place Patient Log Book in Big Arm box.  Use locking tab on box and tape box closed securely.  The [place] and  White box has [employer] on it.  Please place in mailbox as soon as possible.  Your physician should have your test results approximately 7 days after the monitor has been mailed back to [facility].   Call [facility] Customer Care at [phone] if you have questions regarding your ZIO XT patch monitor.  Call them immediately if you see an orange light blinking on your monitor.   If your monitor falls off in less than 4 days contact our Monitor department at [phone].  If your monitor becomes loose or falls off after 4 days call [employer] at [phone] for suggestions on securing your monitor.        Follow-Up: At [facility], you and your health needs are our priority.  As part of our continuing mission to provide you with exceptional heart care, we have created designated Provider Care Teams.  These Care Teams include your primary Cardiologist (physician) and Advanced Practice Providers (APPs -  Physician Assistants and Nurse Practitioners) who all work together to provide you with the care you need, when you need it.  We recommend signing up for the patient portal called "MyChart".  Sign up information is provided on this After Visit Summary.  MyChart is used to connect with patients for Virtual Visits (Telemedicine).  Patients are able to view lab/test results, encounter notes, upcoming appointments, etc.  Non-urgent messages can be sent to your provider as well.   To learn more about what you can do with MyChart, go to [email or web address].    Your next appointment:   10 week(s)  The format for your next appointment:   In Person  Provider:   Verne Carrow, MD   Other Instructions

## 2020-07-07 ENCOUNTER — Telehealth: Payer: Self-pay | Admitting: Cardiovascular Disease

## 2020-07-07 NOTE — Telephone Encounter (Signed)
Patient requesting a heart patch go ahead and be sent for her.

## 2020-07-11 DIAGNOSIS — I201 Angina pectoris with documented spasm: Secondary | ICD-10-CM

## 2020-07-11 DIAGNOSIS — I10 Essential (primary) hypertension: Secondary | ICD-10-CM

## 2020-07-11 DIAGNOSIS — R002 Palpitations: Secondary | ICD-10-CM

## 2020-07-14 ENCOUNTER — Telehealth (HOSPITAL_COMMUNITY): Payer: Self-pay | Admitting: Cardiovascular Disease

## 2020-07-14 NOTE — Telephone Encounter (Signed)
I called patient to follow up on scheduling her echocardiogram and due to financial reasons she wil lnot be able to schedule at this time. Patient will call back when she is able to. Order will be removed from the ECHO WQ.

## 2020-07-14 NOTE — Telephone Encounter (Signed)
Will route to [employer] street office-

## 2020-08-11 IMAGING — CR CHEST - 2 VIEW
1 series · 1 of 1 positions shown · non-contrast
Comparison: 10/15/2015.

CLINICAL DATA: Dry cough.

EXAM:
CHEST - 2 VIEW

[w chest pa]
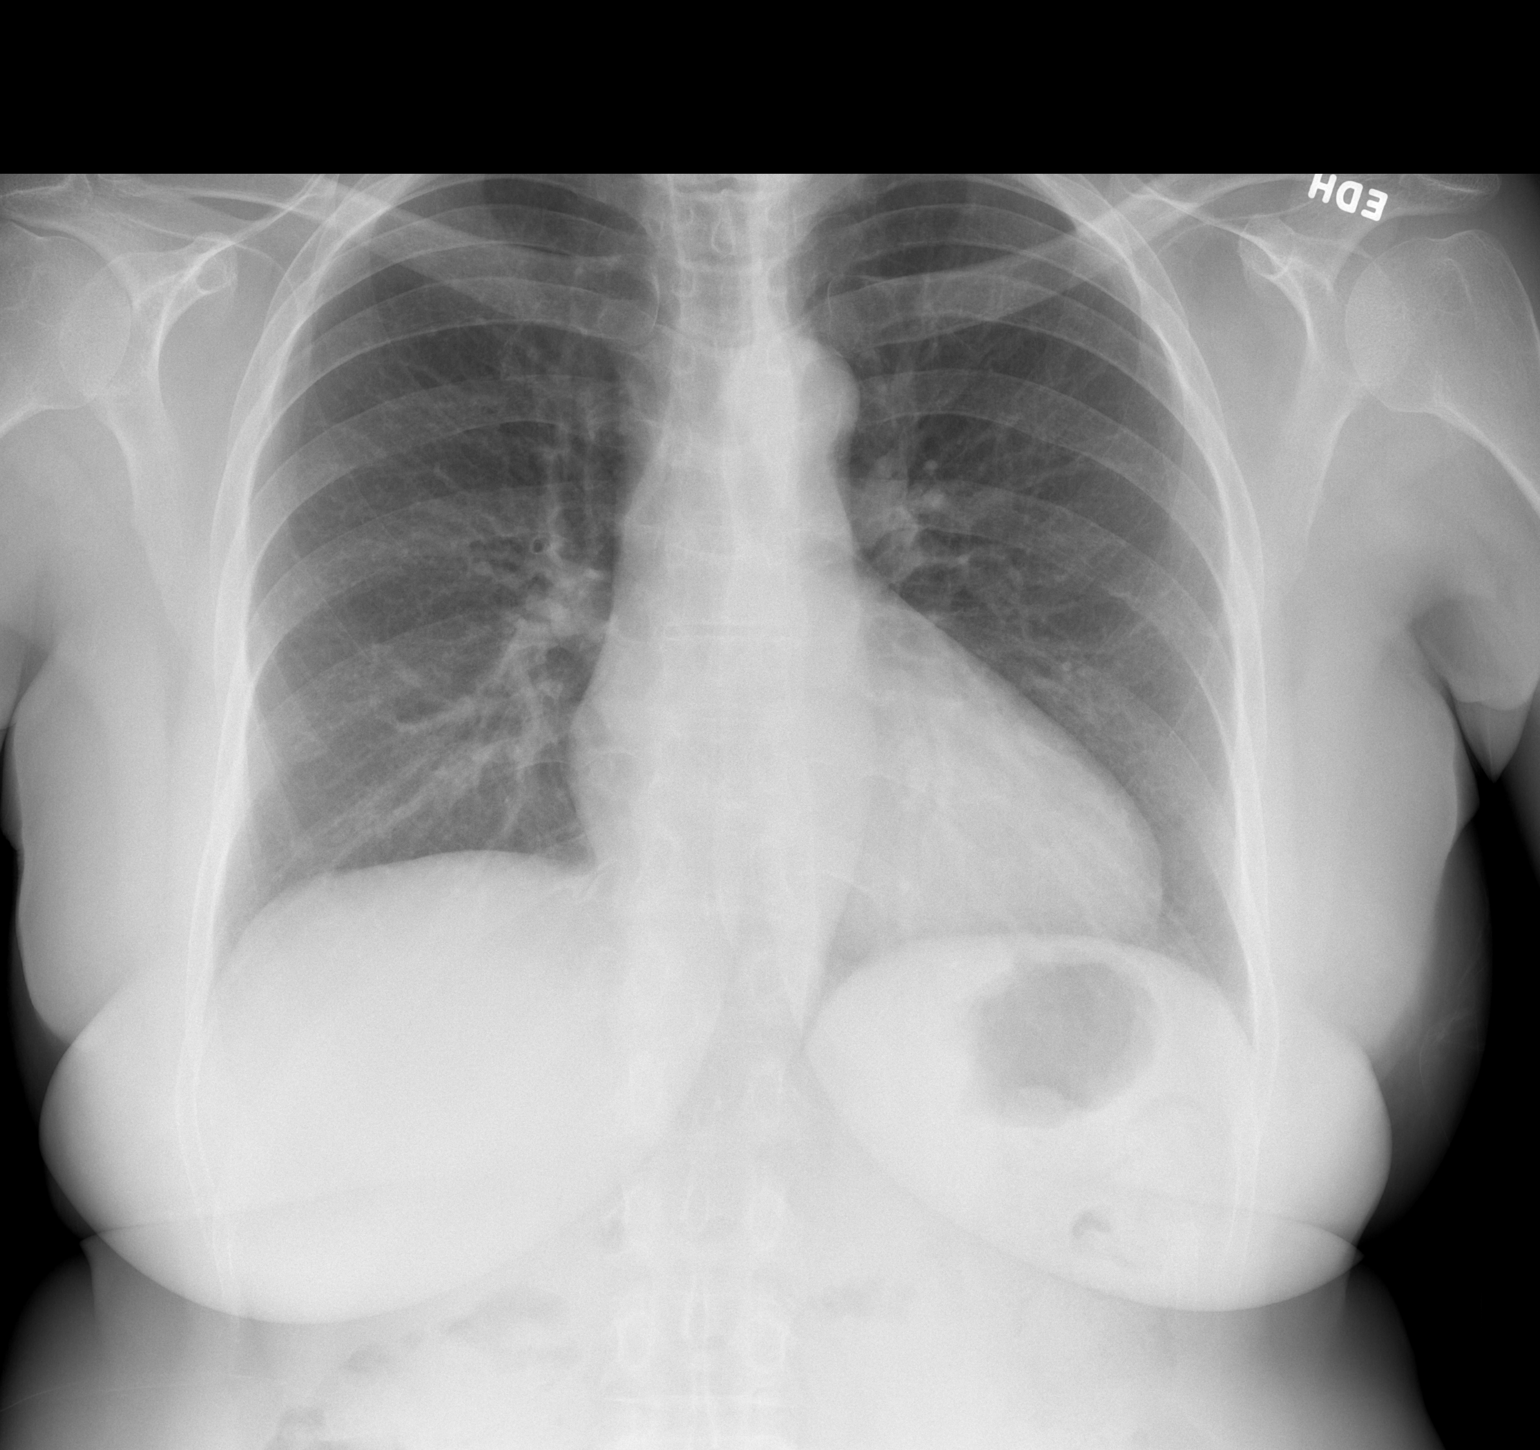

[1 of 1 positions shown; findings below may reference images not displayed]

FINDINGS: Stable cardiomegaly. No pulmonary venous congestion. No acute
infiltrate. No pleural effusion or pneumothorax. No acute bony
abnormality.
IMPRESSION: 1.  Stable cardiomegaly.  No pulmonary venous congestion.

2.  No acute infiltrate.

## 2020-12-02 ENCOUNTER — Other Ambulatory Visit: Payer: Self-pay | Admitting: Cardiovascular Disease

## 2020-12-03 ENCOUNTER — Telehealth: Payer: Self-pay | Admitting: Cardiovascular Disease

## 2020-12-03 NOTE — Telephone Encounter (Signed)
*  STAT* If patient is at the pharmacy, call can be transferred to refill team.   1. Which medications need to be refilled? (please list name of each medication and dose if known) need a new prescription for Amlodipine- directions changed to 2 a day instead of 1 per day  2. Which pharmacy/location (including street and city if local pharmacy) is medication to be sent to? [facility] [address] [place]  3. Do they need a 30 day or 90 day supply? 90 days and refills

## 2020-12-04 NOTE — Telephone Encounter (Signed)
Pt states that she needs her Amlodipine prescription changed from 1 tablet daily to 1 tablet twice a day, pt was seen by Edd Fabian, NP on 10/15/19 and he decreased prescription to 2.5 mg daily due to controlled blood pressure. I told patient that we cannot change her prescription without approval from physician. Pt states that she does not take the prescription twice a day on the daily basis but would like enough to use extra if she needs it. I told her that I would route it to the nurse to make sure its okay to change prescription.

## 2020-12-14 NOTE — Telephone Encounter (Signed)
Pt was started on amlodipine for vasospasm.  Was taking 2.5 mg BID. Pt reduced to daily because of symptoms of lightheadedness, dizziness and had a 10 pound weight loss.  She was last seen in 06/2020 and was instructed to continue daily dose at that time.  Per documentation below, there are days when she takes one extra tablet in the PM.  Will route to Dr. Clifton James for review and if ok will send updated prescription to take one extra tablet daily as needed for BP.

## 2020-12-16 MED ORDER — AMLODIPINE BESYLATE 2.5 MG PO TABS
2.5000 mg | ORAL_TABLET | Freq: Every day | ORAL | 1 refills | Status: DC
Start: 2020-12-16 — End: 2021-02-19

## 2020-12-16 NOTE — Addendum Note (Signed)
Addended by: Lendon Ka on: 12/16/2020 10:25 AM   Modules accepted: Orders

## 2021-02-19 ENCOUNTER — Telehealth: Payer: Self-pay | Admitting: Cardiovascular Disease

## 2021-02-19 MED ORDER — AMLODIPINE BESYLATE 2.5 MG PO TABS: 2.5000 mg | ORAL_TABLET | Freq: Two times a day (BID) | ORAL | 3 refills | Status: DC

## 2021-02-19 NOTE — Telephone Encounter (Signed)
Refilled amlodipine for the 2.5 mg tablet twice daily dose.  I called the patient.  She has been taking it this way and it is keeping her BP 120/80s.  Will route to Dr. Clifton James to let him know.  Her previous prescription was for daily and then one extra tablet as needed.

## 2021-02-19 NOTE — Telephone Encounter (Signed)
Pt c/o medication issue:  1. Name of Medication: amLODipine (NORVASC) 2.5 MG tablet  2. How are you currently taking this medication (dosage and times per day)? Patient taking two pills daily   3. Are you having a reaction (difficulty breathing--STAT)?   4. What is your medication issue? Patient is taking two pills daily. She needs a new rx sent to her local pharmacy to reflect that. If she goes to pick up a refill with the current dosage instructions her insurance will tell her that it is too soon to pick it up. Please send a new rx to [id number] - [place], [place] - [address] AT [facility] OF [address]

## 2021-03-19 ENCOUNTER — Other Ambulatory Visit (INDEPENDENT_AMBULATORY_CARE_PROVIDER_SITE_OTHER): Payer: BC Managed Care – PPO

## 2021-03-19 ENCOUNTER — Other Ambulatory Visit: Payer: Self-pay

## 2021-03-19 ENCOUNTER — Ambulatory Visit (INDEPENDENT_AMBULATORY_CARE_PROVIDER_SITE_OTHER)
Admission: RE | Admit: 2021-03-19 | Discharge: 2021-03-19 | Disposition: A | Discharge status: Home / Self Care | Payer: BC Managed Care – PPO | Source: Ambulatory Visit | Attending: Gastroenterology | Admitting: Gastroenterology

## 2021-03-19 ENCOUNTER — Encounter: Payer: Self-pay | Admitting: Gastroenterology

## 2021-03-19 ENCOUNTER — Ambulatory Visit: Payer: BC Managed Care – PPO | Admitting: Gastroenterology

## 2021-03-19 ENCOUNTER — Other Ambulatory Visit: Payer: Self-pay | Admitting: Gastroenterology

## 2021-03-19 VITALS — BP 104/70 | HR 62 | Ht 61.0 in | Wt 160.5 lb

## 2021-03-19 DIAGNOSIS — R109 Unspecified abdominal pain: Secondary | ICD-10-CM

## 2021-03-19 DIAGNOSIS — R1031 Right lower quadrant pain: Secondary | ICD-10-CM

## 2021-03-19 DIAGNOSIS — K649 Unspecified hemorrhoids: Secondary | ICD-10-CM

## 2021-03-19 DIAGNOSIS — K6289 Other specified diseases of anus and rectum: Secondary | ICD-10-CM

## 2021-03-19 DIAGNOSIS — R194 Change in bowel habit: Secondary | ICD-10-CM

## 2021-03-19 DIAGNOSIS — M549 Dorsalgia, unspecified: Secondary | ICD-10-CM

## 2021-03-19 DIAGNOSIS — M546 Pain in thoracic spine: Secondary | ICD-10-CM

## 2021-03-19 LAB — AMYLASE: Amylase: 92 U/L (ref 27–131)

## 2021-03-19 LAB — COMPREHENSIVE METABOLIC PANEL
ALT: 20 U/L (ref 0–35)
AST: 29 U/L (ref 0–37)
Albumin: 4.4 g/dL (ref 3.5–5.2)
Alkaline Phosphatase: 82 U/L (ref 39–117)
BUN: 15 mg/dL (ref 6–23)
CO2: 31 mEq/L (ref 19–32)
Calcium: 10.2 mg/dL (ref 8.4–10.5)
Chloride: 104 mEq/L (ref 96–112)
Creatinine, Ser: 0.73 mg/dL (ref 0.40–1.20)
GFR: 86.71 mL/min (ref >60.00)
Glucose, Bld: 87 mg/dL (ref 70–99)
Potassium: 3.8 mEq/L (ref 3.5–5.1)
Sodium: 141 mEq/L (ref 135–145)
Total Bilirubin: 0.7 mg/dL (ref 0.2–1.2)
Total Protein: 7.6 g/dL (ref 6.0–8.3)

## 2021-03-19 LAB — URINALYSIS WITH CULTURE, IF INDICATED
Bilirubin Urine: NEGATIVE
Ketones, ur: NEGATIVE
Leukocytes,Ua: NEGATIVE
Nitrite: NEGATIVE
Specific Gravity, Urine: 1.025 (ref 1.000–1.030)
Total Protein, Urine: NEGATIVE
Urine Glucose: NEGATIVE
Urobilinogen, UA: 0.2 (ref 0.0–1.0)
pH: 5.5 (ref 5.0–8.0)

## 2021-03-19 LAB — PROTIME-INR
INR: 1.1 ratio — ABNORMAL HIGH (ref 0.8–1.0)
Prothrombin Time: 12.3 s (ref 9.6–13.1)

## 2021-03-19 LAB — CBC
HCT: 37.6 % (ref 36.0–46.0)
Hemoglobin: 13.2 g/dL (ref 12.0–15.0)
MCHC: 35 g/dL (ref 30.0–36.0)
MCV: 82.8 fl (ref 78.0–100.0)
Platelets: 328 10*3/uL (ref 150.0–400.0)
RBC: 4.55 Mil/uL (ref 3.87–5.11)
RDW: 14.2 % (ref 11.5–15.5)
WBC: 6 10*3/uL (ref 4.0–10.5)

## 2021-03-19 LAB — LIPASE: Lipase: 78 U/L — ABNORMAL HIGH (ref 11.0–59.0)

## 2021-03-19 LAB — TSH: TSH: 0.68 u[IU]/mL (ref 0.35–5.50)

## 2021-03-19 MED ORDER — HYDROCORTISONE ACETATE 25 MG RE SUPP: 25.0000 mg | Freq: Every evening | RECTAL | 0 refills | Status: DC

## 2021-03-19 NOTE — Progress Notes (Signed)
[place] VISIT   Primary Care Provider Antony Contras, MD [address] [place] [address] [phone]  Patient Profile: Bianca [name] is a 65 y.o. female with a pmh significant for anxiety, asthma, hypertension, migraines, hemorrhoids.  The patient presents to the [facility] for an evaluation and management of problem(s) noted below:  Problem List 1. Acute midline thoracic back pain   2. Right flank pain   3. RLQ abdominal pain   4. Change in bowel habits   5. Rectal pain   6. Hemorrhoids, unspecified hemorrhoid type     History of Present Illness Please see prior notes by PA Esterwood for full details of HPI.  Interval History The patient was last seen in 2021 by PA Misenheimer.  The patient had previously undergone evaluation and management by her gastroenterologist Dr. Earlean Shawl.  2021 she presented with some rectal discomfort and mild constipation and some discomfort over the right flank.  Patient was planned for Anusol suppositories and to schedule a CT of the abdomen/pelvis.  The patient did not recall the need for the CT abdomen/pelvis and her symptoms improved regarding her rectal discomfort.  When asking if the symptoms of her right flank/right back/right lower quadrant pain are similar to what she had before she states it is possible but she cannot recall completely.  Patient has had newer onset discomfort in the right flank and in the back over the course of the last few weeks.  This is not exacerbated by eating or fasting.  Bowel habits are not significantly altered currently but she is experiencing more issues of constipation.  2 years ago she would have had more normal/regular bowel movements.  She is still having some rectal discomfort which she feels is likely hemorrhoidal again.  She recalls that Anusol suppositories were helpful last year.  GI Review of Systems Positive as above Negative for pyrosis,  dysphagia, odynophagia, nausea, vomiting, melena, hematochezia  Review of Systems General: Denies fevers/chills/weight loss unintentionally HEENT: Denies oral lesions Cardiovascular: Denies chest pain Pulmonary: Denies shortness of breath Gastroenterological: See HPI Genitourinary: Denies darkened urine Hematological: Denies easy bruising/bleeding Dermatological: Denies jaundice Psychological: Mood is stable   Medications Current Outpatient Medications  Medication Sig Dispense Refill   acetaminophen (TYLENOL) 325 MG tablet Take 500 mg by mouth every 6 (six) hours as needed for mild pain.      albuterol (VENTOLIN HFA) 108 (90 Base) MCG/ACT inhaler Inhale 1-2 puffs into the lungs every 6 (six) hours as needed for wheezing or shortness of breath.     ALPRAZolam (XANAX) 0.5 MG tablet Take 0.5 mg by mouth as needed.     amLODipine (NORVASC) 2.5 MG tablet Take 1 tablet (2.5 mg total) by mouth in the morning and at bedtime. 180 tablet 3   ascorbic acid (VITAMIN C) 500 MG tablet Take 500 mg by mouth daily.     AUVI-Q 0.3 MG/0.3ML SOAJ injection Inject 0.3 mLs (0.3 mg total) into the muscle as needed for anaphylaxis. 2 Device 1   cholecalciferol (VITAMIN D3) 25 MCG (1000 UNIT) tablet Take 1,000 Units by mouth daily.     fluticasone (FLONASE) 50 MCG/ACT nasal spray fluticasone propionate 50 mcg/actuation nasal spray,suspension     guaiFENesin (MUCINEX) 600 MG 12 hr tablet Take by mouth as needed.      ipratropium-albuterol (DUONEB) 0.5-2.5 (3) MG/3ML SOLN Take 3 mLs by nebulization every 4 (four) hours as needed. 3 mL 1   Multiple Vitamin (MULTIVITAMIN) capsule  Take 1 capsule by mouth daily.      psyllium (METAMUCIL) 58.6 % powder Take 1 packet by mouth daily.      SYMBICORT 160-4.5 MCG/ACT inhaler Inhale 2 puffs into the lungs 2 (two) times daily. 1 Inhaler 5   furosemide (LASIX) 20 MG tablet Take 1 tablet (20 mg total) by mouth daily as needed for fluid or edema. 30 tablet 0   hydrocortisone  (ANUSOL-HC) 25 MG suppository Place 1 suppository (25 mg total) rectally at bedtime. 12 suppository 0   No current facility-administered medications for this visit.    Allergies Allergies  Allergen Reactions   Codeine Other (See Comments)    Other reaction(s): Other (See Comments) Pounding in head  Pounding in head    Amoxicillin     Diarrhea and vomiting.  Has patient had a PCN reaction causing immediate rash, facial/tongue/throat swelling, SOB or lightheadedness with hypotension: No Has patient had a PCN reaction causing severe rash involving mucus membranes or skin necrosis: No Has patient had a PCN reaction that required hospitalization No Has patient had a PCN reaction occurring within the last 10 years: No If all of the above answers are "NO", then may proceed with Cephalosporin use.    Augmentin [Amoxicillin-Pot Clavulanate]     Diarrhea and vomiting.    Bee Venom     Swelling   Elemental Sulfur    Latex Hives   Metoprolol Other (See Comments)    Hair loss   Milk-Related Compounds     Diarrhea & gas   Shellfish Allergy     HIVES, SWELLING, N/V & DIARREHA   Sulfa Antibiotics     Histories Past Medical History:  Diagnosis Date   Allergy    Anemia    Anxiety    Asthma    H/O exercise stress test 2008; October 2014   was normal in 2008; positive for ischemia with inferior and lateral ST depressions October 2014   Hx of echocardiogram 091/2012   relatively normal as well. No significant valvar lesions. Normal Ef.   Hypertension    Migraines    Palpitations    Past Surgical History:  Procedure Laterality Date   [place]  04/2010   with no evidence of ischemia or significant coronary disease to speak of, normal LV function with relatively normal EDP.   LEFT HEART CATHETERIZATION WITH CORONARY ANGIOGRAM N/A 03/18/2013   Procedure: LEFT HEART CATHETERIZATION WITH CORONARY ANGIOGRAM;  Surgeon: Burnell Blanks, MD;  Location: [facility] CATH LAB;  Service: Cardiovascular;  Laterality: N/A;   NASAL SINUS SURGERY     Social History   Socioeconomic History   Marital status: Single    Spouse name: Not on file   Number of children: 0   Years of education: Not on file   Highest education level: Not on file  Occupational History   Occupation: Retired Radio producer  Tobacco Use   Smoking status: Never   Smokeless tobacco: Never  Vaping Use   Vaping Use: Never used  Substance and Sexual Activity   Alcohol use: No    Alcohol/week: 0.0 standard drinks   Drug use: No   Sexual activity: Not Currently  Other Topics Concern   Not on file  Social History Narrative   Single woman, who lives alone. 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   She  does not smoke or drink alcohol.   She occasionally exercises walking on a treadmill.   Social Determinants of Health   Financial Resource Strain: Not on file  Food Insecurity: Not on file  Transportation Needs: Not on file  Physical Activity: Not on file  Stress: Not on file  Social Connections: Not on file  Intimate Partner Violence: Not on file   Family History  Problem Relation Age of Onset   Hypertension Mother    Asthma Mother    Anemia Mother    Hypertension Father    Stroke Father    Hypertension Sister    Atrial fibrillation Brother    Stroke Brother    Hypertension Brother    Stroke Maternal Grandmother    Diabetes Maternal Grandmother    Cancer Maternal Grandfather    Diabetes Paternal Grandmother    Heart attack Paternal Grandfather    Cancer Paternal Grandfather    Diabetes Paternal Grandfather    Hypertension Other    Colon cancer Neg Hx    Esophageal cancer Neg Hx    Inflammatory bowel disease Neg Hx    Liver disease Neg Hx    Pancreatic cancer Neg Hx    Rectal cancer Neg Hx    I have reviewed her medical, social,  and family history in detail and updated the electronic medical record as necessary.    PHYSICAL EXAMINATION  BP 104/70   Pulse 62   Ht _0  (1.549 m)   Wt 160 lb 8 oz (72.8 kg)   LMP 11/16/2009   BMI 30.33 kg/m  Wt Readings from Last 3 Encounters:  03/19/21 160 lb 8 oz (72.8 kg)  07/06/20 155 lb 9.6 oz (70.6 kg)  06/15/20 152 lb (68.9 kg)  GEN: NAD, appears stated age, doesn't appear chronically ill PSYCH: Cooperative, without pressured speech EYE: Conjunctivae pink, sclerae anicteric ENT: MMM, without oral ulcers CV: RR without R/Gs  RESP: CTAB posteriorly, without wheezing GI: NABS, soft, TTP in RUQ/RLQ/Rt Flank, without rebound or guarding MSK/EXT: TTP in midline lower thoracic spine; no lower extremity edema SKIN: No jaundice NEURO:  Alert & Oriented x 3, no focal deficits   REVIEW OF DATA  I reviewed the following data at the time of this encounter:  GI Procedures and Studies  Previously reviewed by PA Esterwood with 2016 colonoscopy  Laboratory Studies  Reviewed those in epic  Imaging Studies  2017 CT abdomen pelvis with contrast IMPRESSION: 1. No acute abdominal or pelvic pathology. 2. Distal esophageal wall thickening as can be seen with esophagitis.   ASSESSMENT  Ms. Rayfield is a 65 y.o. female with a pmh significant for anxiety, asthma, hypertension, migraines, hemorrhoids.  The patient is seen today for evaluation and management of:  1. Acute midline thoracic back pain   2. Right flank pain   3. RLQ abdominal pain   4. Change in bowel habits   5. Rectal pain   6. Hemorrhoids, unspecified hemorrhoid type    The patient is hemodynamically stable.  Clinically, she is experiencing abdominal and flank pain of an unclear etiology.  Back pain also seems to be out of proportion.  Patient feels that many of the symptoms are newer in onset although prior note from 2021 had suggested some similar type of symptoms.  We are going to treat her hemorrhoidal disease  with Anusol suppositories again and I think that will be helpful for the rectal discomfort.  In regards to the further work-up of the current issues, I  recommend laboratory evaluation, plain film imaging today to rule out acute issue, and cross-sectional CT imaging.  Endoscopic evaluation may be required from above and below but I wonder rule out any issues in regards to intra-abdominal pathology and pancreas issues although this does not seem to be overt pancreatitis.  Time will tell.  If patient's symptoms progress/worsen before CT scan she will need to go to the emergency department.  All patient questions were answered to the best of my ability, and the patient agrees to the aforementioned plan of action with follow-up as indicated.   PLAN  Laboratories as outlined below KUB to be performed Thoracic/lumbar spine films to be obtained UA with reflex urine culture CT abdomen/pelvis with IV and oral contrast to be performed Anusol suppositories prescription - Nightly x1 week then every other night until done Endoscopic evaluation from above and below may be considered pending evaluation   Orders Placed This Encounter  Procedures   DG Thoracic Spine 2 View   DG Abd 2 Views   CT Abdomen Pelvis W Contrast   CBC   Comp Met (CMET)   Calcium, ionized   Amylase   Lipase   TSH   INR/PT   Urinalysis with Culture, if indicated    New Prescriptions   No medications on file   Modified Medications   Modified Medication Previous Medication   HYDROCORTISONE (ANUSOL-HC) 25 MG SUPPOSITORY hydrocortisone (ANUSOL-HC) 25 MG suppository      Place 1 suppository (25 mg total) rectally at bedtime.    Place 1 suppository (25 mg total) rectally at bedtime.    Planned Follow Up No follow-ups on file.   Total Time in Face-to-Face and in Coordination of Care for patient including independent/personal interpretation/review of prior testing, medical history, examination, medication adjustment,  communicating results with the patient directly, and documentation within the EHR is 30 minutes.   Justice Britain, MD [facility] Office # [phone]

## 2021-03-19 NOTE — Patient Instructions (Addendum)
Your provider has requested that you go to the basement level for lab work before leaving today. Press "B" on the elevator. The lab is located at the first door on the left as you exit the elevator.  Your provider has requested that you have an abdominal x ray, lumbar -spine , and thoracic xray before leaving today. Please go to the basement floor to our Radiology department for the test.  You have been scheduled for a CT scan of the abdomen and pelvis at [facility] ([address]---this is in the same building as [employer]).   You are scheduled on 03/26/21 at 2:30pm. You should arrive 15 minutes prior to your appointment time for registration. Please follow the written instructions below on the day of your exam:  WARNING: IF YOU ARE ALLERGIC TO IODINE/X-RAY DYE, PLEASE NOTIFY RADIOLOGY IMMEDIATELY AT [phone]! YOU WILL BE GIVEN A 13 HOUR PREMEDICATION PREP.  1) Do not eat or drink anything after 10:30am (4 hours prior to your test) 2) You have been given 2 bottles of oral contrast to drink. The solution may taste better if refrigerated, but do NOT add ice or any other liquid to this solution. Shake well before drinking.    Drink 1 bottle of contrast @ 12:30pm (2 hours prior to your exam)  Drink 1 bottle of contrast @ 1:30pm (1 hour prior to your exam)  You may take any medications as prescribed with a small amount of water, if necessary. If you take any of the following medications: METFORMIN, GLUCOPHAGE, GLUCOVANCE, AVANDAMET, RIOMET, FORTAMET, [place] MET, JANUMET, GLUMETZA or METAGLIP, you MAY be asked to HOLD this medication 48 hours AFTER the exam.  The purpose of you drinking the oral contrast is to aid in the visualization of your intestinal tract. The contrast solution may cause some diarrhea. Depending on your individual set of symptoms, you may also receive an intravenous injection of x-ray contrast/dye. Plan on being at [facility] for 30 minutes  or longer, depending on the type of exam you are having performed.  This test typically takes 30-45 minutes to complete.  If you have any questions regarding your exam or if you need to reschedule, you may call the CT department at [phone] between the hours of 8:00 am and 5:00 pm, Monday-Friday.  _______________________________________________________________  We have sent the following medications to your pharmacy for you to pick up at your convenience: Anusol Suppositories- Use 1 suppository rectally a bedtime for 1 week, then use every other night until completion of prescription.   Thank you for choosing me and [facility].  Dr. Rush Landmark

## 2021-03-21 LAB — CALCIUM, IONIZED: Calcium, Ion: 5.39 mg/dL (ref 4.8–5.6)

## 2021-03-22 ENCOUNTER — Telehealth: Payer: Self-pay | Admitting: Gastroenterology

## 2021-03-22 ENCOUNTER — Encounter: Payer: Self-pay | Admitting: Gastroenterology

## 2021-03-22 NOTE — Telephone Encounter (Signed)
Patient called regarding CT scan for this Friday.  She is feeling very dizzy and wanted to know if it could be scheduled sooner.  Please call patient and let her know.  Thank you.

## 2021-03-22 NOTE — Telephone Encounter (Signed)
Returned call to patient. [place] [facility] does not have anything any sooner than Friday for a CT scan. Informed patient that if she continues to feel bad then she may go to ER or Urgent care. Patient also advised that she could call  CT later in the week and see if they have had any cancellization before Friday. Pt verbalized understanding.

## 2021-03-22 NOTE — Telephone Encounter (Signed)
Bonita Quin this pt was seen in the office. I will send to [place].

## 2021-03-23 ENCOUNTER — Telehealth: Payer: Self-pay | Admitting: Gastroenterology

## 2021-03-23 DIAGNOSIS — K649 Unspecified hemorrhoids: Secondary | ICD-10-CM | POA: Insufficient documentation

## 2021-03-23 DIAGNOSIS — R109 Unspecified abdominal pain: Secondary | ICD-10-CM | POA: Insufficient documentation

## 2021-03-23 DIAGNOSIS — K6289 Other specified diseases of anus and rectum: Secondary | ICD-10-CM | POA: Insufficient documentation

## 2021-03-23 DIAGNOSIS — R194 Change in bowel habit: Secondary | ICD-10-CM | POA: Insufficient documentation

## 2021-03-23 DIAGNOSIS — R1031 Right lower quadrant pain: Secondary | ICD-10-CM | POA: Insufficient documentation

## 2021-03-23 DIAGNOSIS — M546 Pain in thoracic spine: Secondary | ICD-10-CM | POA: Insufficient documentation

## 2021-03-23 MED ORDER — HYDROCORT-PRAMOXINE (PERIANAL) 1-1 % EX FOAM: 1.0000 | Freq: Two times a day (BID) | CUTANEOUS | 0 refills | Status: DC

## 2021-03-23 NOTE — Telephone Encounter (Signed)
Patient came in to see Dr. Meridee Score last week and was prescribed suppositories.  She called to request that the prescription be changed to Proctofoam HC.  If this is OK, please call patient and let her know.

## 2021-03-23 NOTE — Telephone Encounter (Signed)
Dr Meridee Score please advise if okay to change to Proctofoam [facility]?

## 2021-03-23 NOTE — Telephone Encounter (Signed)
Usually this is more expensive but if she is able to tolerate and get it I am okay with that medication being used. Same dosing as per suppositories Thanks. GM

## 2021-03-23 NOTE — Telephone Encounter (Signed)
Prescription for Proctofoam [facility] has been sent to pharmacy. Pt has been informed to use it nightly for 7 days, then every other night until she has completed 12 doses. Pt voiced understanding.

## 2021-03-24 ENCOUNTER — Emergency Department (HOSPITAL_COMMUNITY)
Admission: EM | Admit: 2021-03-24 | Discharge: 2021-03-24 | Disposition: A | Discharge status: Home / Self Care | Payer: BC Managed Care – PPO | Attending: Emergency Medicine | Admitting: Emergency Medicine

## 2021-03-24 ENCOUNTER — Emergency Department (HOSPITAL_COMMUNITY): Payer: BC Managed Care – PPO

## 2021-03-24 DIAGNOSIS — Z7951 Long term (current) use of inhaled steroids: Secondary | ICD-10-CM | POA: Diagnosis not present

## 2021-03-24 DIAGNOSIS — J45909 Unspecified asthma, uncomplicated: Secondary | ICD-10-CM | POA: Insufficient documentation

## 2021-03-24 DIAGNOSIS — R1031 Right lower quadrant pain: Secondary | ICD-10-CM | POA: Diagnosis present

## 2021-03-24 DIAGNOSIS — R102 Pelvic and perineal pain: Secondary | ICD-10-CM | POA: Insufficient documentation

## 2021-03-24 DIAGNOSIS — Z91040 Latex allergy status: Secondary | ICD-10-CM | POA: Diagnosis not present

## 2021-03-24 DIAGNOSIS — K5900 Constipation, unspecified: Secondary | ICD-10-CM | POA: Insufficient documentation

## 2021-03-24 DIAGNOSIS — I10 Essential (primary) hypertension: Secondary | ICD-10-CM | POA: Insufficient documentation

## 2021-03-24 DIAGNOSIS — R109 Unspecified abdominal pain: Secondary | ICD-10-CM

## 2021-03-24 DIAGNOSIS — R5383 Other fatigue: Secondary | ICD-10-CM | POA: Insufficient documentation

## 2021-03-24 DIAGNOSIS — M549 Dorsalgia, unspecified: Secondary | ICD-10-CM | POA: Insufficient documentation

## 2021-03-24 DIAGNOSIS — R0602 Shortness of breath: Secondary | ICD-10-CM | POA: Insufficient documentation

## 2021-03-24 DIAGNOSIS — R531 Weakness: Secondary | ICD-10-CM | POA: Diagnosis not present

## 2021-03-24 DIAGNOSIS — Z79899 Other long term (current) drug therapy: Secondary | ICD-10-CM | POA: Diagnosis not present

## 2021-03-24 LAB — COMPREHENSIVE METABOLIC PANEL
ALT: 24 U/L (ref 0–44)
AST: 36 U/L (ref 15–41)
Albumin: 4 g/dL (ref 3.5–5.0)
Alkaline Phosphatase: 79 U/L (ref 38–126)
Anion gap: 4 — ABNORMAL LOW (ref 5–15)
BUN: 8 mg/dL (ref 8–23)
CO2: 29 mmol/L (ref 22–32)
Calcium: 9.6 mg/dL (ref 8.9–10.3)
Chloride: 106 mmol/L (ref 98–111)
Creatinine, Ser: 0.73 mg/dL (ref 0.44–1.00)
GFR, Estimated: >60 mL/min (ref >60)
Glucose, Bld: 100 mg/dL — ABNORMAL HIGH (ref 70–99)
Potassium: 3.4 mmol/L — ABNORMAL LOW (ref 3.5–5.1)
Sodium: 139 mmol/L (ref 135–145)
Total Bilirubin: 0.9 mg/dL (ref 0.3–1.2)
Total Protein: 7.3 g/dL (ref 6.5–8.1)

## 2021-03-24 LAB — URINALYSIS, ROUTINE W REFLEX MICROSCOPIC
Bilirubin Urine: NEGATIVE
Glucose, UA: NEGATIVE mg/dL
Ketones, ur: NEGATIVE mg/dL
Leukocytes,Ua: NEGATIVE
Nitrite: NEGATIVE
Protein, ur: NEGATIVE mg/dL
Specific Gravity, Urine: <1.005 — ABNORMAL LOW (ref 1.005–1.030)
pH: 6 (ref 5.0–8.0)

## 2021-03-24 LAB — LIPASE, BLOOD: Lipase: 28 U/L (ref 11–51)

## 2021-03-24 LAB — CBC WITH DIFFERENTIAL/PLATELET
Abs Immature Granulocytes: 0.01 10*3/uL (ref 0.00–0.07)
Basophils Absolute: 0.1 10*3/uL (ref 0.0–0.1)
Basophils Relative: 1 %
Eosinophils Absolute: 0 10*3/uL (ref 0.0–0.5)
Eosinophils Relative: 1 %
HCT: 35.9 % — ABNORMAL LOW (ref 36.0–46.0)
Hemoglobin: 12.8 g/dL (ref 12.0–15.0)
Immature Granulocytes: 0 %
Lymphocytes Relative: 27 %
Lymphs Abs: 1.7 10*3/uL (ref 0.7–4.0)
MCH: 28.9 pg (ref 26.0–34.0)
MCHC: 35.7 g/dL (ref 30.0–36.0)
MCV: 81 fL (ref 80.0–100.0)
Monocytes Absolute: 0.3 10*3/uL (ref 0.1–1.0)
Monocytes Relative: 5 %
Neutro Abs: 4.1 10*3/uL (ref 1.7–7.7)
Neutrophils Relative %: 66 %
Platelets: 329 10*3/uL (ref 150–400)
RBC: 4.43 MIL/uL (ref 3.87–5.11)
RDW: 13.4 % (ref 11.5–15.5)
WBC: 6.2 10*3/uL (ref 4.0–10.5)
nRBC: 0 % (ref 0.0–0.2)

## 2021-03-24 LAB — URINALYSIS, MICROSCOPIC (REFLEX): Bacteria, UA: NONE SEEN

## 2021-03-24 MED ORDER — IOHEXOL 300 MG/ML  SOLN
100.0000 mL | Freq: Once | INTRAMUSCULAR | Status: AC | PRN
  Administered 2021-03-24: 100 mL via INTRAVENOUS

## 2021-03-24 NOTE — ED Notes (Signed)
Pt verbalized understanding of d/c instructions, meds and followup care. Denies questions. VSS, no distress noted. Steady gait to exit with all belongings.  ?

## 2021-03-24 NOTE — Discharge Instructions (Addendum)
Return to ED with any new or worsening symptoms such as blood in stool, loss of consciousness, fevers, inability to urinate, excessive vomiting, inability to have bowel movement All of your lab work today was largely within normal limits. Your CT scan was also unrevealing of any abnormalities that require acute intervention You will need to follow up with your GI doctor in the coming days for ongoing evaluation and management of your abdominal pain

## 2021-03-24 NOTE — ED Triage Notes (Signed)
Pt. Stated, My Dr. York Spaniel I had a lot of stool in my colon

## 2021-03-24 NOTE — ED Triage Notes (Signed)
Pt. Stated, Bianca [name] had left side stomach pain  and every time I eat I feel sick. I just feel weak. Im scheduled for CT for my stomach.

## 2021-03-24 NOTE — ED Provider Notes (Signed)
[facility] EMERGENCY DEPARTMENT Provider Note   CSN: [id number] Arrival date & time: 03/24/21  0857     History Chief Complaint  Patient presents with   Abdominal Pain   Weakness   Back Pain   Nausea    Bianca [name] is a 65 y.o. female with medical history significant for GERD, hypertension.  Patient reports that 1 week ago she began feeling tired and fatigued and then developed hemorrhoids and was not able to have normal bowel movements.  Patient denies any blood in her stool or on her toilet paper.  Patient also reports that she is changed her diet to a more bland diet.  Patient states her bowel movements became smaller in caliber.  Patient reports she also had pain in her lower abdomen, right lower quadrant of her abdomen, and her back.  She states that this pain is not connected and does not radiate from 1 spot to the other.  Patient reports that the pain comes in waves and describes it as sharp.  She states that eating aggravates the pain and standing up as well.  She denies any alleviating factors.   Abdominal Pain Associated symptoms: constipation, fatigue, nausea and shortness of breath   Associated symptoms: no diarrhea, no dysuria, no fever, no hematuria and no vomiting   Weakness Associated symptoms: abdominal pain, nausea and shortness of breath   Associated symptoms: no diarrhea, no dizziness, no dysuria, no fever and no vomiting   Back Pain Associated symptoms: abdominal pain, pelvic pain and weakness   Associated symptoms: no dysuria and no fever       Past Medical History:  Diagnosis Date   Allergy    Anemia    Anxiety    Asthma    H/O exercise stress test 2008; October 2014   was normal in 2008; positive for ischemia with inferior and lateral ST depressions October 2014   Hx of echocardiogram 091/2012   relatively normal as well. No significant valvar lesions. Normal Ef.   Hypertension    Migraines    Palpitations     Patient  Active Problem List   Diagnosis Date Noted   Hemorrhoids 03/23/2021   Rectal pain 03/23/2021   Change in bowel habits 03/23/2021   RLQ abdominal pain 03/23/2021   Right flank pain 03/23/2021   Acute midline thoracic back pain 03/23/2021   Chest pain 03/13/2013   Abnormal stress ECG with treadmill 02/24/2013   HTN (hypertension) 12/24/2011   Heart palpitations 12/24/2011   Awareness of heartbeats 11/30/2011    Past Surgical History:  Procedure Laterality Date   ABDOMINAL EXPLORATION SURGERY  1980   CARDIAC CATHETERIZATION  04/2010   with no evidence of ischemia or significant coronary disease to speak of, normal LV function with relatively normal EDP.   LEFT HEART CATHETERIZATION WITH CORONARY ANGIOGRAM N/A 03/18/2013   Procedure: LEFT HEART CATHETERIZATION WITH CORONARY ANGIOGRAM;  Surgeon: Kathleene Hazel, MD;  Location: [facility] CATH LAB;  Service: Cardiovascular;  Laterality: N/A;   NASAL SINUS SURGERY       OB History   No obstetric history on file.     Family History  Problem Relation Age of Onset   Hypertension Mother    Asthma Mother    Anemia Mother    Hypertension Father    Stroke Father    Hypertension Sister    Atrial fibrillation Brother    Stroke Brother    Hypertension Brother    Stroke Maternal Grandmother  Diabetes Maternal Grandmother    Cancer Maternal Grandfather    Diabetes Paternal Grandmother    Heart attack Paternal Grandfather    Cancer Paternal Grandfather    Diabetes Paternal Grandfather    Hypertension Other    Colon cancer Neg Hx    Esophageal cancer Neg Hx    Inflammatory bowel disease Neg Hx    Liver disease Neg Hx    Pancreatic cancer Neg Hx    Rectal cancer Neg Hx     Social History   Tobacco Use   Smoking status: Never   Smokeless tobacco: Never  Vaping Use   Vaping Use: Never used  Substance Use Topics   Alcohol use: No    Alcohol/week: 0.0 standard drinks   Drug use: No    Home Medications Prior to Admission  medications   Medication Sig Start Date End Date Taking? Authorizing Provider  acetaminophen (TYLENOL) 325 MG tablet Take 500 mg by mouth every 6 (six) hours as needed for mild pain.     [provider]  albuterol (VENTOLIN HFA) 108 (90 Base) MCG/ACT inhaler Inhale 1-2 puffs into the lungs every 6 (six) hours as needed for wheezing or shortness of breath.    [provider]  ALPRAZolam Duanne Moron) 0.5 MG tablet Take 0.5 mg by mouth as needed. 03/26/19   [provider]  amLODipine (NORVASC) 2.5 MG tablet Take 1 tablet (2.5 mg total) by mouth in the morning and at bedtime. 02/19/21 05/20/21  Burnell Blanks, MD  ascorbic acid (VITAMIN C) 500 MG tablet Take 500 mg by mouth daily.    [provider]  AUVI-Q 0.3 MG/0.3ML SOAJ injection Inject 0.3 mLs (0.3 mg total) into the muscle as needed for anaphylaxis. 07/03/18   Kozlow, Donnamarie Poag, MD  cholecalciferol (VITAMIN D3) 25 MCG (1000 UNIT) tablet Take 1,000 Units by mouth daily.    [provider]  fluticasone (FLONASE) 50 MCG/ACT nasal spray fluticasone propionate 50 mcg/actuation nasal spray,suspension    [provider]  furosemide (LASIX) 20 MG tablet Take 1 tablet (20 mg total) by mouth daily as needed for fluid or edema. 04/15/20 07/14/20  Deberah Pelton, NP  guaiFENesin (MUCINEX) 600 MG 12 hr tablet Take by mouth as needed.     [provider]  hydrocortisone-pramoxine [facility]) rectal foam Place 1 applicator rectally 2 (two) times daily. 03/23/21   Mansouraty, Telford Nab., MD  ipratropium-albuterol (DUONEB) 0.5-2.5 (3) MG/3ML SOLN Take 3 mLs by nebulization every 4 (four) hours as needed. 12/27/18   Kozlow, Donnamarie Poag, MD  Multiple Vitamin (MULTIVITAMIN) capsule Take 1 capsule by mouth daily.     [provider]  psyllium (METAMUCIL) 58.6 % powder Take 1 packet by mouth daily.     [provider]  SYMBICORT 160-4.5 MCG/ACT inhaler Inhale 2 puffs into the lungs 2 (two)  times daily. 01/14/19   Valentina Shaggy, MD    Allergies    Codeine, Amoxicillin, Augmentin [amoxicillin-pot clavulanate], Bee venom, Elemental sulfur, Latex, Metoprolol, Milk-related compounds, Shellfish allergy, and Sulfa antibiotics  Review of Systems   Review of Systems  Constitutional:  Positive for appetite change and fatigue. Negative for fever.  Respiratory:  Positive for shortness of breath.   Gastrointestinal:  Positive for abdominal distention, abdominal pain, constipation and nausea. Negative for blood in stool, diarrhea and vomiting.  Genitourinary:  Positive for flank pain and pelvic pain. Negative for difficulty urinating, dysuria and hematuria.  Musculoskeletal:  Positive for back pain.  Neurological:  Positive for weakness and light-headedness. Negative for dizziness and syncope.  All other systems reviewed and are negative.  Physical Exam Updated Vital Signs BP 140/80 (BP Location: Right Arm)   Pulse 60   Temp 98 F (36.7 C) (Oral)   Resp 16   LMP 11/16/2009   SpO2 99%   Physical Exam Constitutional:      General: She is not in acute distress.    Appearance: She is normal weight. She is not toxic-appearing.  HENT:     Head: Normocephalic.  Eyes:     Extraocular Movements: Extraocular movements intact.  Cardiovascular:     Rate and Rhythm: Normal rate and regular rhythm.  Pulmonary:     Effort: Pulmonary effort is normal.     Breath sounds: Normal breath sounds. No wheezing.  Abdominal:     General: Abdomen is flat. Bowel sounds are normal. There is no distension.     Palpations: Abdomen is soft. There is no hepatomegaly, splenomegaly or mass.     Tenderness: There is abdominal tenderness in the right lower quadrant, epigastric area and suprapubic area. There is no right CVA tenderness or left CVA tenderness.  Skin:    General: Skin is warm and dry.     Capillary Refill: Capillary refill takes less than 2 seconds.  Neurological:     General: No  focal deficit present.     Mental Status: She is alert.    ED Results / Procedures / Treatments   Labs (all labs ordered are listed, but only abnormal results are displayed) Labs Reviewed  COMPREHENSIVE METABOLIC PANEL - Abnormal; Notable for the following components:      Result Value   Potassium 3.4 (*)    Glucose, Bld 100 (*)    Anion gap 4 (*)    All other components within normal limits  CBC WITH DIFFERENTIAL/PLATELET - Abnormal; Notable for the following components:   HCT 35.9 (*)    All other components within normal limits  URINALYSIS, ROUTINE W REFLEX MICROSCOPIC - Abnormal; Notable for the following components:   Specific Gravity, Urine <1.005 (*)    Hgb urine dipstick SMALL (*)    All other components within normal limits  URINE CULTURE  LIPASE, BLOOD  URINALYSIS, MICROSCOPIC (REFLEX)    EKG None  Radiology CT ABDOMEN PELVIS W CONTRAST  Result Date: 03/24/2021 CLINICAL DATA:  Left lower quadrant abdominal pain EXAM: CT ABDOMEN AND PELVIS WITH CONTRAST TECHNIQUE: Multidetector CT imaging of the abdomen and pelvis was performed using the standard protocol following bolus administration of intravenous contrast. CONTRAST:  131mL OMNIPAQUE IOHEXOL 300 MG/ML  SOLN COMPARISON:  None. FINDINGS: Lower chest: No acute abnormality. Hepatobiliary: No focal liver abnormality is seen. No gallstones, gallbladder wall thickening, or biliary dilatation. Pancreas: Unremarkable. No pancreatic ductal dilatation or surrounding inflammatory changes. Spleen: Unremarkable. Adrenals/Urinary Tract: Adrenals are unremarkable. 12 mm cyst of the upper pole the left kidney. Partially distended bladder is unremarkable. Stomach/Bowel: Stomach is within normal limits. Bowel is normal in caliber. Normal appendix. Vascular/Lymphatic: No significant vascular abnormality. No enlarged lymph nodes. Reproductive: Unremarkable. Other: No free fluid.  Abdominal wall is unremarkable. Musculoskeletal: Lumbar spine  degenerative changes. IMPRESSION: No acute abnormality or findings to account for reported symptoms. Electronically Signed   By: Macy Mis M.D.   On: 03/24/2021 10:29    Procedures Procedures   Medications Ordered in ED Medications  iohexol (OMNIPAQUE) 300 MG/ML solution 100 mL (100 mLs Intravenous Contrast Given 03/24/21 1019)    ED  Course  I have reviewed the triage vital signs and the nursing notes.  Pertinent labs & imaging results that were available during my care of the patient were reviewed by me and considered in my medical decision making (see chart for details).    MDM Rules/Calculators/A&P                          65 year old female presents to ED for weakness, lightheadedness, abdominal pain and back pain for the last week.  Patient states that her stool has become smaller in caliber.  On exam patient is afebrile, nontoxic-appearing, normoactive bowel sounds.  Patient denies any red flag symptoms such as groin numbness, bowel or bladder dysfunction, weakness in lower extremities, blood in stool.  DiffDx includes constipation, UTI, bowel obstruction, pancreatitis, appendicitis    Lab work and imaging includes CBC, CMP, urinalysis, lipase, CT abdomen pelvis.  CBC results within normal limits.  CMP results within normal limits.  Urinalysis is unrevealing of any nitrites that would indicate a UTI.  We will culture the urine to ensure that there is no bacterial growth.  Patient lipase is within normal limits so suspicion for pancreatitis is low at this time.  Plain film x-ray of abdomen from 03/19/2021 shows large amount of stool in colon.  Patient states that she is using the bathroom normally and has had 1 bowel movement per day for the last week however, the patient notes that her stool volume is less than it typically would be and she is having to strain.  On examination, patient has normal and active bowel sounds in all 4 quadrants of her abdomen. This large stool  burden noted on plain film x-ray was not noted on today's CT scan. I have a low suspicion for bowel obstruction at this time.   Further, the patient's appendix was noted to be normal with no inflammation or fat stranding.  My suspicion for appendicitis is low at this time.  At present this patient seems suitable for discharge.  Patient is hemodynamically stable, has unrevealing lab work, and no abnormalities noted on her CT.  Presentation not consistent with other acute, emergent causes of abdominal pain at this time.  I discussed with this patient that she will need to follow-up with her GI specialist for further evaluation management of her abdominal pain.  I have given the patient strict return precautions. The patient is in agreement with plan for discharge.  Patient is stable on discharge.  Final Clinical Impression(s) / ED Diagnoses Final diagnoses:  Abdominal pain, unspecified abdominal location    Rx / DC Orders ED Discharge Orders     None        Lawana Chambers 03/24/21 Anner Crete, MD 03/26/21 [id number]

## 2021-03-24 NOTE — ED Provider Notes (Signed)
Emergency Medicine Provider Triage Evaluation Note  Bianca [name] , a 65 y.o. female  was evaluated in triage.  Pt complains of lower abdominal pain for the past week.  Reports feeling lightheaded and generally weak.  Reports still having bowel movements but "less than what I usually do."  She denies any abdominal pain but feels full early.  Denies any chest pain, fever or dysuria.  Review of Systems  Positive: Lightheadedness, abdominal pain Negative: Chest pain fever  Physical Exam  BP 122/84 (BP Location: Right Arm)   Pulse 71   Temp 97.9 F (36.6 C) (Oral)   Resp 17   LMP 11/16/2009   SpO2 100%  Gen:   Awake, no distress   Resp:  Normal effort  MSK:   Moves extremities without difficulty  Other:  Lower abdominal tenderness without rebound or guarding  Medical Decision Making  Medically screening exam initiated at 9:35 AM.  Appropriate orders placed.  Bianca [name] was informed that the remainder of the evaluation will be completed by another provider, this initial triage assessment does not replace that evaluation, and the importance of remaining in the ED until their evaluation is complete.  Lab work done on 12 5 without abnormalities but is scheduled for CT scan in 2 days by her GI doctor.  I will order repeat blood work and CT scan to be done   Dietrich Pates, PA-C 03/24/21 9604    Sloan Leiter, DO 03/24/21 1639

## 2021-03-25 ENCOUNTER — Other Ambulatory Visit: Payer: BC Managed Care – PPO

## 2021-03-25 LAB — URINE CULTURE: Culture: NO GROWTH

## 2021-03-26 ENCOUNTER — Telehealth: Payer: Self-pay | Admitting: Internal Medicine

## 2021-03-26 ENCOUNTER — Inpatient Hospital Stay: Admission: RE | Admit: 2021-03-26 | Payer: BC Managed Care – PPO | Source: Ambulatory Visit

## 2021-03-26 ENCOUNTER — Other Ambulatory Visit: Payer: BC Managed Care – PPO

## 2021-03-26 NOTE — Telephone Encounter (Signed)
Spoke with pt and she is aware of Dr. Elesa Hacker recommendations. Copy of note sent to pts PCP.

## 2021-03-26 NOTE — Telephone Encounter (Signed)
Bianca [name], This is well beyond what we can help with from a GI standpoint. I reviewed the CTAP that she had when she went to the ED which is unremarkable from a GI standpoint. She needs to discuss things with her PCP ASAP or if issues are progressing quickly go to UC or ED with these new symptoms. We can certainly entertain an EGD/Colonscopy as had been my plan per notation pending the workup, but these symptoms need to be sorted out first. Please FYI, the patient's PCP office to this. Thanks.  GM

## 2021-03-26 NOTE — Telephone Encounter (Signed)
Patient called states she is having numbness to her legs and sharp pain and is seeking advise.

## 2021-05-06 ENCOUNTER — Other Ambulatory Visit: Payer: Self-pay | Admitting: Obstetrics & Gynecology

## 2021-05-06 DIAGNOSIS — E2839 Other primary ovarian failure: Secondary | ICD-10-CM

## 2021-05-11 ENCOUNTER — Ambulatory Visit: Payer: BC Managed Care – PPO | Admitting: Internal Medicine

## 2021-05-18 ENCOUNTER — Other Ambulatory Visit: Payer: Self-pay

## 2021-05-18 ENCOUNTER — Ambulatory Visit: Payer: BC Managed Care – PPO | Admitting: Physician Assistant

## 2021-05-18 ENCOUNTER — Encounter: Payer: Self-pay | Admitting: Physician Assistant

## 2021-05-18 VITALS — BP 122/72 | HR 62 | Ht 61.5 in | Wt 154.4 lb

## 2021-05-18 DIAGNOSIS — I201 Angina pectoris with documented spasm: Secondary | ICD-10-CM | POA: Diagnosis not present

## 2021-05-18 DIAGNOSIS — K649 Unspecified hemorrhoids: Secondary | ICD-10-CM | POA: Insufficient documentation

## 2021-05-18 DIAGNOSIS — I10 Essential (primary) hypertension: Secondary | ICD-10-CM | POA: Diagnosis not present

## 2021-05-18 DIAGNOSIS — R600 Localized edema: Secondary | ICD-10-CM | POA: Insufficient documentation

## 2021-05-18 DIAGNOSIS — I491 Atrial premature depolarization: Secondary | ICD-10-CM | POA: Insufficient documentation

## 2021-05-18 HISTORY — DX: Localized edema: R60.0

## 2021-05-18 NOTE — Assessment & Plan Note (Signed)
BP is well controlled.  Continue Amlodipine 2.5 mg once daily to twice daily.   Her 10 year ASCVD risk is 8.1%.  I recommend repeating her fasting Lipids.  Consider starting mod intensity statin Rx if her risk is still > 5%.

## 2021-05-18 NOTE — Patient Instructions (Signed)
Medication Instructions:   Your physician recommends that you continue on your current medications as directed. Please refer to the Current Medication list given to you today.  *If you need a refill on your cardiac medications before your next appointment, please call your pharmacy*   Lab Work:  Your physician recommends that you return for a FASTING lipid profile Friday, February 3. You can come in on the day of your appointment anytime between 7:30-4:30 fasting from midnight the night before.    If you have labs (blood work) drawn today and your tests are completely normal, you will receive your results only by: MyChart Message (if you have MyChart) OR A paper copy in the mail If you have any lab test that is abnormal or we need to change your treatment, we will call you to review the results.   Testing/Procedures:  None ordered.    Follow-Up: At [facility], you and your health needs are our priority.  As part of our continuing mission to provide you with exceptional heart care, we have created designated Provider Care Teams.  These Care Teams include your primary Cardiologist (physician) and Advanced Practice Providers (APPs -  Physician Assistants and Nurse Practitioners) who all work together to provide you with the care you need, when you need it.  We recommend signing up for the patient portal called "MyChart".  Sign up information is provided on this After Visit Summary.  MyChart is used to connect with patients for Virtual Visits (Telemedicine).  Patients are able to view lab/test results, encounter notes, upcoming appointments, etc.  Non-urgent messages can be sent to your provider as well.   To learn more about what you can do with MyChart, go to [email or web address].    Your next appointment:   1 year(s)  The format for your next appointment:   In Person  Provider:   Verne Carrow, MD     Other Instructions  Your physician wants you to follow-up in:  1 year with Dr. Clifton James.  You will receive a reminder letter in the mail two months in advance. If you don't receive a letter, please call our office to schedule the follow-up appointment.

## 2021-05-18 NOTE — Progress Notes (Signed)
Cardiology Office Note:    Date:  05/18/2021   ID:  Bianca [name], DOB 12/08/1955, MRN [id number]  PCP:  Tally Joe, MD  [facility] Providers Cardiologist:  Verne Carrow, MD     Referring MD: Tally Joe, MD   Chief Complaint:  F/u for CAD    Patient Profile: Specialty Problems       Cardiology Problems   HTN (hypertension)   Coronary vasospasm (HCC)    Chest pain 2/2 to possible vasospasm Prior ETT with + ECG changes Cath in 03/2013: no CAD, EF 65% Carotid US 4/14: Normal Amlodipine DC'd in the past 2/2 to hair loss // But has been able to tolerate low dose (2.5 mg)      Premature atrial contractions    Cardiac monitor 07/06/2020:  NSR, average heart rate 67, PACs (3.2%), PVCs (<1%)        History of Present Illness:   Bianca [name] is a 66 y.o. female with the above problem list.  She was last seen by Dr. Clifton James in 3/22.  She had symptoms of palpitations.  A cardiac monitor showed occasional PACs and rare PVCs.  An echocardiogram was ordered, but was never done.  She returns for f/u.  She is here alone.  She is doing well without chest pain, shortness of breath, syncope.  She has some leg edema with standing that improves with elevation.  She notes lightheadedness with bending over.  She does not get lightheaded with standing.  We discussed drinking more water to make sure she is adequately hydrated.  She just finished fasting and has lost weight.  She sometimes takes an extra Amlodipine 2.5 mg later in the day if her BP is running high.  She feels dizzy with elevated BP and this improves when her BP comes down.  She has not had to take much extra Amlodipine since she lost weight.      Past Medical History:  Diagnosis Date   Allergy    Anemia    Anxiety    Asthma    H/O exercise stress test 2008; October 2014   was normal in 2008; positive for ischemia with inferior and lateral ST depressions October 2014   Hx of echocardiogram 091/2012   relatively  normal as well. No significant valvar lesions. Normal Ef.   Hypertension    Lower extremity edema 05/18/2021   PRN Furosemide   Migraines    Palpitations    Current Medications: Current Meds  Medication Sig   acetaminophen (TYLENOL) 325 MG tablet Take 500 mg by mouth every 6 (six) hours as needed for mild pain.    albuterol (VENTOLIN HFA) 108 (90 Base) MCG/ACT inhaler Inhale 1-2 puffs into the lungs every 6 (six) hours as needed for wheezing or shortness of breath.   ALPRAZolam (XANAX) 0.5 MG tablet Take 0.5 mg by mouth as needed.   amLODipine (NORVASC) 2.5 MG tablet Take 1 tablet (2.5 mg total) by mouth in the morning and at bedtime.   ascorbic acid (VITAMIN C) 500 MG tablet Take 500 mg by mouth daily.   AUVI-Q 0.3 MG/0.3ML SOAJ injection Inject 0.3 mLs (0.3 mg total) into the muscle as needed for anaphylaxis.   cholecalciferol (VITAMIN D3) 25 MCG (1000 UNIT) tablet Take 1,000 Units by mouth daily.   fluticasone (FLONASE) 50 MCG/ACT nasal spray fluticasone propionate 50 mcg/actuation nasal spray,suspension   furosemide (LASIX) 20 MG tablet Take 1 tablet (20 mg total) by mouth daily as needed for fluid or  edema.   guaiFENesin (MUCINEX) 600 MG 12 hr tablet Take by mouth as needed.    hydrocortisone-pramoxine (PROCTOFOAM-HC) rectal foam Place 1 applicator rectally 2 (two) times daily.   ipratropium-albuterol (DUONEB) 0.5-2.5 (3) MG/3ML SOLN Take 3 mLs by nebulization every 4 (four) hours as needed.   Multiple Vitamin (MULTIVITAMIN) capsule Take 1 capsule by mouth daily.    psyllium (METAMUCIL) 58.6 % powder Take 1 packet by mouth daily.    SYMBICORT 160-4.5 MCG/ACT inhaler Inhale 2 puffs into the lungs 2 (two) times daily.    Allergies:   Codeine, Amoxicillin, Amoxicillin-pot clavulanate, Bee venom, Elemental sulfur, Latex, Metoprolol, Milk-related compounds, Shellfish allergy, Sulfa antibiotics, and Sulfamethoxazole   Social History   Tobacco Use   Smoking status: Never   Smokeless  tobacco: Never  Vaping Use   Vaping Use: Never used  Substance Use Topics   Alcohol use: No    Alcohol/week: 0.0 standard drinks   Drug use: No    Family Hx: The patient's family history includes Anemia in her mother; Asthma in her mother; Atrial fibrillation in her brother; Cancer in her maternal grandfather and paternal grandfather; Diabetes in her maternal grandmother, paternal grandfather, and paternal grandmother; Heart attack in her paternal grandfather; Hypertension in her brother, father, mother, sister, and another family member; Stroke in her brother, father, and maternal grandmother. There is no history of Colon cancer, Esophageal cancer, Inflammatory bowel disease, Liver disease, Pancreatic cancer, or Rectal cancer.  ROS see HPI  EKGs/Labs/Other Test Reviewed:    EKG:  EKG is  not ordered today.  The ekg ordered today demonstrates n/a  EKG from 03/24/21 was personally reviewed and interpreted:  NSR, HR 70, normal axis, non-specific ST-TW changes, QTc 449  Recent Labs: 03/19/2021: TSH 0.68 03/24/2021: ALT 24; BUN 8; Creatinine, Ser 0.73; Hemoglobin 12.8; Platelets 329; Potassium 3.4; Sodium 139   Recent Lipid Panel No results for input(s): CHOL, TRIG, HDL, VLDL, LDLCALC, LDLDIRECT in the last 8760 hours.   Risk Assessment/Calculations:         Physical Exam:    VS:  BP 122/72 (BP Location: Right Arm)    Pulse 62    Ht 5' 1.5" (1.562 m)    Wt 154 lb 6.4 oz (70 kg)    LMP 11/16/2009    SpO2 96%    BMI 28.70 kg/m     Wt Readings from Last 3 Encounters:  05/18/21 154 lb 6.4 oz (70 kg)  03/19/21 160 lb 8 oz (72.8 kg)  07/06/20 155 lb 9.6 oz (70.6 kg)    Constitutional:      Appearance: Healthy appearance. Not in distress.  Neck:     Vascular: No JVR. JVD normal.  Pulmonary:     Effort: Pulmonary effort is normal.     Breath sounds: No wheezing. No rales.  Cardiovascular:     Normal rate. Regular rhythm. Normal S1. Normal S2.      Murmurs: There is no murmur.   Edema:    Peripheral edema absent.  Abdominal:     Palpations: Abdomen is soft.  Skin:    General: Skin is warm and dry.  Neurological:     Mental Status: Alert and oriented to person, place and time.     Cranial Nerves: Cranial nerves are intact.      ASSESSMENT & PLAN:   Coronary vasospasm (HCC) Her chest pain symptoms are well controlled on Amlodipine.  Continue Amlodipine 2.5 mg once daily to twice daily.   HTN (hypertension) BP  is well controlled.  Continue Amlodipine 2.5 mg once daily to twice daily.   Her 10 year ASCVD risk is 8.1%.  I recommend repeating her fasting Lipids.  Consider starting mod intensity statin Rx if her risk is still > 5%.            Dispo:  Return in about 1 year (around 05/18/2022) for Routine follow up in 1 year with Dr. Clifton JamesMcAlhany. .   Medication Adjustments/Labs and Tests Ordered: Current medicines are reviewed at length with the patient today.  Concerns regarding medicines are outlined above.  Tests Ordered: Orders Placed This Encounter  Procedures   Lipid Profile   Medication Changes: No orders of the defined types were placed in this encounter.  Signed, Tereso NewcomerScott Ac Colan, PA-C  05/18/2021 4:26 PM    [facility] [address], [place], [place]  [address] Phone: [phone]; Fax: [phone]

## 2021-05-18 NOTE — Assessment & Plan Note (Signed)
Her chest pain symptoms are well controlled on Amlodipine.  Continue Amlodipine 2.5 mg once daily to twice daily.

## 2021-05-21 ENCOUNTER — Other Ambulatory Visit: Payer: BC Managed Care – PPO | Admitting: *Deleted

## 2021-05-21 ENCOUNTER — Other Ambulatory Visit: Payer: Self-pay

## 2021-05-21 DIAGNOSIS — I201 Angina pectoris with documented spasm: Secondary | ICD-10-CM

## 2021-05-21 LAB — LIPID PANEL
Chol/HDL Ratio: 2.8 ratio (ref 0.0–4.4)
Cholesterol, Total: 207 mg/dL — ABNORMAL HIGH (ref 100–199)
HDL: 73 mg/dL (ref >39)
LDL Chol Calc (NIH): 126 mg/dL — ABNORMAL HIGH (ref 0–99)
Triglycerides: 45 mg/dL (ref 0–149)
VLDL Cholesterol Cal: 8 mg/dL (ref 5–40)

## 2021-05-26 ENCOUNTER — Telehealth: Payer: Self-pay | Admitting: Cardiovascular Disease

## 2021-05-26 DIAGNOSIS — E785 Hyperlipidemia, unspecified: Secondary | ICD-10-CM

## 2021-05-26 MED ORDER — ATORVASTATIN CALCIUM 20 MG PO TABS: 20.0000 mg | ORAL_TABLET | Freq: Every day | ORAL | 3 refills | Status: DC

## 2021-05-26 NOTE — Telephone Encounter (Signed)
Bianca Shi, PA-C  05/24/2021 12:55 PM EST     LDL above goal PLAN:  -Start Atorvastatin 20 mg once daily  -Fasting Lipids, LFTs in 3 mos.  Richardson Dopp, PA-C    05/24/2021 12:54 PM      Reviewed results with patient and answered all questions.  Bianca [name] may be interested in a nutrition consult in the future.  We discussed dietary changes and potential side effects of atorvastatin.  Bianca [name] will pick up and start taking and we scheduled her a lab appointment for in 3 months.

## 2021-05-26 NOTE — Telephone Encounter (Signed)
Pt was prescribed a new medication by PA Tereso Newcomer, shes not sure what the name of the medication is but does have a few questions... please advise

## 2021-06-03 ENCOUNTER — Other Ambulatory Visit: Payer: Self-pay | Admitting: Family Medicine

## 2021-06-03 DIAGNOSIS — E7800 Pure hypercholesterolemia, unspecified: Secondary | ICD-10-CM

## 2021-06-07 NOTE — Telephone Encounter (Signed)
Decrease Atorvastatin to 10 mg (she can can the 20 mg tab in 1/2) every Mon, Wed, Fri. She can hold it this week and attempt to restart next week. If symptoms recur, stop and let us know. Tereso Newcomer, PA-C    06/07/2021 8:05 AM

## 2021-06-16 NOTE — Telephone Encounter (Signed)
Stop the atorvastatin.   ?She can try ezetimibe 10 mg daily or referred to the lipid clinic to discuss alternative therapies. ?If she decides to try ezetimibe, obtain fasting lipids and LFTs in 3 months. ?Tereso Newcomer, PA-C    ?06/16/2021 1:35 PM   ?

## 2021-06-17 ENCOUNTER — Telehealth: Payer: Self-pay | Admitting: Cardiovascular Disease

## 2021-06-17 MED ORDER — EZETIMIBE 10 MG PO TABS: 10.0000 mg | ORAL_TABLET | Freq: Every day | ORAL | 3 refills | Status: DC

## 2021-06-17 NOTE — Telephone Encounter (Signed)
Lvm for pt to call office to go over Scott's ?recommendations.  ?

## 2021-06-17 NOTE — Telephone Encounter (Signed)
Patient states she was returning a call. Please advise  

## 2021-06-17 NOTE — Telephone Encounter (Signed)
See previous note

## 2021-06-17 NOTE — Telephone Encounter (Signed)
Spoke with the patient who states that she will try zetia 10 mg daily. She will discontinue atorvastatin. Lab work has already been scheduled.  ?

## 2021-06-25 ENCOUNTER — Ambulatory Visit
Admission: RE | Admit: 2021-06-25 | Discharge: 2021-06-25 | Disposition: A | Discharge status: Home / Self Care | Payer: No Typology Code available for payment source | Source: Ambulatory Visit | Attending: Family Medicine | Admitting: Family Medicine

## 2021-06-25 DIAGNOSIS — E7800 Pure hypercholesterolemia, unspecified: Secondary | ICD-10-CM

## 2021-08-20 ENCOUNTER — Other Ambulatory Visit: Payer: BC Managed Care – PPO

## 2021-09-15 ENCOUNTER — Ambulatory Visit: Payer: Medicare PPO | Admitting: Cardiovascular Disease

## 2021-09-15 ENCOUNTER — Telehealth: Payer: Self-pay | Admitting: Cardiovascular Disease

## 2021-09-15 ENCOUNTER — Encounter: Payer: Self-pay | Admitting: Cardiovascular Disease

## 2021-09-15 VITALS — BP 126/64 | HR 69 | Ht 61.5 in | Wt 152.2 lb

## 2021-09-15 DIAGNOSIS — I10 Essential (primary) hypertension: Secondary | ICD-10-CM | POA: Diagnosis not present

## 2021-09-15 DIAGNOSIS — I201 Angina pectoris with documented spasm: Secondary | ICD-10-CM

## 2021-09-15 NOTE — Patient Instructions (Signed)
Medication Instructions:  No changes. *If you need a refill on your cardiac medications before your next appointment, please call your pharmacy*   Lab Work: None. If you have labs (blood work) drawn today and your tests are completely normal, you will receive your results only by: MyChart Message (if you have MyChart) OR A paper copy in the mail If you have any lab test that is abnormal or we need to change your treatment, we will call you to review the results.   Testing/Procedures: None.   Follow-Up: At [facility], you and your health needs are our priority.  As part of our continuing mission to provide you with exceptional heart care, we have created designated Provider Care Teams.  These Care Teams include your primary Cardiologist (physician) and Advanced Practice Providers (APPs -  Physician Assistants and Nurse Practitioners) who all work together to provide you with the care you need, when you need it.  We recommend signing up for the patient portal called "MyChart".  Sign up information is provided on this After Visit Summary.  MyChart is used to connect with patients for Virtual Visits (Telemedicine).  Patients are able to view lab/test results, encounter notes, upcoming appointments, etc.  Non-urgent messages can be sent to your provider as well.   To learn more about what you can do with MyChart, go to [email or web address].    Your next appointment:   12 month(s)  The format for your next appointment:   In Person  Provider:   Verne Carrow, MD {   Important Information About Sugar

## 2021-09-15 NOTE — Telephone Encounter (Signed)
Noted  

## 2021-09-15 NOTE — Telephone Encounter (Signed)
Pt c/o BP issue: STAT if pt c/o blurred vision, one-sided weakness or slurred speech  1. What are your last 5 BP readings? After her first dos of prep 160/92, took 2 pills and anxiety med 125/81 like usual, has stayed around there since  2. Are you having any other symptoms (ex. Dizziness, headache, blurred vision, passed out)? Dizziness, lightheadedness, and chest pain at time of prep, not now  3. What is your BP issue? Patient states she is having a colonoscopy, but the prep she has to take for it has shot up her BP. She states she is concerned and would like to discuss this with Dr. Clifton James. She is scheduled for today at 4:20 pm.

## 2021-09-15 NOTE — Progress Notes (Signed)
r   Chief Complaint  Patient presents with   Follow-up    HTN    History of Present Illness: 66 yo female with history of chest pain, HTN, asthma, anxiety and suspicion for coronary vasospasm who is here today for cardiac follow up. She has had chronic chest pain with a normal cardiac cath in 2012 and again in 2014. 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 Echo January 2012 with normal LV function, no valve issues. She has been on Norvasc and has tolerated this well. I saw her in March 2019 and she c/o mild edema. I ordered an echo and started Lasix. She cancelled the echo. I saw her 06/15/20 and she was doing well with occasional LE edema. Echo was planned but not yet completed. I saw her in March 2022 and she c/o palpitations. Cardiac monitor April 2022 with sinus with PACs.   She is here today for follow up. She was doing a colonoscopy prep on Monday and she began to feel poorly. Her BP was elevated at 145/87 then went up to 160/92. She felt bad for several hours. There was associated chest pressure after she swallowed the prep agent.  The patient denies any dyspnea, palpitations, lower extremity edema, orthopnea, PND, dizziness, near syncope or syncope.    Primary Care Physician: Tally Joe, MD  Past Medical History:  Diagnosis Date   Allergy    Anemia    Anxiety    Asthma    H/O exercise stress test 2008; October 2014   was normal in 2008; positive for ischemia with inferior and lateral ST depressions October 2014   Hx of echocardiogram 091/2012   relatively normal as well. No significant valvar lesions. Normal Ef.   Hypertension    Lower extremity edema 05/18/2021   PRN Furosemide   Migraines    Palpitations     Past Surgical History:  Procedure Laterality Date   ABDOMINAL EXPLORATION SURGERY  1980   CARDIAC CATHETERIZATION  04/2010   with no evidence of ischemia or significant coronary disease to speak of, normal LV function with relatively normal EDP.   LEFT HEART  CATHETERIZATION WITH CORONARY ANGIOGRAM N/A 03/18/2013   Procedure: LEFT HEART CATHETERIZATION WITH CORONARY ANGIOGRAM;  Surgeon: Kathleene Hazel, MD;  [facility] C[facility] LAB;  Service: Cardiovascular;  Laterality: N/A;   NASAL SINUS SURGERY      Current Outpatient Medications  Medication Sig Dispense Refill   acetaminophen (TYLENOL) 325 MG tablet Take 500 mg by mouth every 6 (six) hours as needed for mild pain.      albuterol (VENTOLIN HFA) 108 (90 Base) MCG/ACT inhaler Inhale 1-2 puffs into the lungs every 6 (six) hours as needed for wheezing or shortness of breath.     ALPRAZolam (XANAX) 0.5 MG tablet Take 0.5 mg by mouth as needed.     ascorbic acid (VITAMIN C) 500 MG tablet Take 500 mg by mouth daily.     AUVI-Q 0.3 MG/0.3ML SOAJ injection Inject 0.3 mLs (0.3 mg total) into the muscle as needed for anaphylaxis. 2 Device 1   cholecalciferol (VITAMIN D3) 25 MCG (1000 UNIT) tablet Take 1,000 Units by mouth daily.     fluticasone (FLONASE) 50 MCG/ACT nasal spray fluticasone propionate 50 mcg/actuation nasal spray,suspension     guaiFENesin (MUCINEX) 600 MG 12 hr tablet Take by mouth as needed.      hydrocortisone-pramoxine (PROCTOFOAM-HC) rectal foam Place 1 applicator rectally 2 (two) times daily. 30 g 0   ipratropium-albuterol (DUONEB) 0.5-2.5 (3) MG/3ML SOLN Take  3 mLs by nebulization every 4 (four) hours as needed. 3 mL 1   Multiple Vitamin (MULTIVITAMIN) capsule Take 1 capsule by mouth daily.      SYMBICORT 160-4.5 MCG/ACT inhaler Inhale 2 puffs into the lungs 2 (two) times daily. 1 Inhaler 5   amLODipine (NORVASC) 2.5 MG tablet Take 1 tablet (2.5 mg total) by mouth in the morning and at bedtime. 180 tablet 3   furosemide (LASIX) 20 MG tablet Take 1 tablet (20 mg total) by mouth daily as needed for fluid or edema. 30 tablet 0   No current facility-administered medications for this visit.    Allergies  Allergen Reactions   Codeine Other (See Comments)    Other reaction(s): Other  (See Comments) Pounding in head  Pounding in head    Amoxicillin Other (See Comments)    Diarrhea and vomiting.  Has patient had a PCN reaction causing immediate rash, facial/tongue/throat swelling, SOB or lightheadedness with hypotension: No Has patient had a PCN reaction causing severe rash involving mucus membranes or skin necrosis: No Has patient had a PCN reaction that required hospitalization No Has patient had a PCN reaction occurring within the last 10 years: No If all of the above answers are "NO", then may proceed with Cephalosporin use.    Amoxicillin-Pot Clavulanate Other (See Comments)    Diarrhea and vomiting.    Bee Venom     Swelling   Elemental Sulfur    Latex Hives and Other (See Comments)   Metoprolol Other (See Comments)    Hair loss   Milk-Related Compounds     Diarrhea & gas   Shellfish Allergy     HIVES, SWELLING, N/V & DIARREHA   Sulfa Antibiotics    Sulfamethoxazole Other (See Comments)    Social History   Socioeconomic History   Marital status: Single    Spouse name: Not on file   Number of children: 0   Years of education: Not on file   Highest education level: Not on file  Occupational History   Occupation: Retired Chartered loss adjuster  Tobacco Use   Smoking status: Never   Smokeless tobacco: Never  Vaping Use   Vaping Use: Never used  Substance and Sexual Activity   Alcohol use: No    Alcohol/week: 0.0 standard drinks   Drug use: No   Sexual activity: Not Currently  Other Topics Concern   Not on file  Social History Narrative   Single woman, who lives alone. 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   She does not smoke or drink alcohol.   She occasionally exercises walking on a treadmill.   Social Determinants of Health   Financial Resource Strain: Not on file  Food Insecurity: Not on file  Transportation Needs: Not on file   Physical Activity: Not on file  Stress: Not on file  Social Connections: Not on file  Intimate Partner Violence: Not on file    Family History  Problem Relation Age of Onset   Hypertension Mother    Asthma Mother    Anemia Mother    Hypertension Father    Stroke Father    Hypertension Sister    Atrial fibrillation Brother    Stroke Brother    Hypertension Brother    Stroke Maternal Grandmother    Diabetes Maternal Grandmother    Cancer Maternal Grandfather    Diabetes Paternal Grandmother  Heart attack Paternal Grandfather    Cancer Paternal Grandfather    Diabetes Paternal Grandfather    Hypertension Other    Colon cancer Neg Hx    Esophageal cancer Neg Hx    Inflammatory bowel disease Neg Hx    Liver disease Neg Hx    Pancreatic cancer Neg Hx    Rectal cancer Neg Hx     Review of Systems:  As stated in the HPI and otherwise negative.   BP 126/64   Pulse 69   Ht 5' 1.5" (1.562 m)   Wt 152 lb 3.2 oz (69 kg)   LMP 11/16/2009   SpO2 99%   BMI 28.29 kg/m   Physical Examination: General: Well developed, well nourished, NAD  HEENT: OP clear, mucus membranes moist  SKIN: warm, dry. No rashes. Neuro: No focal deficits  Musculoskeletal: Muscle strength 5/5 all ext  Psychiatric: Mood and affect normal  Neck: No JVD, no carotid bruits, no thyromegaly, no lymphadenopathy.  Lungs:Clear bilaterally, no wheezes, rhonci, crackles Cardiovascular: Regular rate and rhythm. No murmurs, gallops or rubs. Abdomen:Soft. Bowel sounds present. Non-tender.  Extremities: No lower extremity edema. Pulses are 2 + in the bilateral DP/PT.  Cardiac cath 03/18/13: Left main: No obstructive disease.  Left Anterior Descending Artery: Large caliber vessel that courses to the apex. There are several small caliber diagonal branches. No obstructive disease.  Circumflex Artery: Large caliber vessel with large obtuse marginal branch. No obstructive disease.  Right Coronary Artery: Large  dominant vessel with no obstructive disease.  Left Ventricular Angiogram: LVEF=65%.  Impression:  1. No angiographic evidence of CAD  2. Normal LV systolic function  3. Chest pain, cannot fully exclude coronary vasospasm with ST depression on treadmill exercise test.   EKG:  EKG is not ordered today. The ekg ordered today demonstrates   Recent Labs: 03/19/2021: TSH 0.68 03/24/2021: ALT 24; BUN 8; Creatinine, Ser 0.73; Hemoglobin 12.8; Platelets 329; Potassium 3.4; Sodium 139   Lipid Panel    Component Value Date/Time   CHOL 207 (H) 05/21/2021 0934   TRIG 45 05/21/2021 0934   HDL 73 05/21/2021 0934   CHOLHDL 2.8 05/21/2021 0934   LDLCALC 126 (H) 05/21/2021 0934     Wt Readings from Last 3 Encounters:  09/15/21 152 lb 3.2 oz (69 kg)  05/18/21 154 lb 6.4 oz (70 kg)  03/19/21 160 lb 8 oz (72.8 kg)     Other studies Reviewed: Additional studies/ records that were reviewed today include: . 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 Coronary CT calcium score of zero in March 2023. No recent chest pain. Continue Norvasc.   2. HTN: BP controlled. NO changes  3. Lower extremity edema: No LE edema. Lasix prn.   4. Palpitations/PACs: No recent palpitations.   5. Poor response to bowel prep agent: I have asked her to discuss this with GI.   Current medicines are reviewed at length with the patient today.  The patient does not have concerns regarding medicines.  The following changes have been made:  no change  Labs/ tests ordered today include:   No orders of the defined types were placed in this encounter.   Disposition:   FU with me in 12  months  Signed, Verne Carrowhristopher Lenny Fiumara, MD 09/15/2021 4:54 PM    [facility]  [address], [place], [place]  [address] Phone: [phone]; Fax: [phone]

## 2021-09-21 ENCOUNTER — Telehealth: Payer: Self-pay | Admitting: Cardiovascular Disease

## 2021-09-21 NOTE — Telephone Encounter (Signed)
I spoke with the patient.  She had a printout from [facility] that indicated Zetia caused her to have amnesia and she has never told anyone that before.  The medication made her weak when taken along w her statin.    She had her [facility] office remove this and wanted to be sure it was removed from her [facility] chart as well.   We discussed that the two -[facility] and [facility] - use two different charting systems and are not connected.  I adv pt that this information is not in her [facility] chart at all as far as I can see.  I told her she should call back if she sees this on any of her [facility] printouts.

## 2021-09-21 NOTE — Telephone Encounter (Signed)
Pt c/o medication issue:  1. Name of Medication:    2. How are you currently taking this medication (dosage and times per day)?    3. Are you having a reaction (difficulty breathing--STAT)? no  4. What is your medication issue? Patient called in to say our office had put that her reaction to a medication was amnesia. But patient states that wasn't true and she wants it remove from her chart. Please advise .

## 2021-09-24 ENCOUNTER — Other Ambulatory Visit: Payer: Self-pay

## 2021-10-05 ENCOUNTER — Ambulatory Visit
Admission: RE | Admit: 2021-10-05 | Discharge: 2021-10-05 | Disposition: A | Discharge status: Home / Self Care | Payer: Medicare PPO | Source: Ambulatory Visit | Attending: Obstetrics & Gynecology | Admitting: Obstetrics & Gynecology

## 2021-10-05 DIAGNOSIS — E2839 Other primary ovarian failure: Secondary | ICD-10-CM

## 2021-10-20 ENCOUNTER — Ambulatory Visit
Admission: RE | Admit: 2021-10-20 | Discharge: 2021-10-20 | Disposition: A | Discharge status: Home / Self Care | Payer: Medicare PPO | Source: Ambulatory Visit | Attending: Gastroenterology | Admitting: Gastroenterology

## 2021-10-20 ENCOUNTER — Other Ambulatory Visit: Payer: Self-pay | Admitting: Gastroenterology

## 2021-10-20 DIAGNOSIS — R1030 Lower abdominal pain, unspecified: Secondary | ICD-10-CM

## 2021-10-20 DIAGNOSIS — R194 Change in bowel habit: Secondary | ICD-10-CM

## 2021-11-16 ENCOUNTER — Other Ambulatory Visit: Payer: Self-pay | Admitting: Obstetrics and Gynecology

## 2021-11-16 DIAGNOSIS — H0288B Meibomian gland dysfunction left eye, upper and lower eyelids: Secondary | ICD-10-CM | POA: Diagnosis not present

## 2021-11-16 DIAGNOSIS — H524 Presbyopia: Secondary | ICD-10-CM | POA: Diagnosis not present

## 2021-11-16 DIAGNOSIS — H25042 Posterior subcapsular polar age-related cataract, left eye: Secondary | ICD-10-CM | POA: Diagnosis not present

## 2021-11-16 DIAGNOSIS — N644 Mastodynia: Secondary | ICD-10-CM

## 2021-11-16 DIAGNOSIS — H2513 Age-related nuclear cataract, bilateral: Secondary | ICD-10-CM | POA: Diagnosis not present

## 2021-11-16 DIAGNOSIS — H5213 Myopia, bilateral: Secondary | ICD-10-CM | POA: Diagnosis not present

## 2021-11-16 DIAGNOSIS — H52222 Regular astigmatism, left eye: Secondary | ICD-10-CM | POA: Diagnosis not present

## 2021-11-16 DIAGNOSIS — M7062 Trochanteric bursitis, left hip: Secondary | ICD-10-CM | POA: Diagnosis not present

## 2021-11-16 DIAGNOSIS — H0288A Meibomian gland dysfunction right eye, upper and lower eyelids: Secondary | ICD-10-CM | POA: Diagnosis not present

## 2021-11-22 DIAGNOSIS — R1032 Left lower quadrant pain: Secondary | ICD-10-CM | POA: Diagnosis not present

## 2021-11-23 ENCOUNTER — Other Ambulatory Visit: Payer: Self-pay | Admitting: Gastroenterology

## 2021-11-23 DIAGNOSIS — R1032 Left lower quadrant pain: Secondary | ICD-10-CM | POA: Diagnosis not present

## 2021-11-23 DIAGNOSIS — R109 Unspecified abdominal pain: Secondary | ICD-10-CM

## 2021-11-23 DIAGNOSIS — R1030 Lower abdominal pain, unspecified: Secondary | ICD-10-CM | POA: Diagnosis not present

## 2021-11-23 DIAGNOSIS — K5900 Constipation, unspecified: Secondary | ICD-10-CM | POA: Diagnosis not present

## 2021-11-29 ENCOUNTER — Other Ambulatory Visit: Payer: Medicare PPO

## 2021-11-30 ENCOUNTER — Ambulatory Visit
Admission: RE | Admit: 2021-11-30 | Discharge: 2021-11-30 | Disposition: A | Discharge status: Home / Self Care | Payer: Medicare PPO | Source: Ambulatory Visit | Attending: Gastroenterology | Admitting: Gastroenterology

## 2021-11-30 DIAGNOSIS — H538 Other visual disturbances: Secondary | ICD-10-CM | POA: Diagnosis not present

## 2021-11-30 DIAGNOSIS — K5900 Constipation, unspecified: Secondary | ICD-10-CM | POA: Diagnosis not present

## 2021-11-30 DIAGNOSIS — N289 Disorder of kidney and ureter, unspecified: Secondary | ICD-10-CM | POA: Diagnosis not present

## 2021-11-30 DIAGNOSIS — H2000 Unspecified acute and subacute iridocyclitis: Secondary | ICD-10-CM | POA: Diagnosis not present

## 2021-11-30 DIAGNOSIS — R109 Unspecified abdominal pain: Secondary | ICD-10-CM

## 2021-11-30 DIAGNOSIS — H5711 Ocular pain, right eye: Secondary | ICD-10-CM | POA: Diagnosis not present

## 2021-11-30 DIAGNOSIS — R140 Abdominal distension (gaseous): Secondary | ICD-10-CM | POA: Diagnosis not present

## 2021-11-30 MED ORDER — IOPAMIDOL (ISOVUE-300) INJECTION 61%
100.0000 mL | Freq: Once | INTRAVENOUS | Status: AC | PRN
  Administered 2021-11-30: 100 mL via INTRAVENOUS

## 2021-12-02 DIAGNOSIS — K5900 Constipation, unspecified: Secondary | ICD-10-CM | POA: Diagnosis not present

## 2021-12-02 DIAGNOSIS — N289 Disorder of kidney and ureter, unspecified: Secondary | ICD-10-CM | POA: Diagnosis not present

## 2021-12-02 DIAGNOSIS — I10 Essential (primary) hypertension: Secondary | ICD-10-CM | POA: Diagnosis not present

## 2021-12-02 DIAGNOSIS — R1084 Generalized abdominal pain: Secondary | ICD-10-CM | POA: Diagnosis not present

## 2021-12-05 ENCOUNTER — Encounter: Payer: Self-pay | Admitting: Cardiovascular Disease

## 2021-12-06 ENCOUNTER — Other Ambulatory Visit: Payer: Self-pay | Admitting: Family Medicine

## 2021-12-06 DIAGNOSIS — N289 Disorder of kidney and ureter, unspecified: Secondary | ICD-10-CM

## 2021-12-09 ENCOUNTER — Other Ambulatory Visit: Payer: Self-pay

## 2021-12-09 ENCOUNTER — Emergency Department (HOSPITAL_COMMUNITY)
Admission: EM | Admit: 2021-12-09 | Discharge: 2021-12-09 | Disposition: A | Discharge status: Home / Self Care | Payer: Medicare PPO | Attending: Emergency Medicine | Admitting: Emergency Medicine

## 2021-12-09 ENCOUNTER — Encounter (HOSPITAL_COMMUNITY): Payer: Self-pay | Admitting: Emergency Medicine

## 2021-12-09 DIAGNOSIS — Z91040 Latex allergy status: Secondary | ICD-10-CM | POA: Insufficient documentation

## 2021-12-09 DIAGNOSIS — Z79899 Other long term (current) drug therapy: Secondary | ICD-10-CM | POA: Insufficient documentation

## 2021-12-09 DIAGNOSIS — R197 Diarrhea, unspecified: Secondary | ICD-10-CM | POA: Insufficient documentation

## 2021-12-09 DIAGNOSIS — R109 Unspecified abdominal pain: Secondary | ICD-10-CM | POA: Diagnosis present

## 2021-12-09 DIAGNOSIS — K5900 Constipation, unspecified: Secondary | ICD-10-CM | POA: Diagnosis not present

## 2021-12-09 LAB — URINALYSIS, ROUTINE W REFLEX MICROSCOPIC
Bacteria, UA: NONE SEEN
Bilirubin Urine: NEGATIVE
Glucose, UA: NEGATIVE mg/dL
Ketones, ur: 5 mg/dL — AB
Leukocytes,Ua: NEGATIVE
Nitrite: NEGATIVE
Protein, ur: NEGATIVE mg/dL
Specific Gravity, Urine: 1.003 — ABNORMAL LOW (ref 1.005–1.030)
pH: 5 (ref 5.0–8.0)

## 2021-12-09 LAB — CBC
HCT: 36.2 % (ref 36.0–46.0)
Hemoglobin: 13.2 g/dL (ref 12.0–15.0)
MCH: 29.5 pg (ref 26.0–34.0)
MCHC: 36.5 g/dL — ABNORMAL HIGH (ref 30.0–36.0)
MCV: 80.8 fL (ref 80.0–100.0)
Platelets: 339 10*3/uL (ref 150–400)
RBC: 4.48 MIL/uL (ref 3.87–5.11)
RDW: 14 % (ref 11.5–15.5)
WBC: 7.1 10*3/uL (ref 4.0–10.5)
nRBC: 0 % (ref 0.0–0.2)

## 2021-12-09 LAB — COMPREHENSIVE METABOLIC PANEL
ALT: 27 U/L (ref 0–44)
AST: 32 U/L (ref 15–41)
Albumin: 4.3 g/dL (ref 3.5–5.0)
Alkaline Phosphatase: 57 U/L (ref 38–126)
Anion gap: 8 (ref 5–15)
BUN: 9 mg/dL (ref 8–23)
CO2: 25 mmol/L (ref 22–32)
Calcium: 9.8 mg/dL (ref 8.9–10.3)
Chloride: 99 mmol/L (ref 98–111)
Creatinine, Ser: 0.74 mg/dL (ref 0.44–1.00)
GFR, Estimated: >60 mL/min (ref >60)
Glucose, Bld: 103 mg/dL — ABNORMAL HIGH (ref 70–99)
Potassium: 3.7 mmol/L (ref 3.5–5.1)
Sodium: 132 mmol/L — ABNORMAL LOW (ref 135–145)
Total Bilirubin: 1.1 mg/dL (ref 0.3–1.2)
Total Protein: 7.5 g/dL (ref 6.5–8.1)

## 2021-12-09 LAB — LIPASE, BLOOD: Lipase: 56 U/L — ABNORMAL HIGH (ref 11–51)

## 2021-12-09 NOTE — ED Provider Notes (Signed)
[facility]-EMERGENCY DEPT Provider Note   CSN: [id number] Arrival date & time: 12/09/21  1201     History  Chief Complaint  Patient presents with   Abdominal Pain   Diarrhea    Bianca [name] is a 66 y.o. female.  66 year old female presents with over 54-month history of crampy abdominal pain with associated constipation.  No nausea or vomiting.  No fever though some chills.  Denies any urinary symptoms.  Has been seen by her gastroenterologist, Dr. Bosie Clos, for same.  No definitive diagnosis at this time.  Has been taking MiraLAX for her constipation.  Did have a bowel movement today.       Home Medications Prior to Admission medications   Medication Sig Start Date End Date Taking? Authorizing Provider  acetaminophen (TYLENOL) 325 MG tablet Take 500 mg by mouth every 6 (six) hours as needed for mild pain.     [provider]  albuterol (VENTOLIN HFA) 108 (90 Base) MCG/ACT inhaler Inhale 1-2 puffs into the lungs every 6 (six) hours as needed for wheezing or shortness of breath.    [provider]  ALPRAZolam Prudy Feeler) 0.5 MG tablet Take 0.5 mg by mouth as needed. 03/26/19   [provider]  amLODipine (NORVASC) 2.5 MG tablet Take 1 tablet (2.5 mg total) by mouth in the morning and at bedtime. 02/19/21 05/20/21  Kathleene Hazel, MD  ascorbic acid (VITAMIN C) 500 MG tablet Take 500 mg by mouth daily.    [provider]  AUVI-Q 0.3 MG/0.3ML SOAJ injection Inject 0.3 mLs (0.3 mg total) into the muscle as needed for anaphylaxis. 07/03/18   Kozlow, Alvira Philips, MD  cholecalciferol (VITAMIN D3) 25 MCG (1000 UNIT) tablet Take 1,000 Units by mouth daily.    [provider]  fluticasone (FLONASE) 50 MCG/ACT nasal spray fluticasone propionate 50 mcg/actuation nasal spray,suspension    [provider]  furosemide (LASIX) 20 MG tablet Take 1 tablet (20 mg total) by mouth daily as needed for fluid or edema. 04/15/20 05/18/21   Ronney Asters, NP  guaiFENesin (MUCINEX) 600 MG 12 hr tablet Take by mouth as needed.     [provider]  hydrocortisone-pramoxine [facility]) rectal foam Place 1 applicator rectally 2 (two) times daily. 03/23/21   Mansouraty, Netty Starring., MD  ipratropium-albuterol (DUONEB) 0.5-2.5 (3) MG/3ML SOLN Take 3 mLs by nebulization every 4 (four) hours as needed. 12/27/18   Kozlow, Alvira Philips, MD  Multiple Vitamin (MULTIVITAMIN) capsule Take 1 capsule by mouth daily.     [provider]  SYMBICORT 160-4.5 MCG/ACT inhaler Inhale 2 puffs into the lungs 2 (two) times daily. 01/14/19   Alfonse Spruce, MD      Allergies    Codeine, Amoxicillin, Amoxicillin-pot clavulanate, Bee venom, Elemental sulfur, Latex, Metoprolol, Milk-related compounds, Shellfish allergy, Sulfa antibiotics, and Sulfamethoxazole    Review of Systems   Review of Systems  All other systems reviewed and are negative.   Physical Exam Updated Vital Signs BP (!) 149/82 (BP Location: Left Arm)   Pulse 63   Temp 98.1 F (36.7 C) (Oral)   Resp 15   LMP 11/16/2009   SpO2 100%  Physical Exam Vitals and nursing note reviewed.  Constitutional:      General: She is not in acute distress.    Appearance: Normal appearance. She is well-developed. She is not toxic-appearing.  HENT:     Head: Normocephalic and atraumatic.  Eyes:     General: Lids are normal.  Conjunctiva/sclera: Conjunctivae normal.     Pupils: Pupils are equal, round, and reactive to light.  Neck:     Thyroid: No thyroid mass.     Trachea: No tracheal deviation.  Cardiovascular:     Rate and Rhythm: Normal rate and regular rhythm.     Heart sounds: Normal heart sounds. No murmur heard.    No gallop.  Pulmonary:     Effort: Pulmonary effort is normal. No respiratory distress.     Breath sounds: Normal breath sounds. No stridor. No decreased breath sounds, wheezing, rhonchi or rales.  Abdominal:     General: There is no distension.      Palpations: Abdomen is soft.     Tenderness: There is no abdominal tenderness. There is no rebound.  Musculoskeletal:        General: No tenderness. Normal range of motion.     Cervical back: Normal range of motion and neck supple.  Skin:    General: Skin is warm and dry.     Findings: No abrasion or rash.  Neurological:     Mental Status: She is alert and oriented to person, place, and time. Mental status is at baseline.     GCS: GCS eye subscore is 4. GCS verbal subscore is 5. GCS motor subscore is 6.     Cranial Nerves: No cranial nerve deficit.     Sensory: No sensory deficit.     Motor: Motor function is intact.  Psychiatric:        Attention and Perception: Attention normal.        Speech: Speech normal.        Behavior: Behavior normal.     ED Results / Procedures / Treatments   Labs (all labs ordered are listed, but only abnormal results are displayed) Labs Reviewed  LIPASE, BLOOD - Abnormal; Notable for the following components:      Result Value   Lipase 56 (*)    All other components within normal limits  COMPREHENSIVE METABOLIC PANEL - Abnormal; Notable for the following components:   Sodium 132 (*)    Glucose, Bld 103 (*)    All other components within normal limits  CBC - Abnormal; Notable for the following components:   MCHC 36.5 (*)    All other components within normal limits  URINALYSIS, ROUTINE W REFLEX MICROSCOPIC - Abnormal; Notable for the following components:   Color, Urine STRAW (*)    Specific Gravity, Urine 1.003 (*)    Hgb urine dipstick MODERATE (*)    Ketones, ur 5 (*)    All other components within normal limits    EKG None  Radiology No results found.  Procedures Procedures    Medications Ordered in ED Medications - No data to display  ED Course/ Medical Decision Making/ A&P                           Medical Decision Making Amount and/or Complexity of Data Reviewed Labs: ordered.   Patient's labs here are reassuring.   Abdominal exam is benign.  No indication for abdominal imaging.  Suspect that patient likely has IBS.  Patient encouraged to follow-up with her gastroenterologist.  No indication for admission        Final Clinical Impression(s) / ED Diagnoses Final diagnoses:  None    Rx / DC Orders ED Discharge Orders     None         Lorre Nick, MD  12/09/21 1547  

## 2021-12-09 NOTE — ED Triage Notes (Signed)
Pt reports abd pain, diarrhea x a few months, but worsening today. Last Tuesday pt had CT scan and was told she needed to give a stool sample, which she did but states she hasnt heard results of that yet.

## 2021-12-09 NOTE — ED Provider Triage Note (Signed)
Emergency Medicine Provider Triage Evaluation Note  KRISLYN DONNAN , a 66 y.o. female  was evaluated in triage.  Pt complains of months of crampy abd pain and constipation/diarrhea.  Seems bloating, gas and pain are main complaints.   Followed by Bosie Clos of GI -- thought pain was constipation related. Taking miralax. Diarrhea today. Non-bloody. Stool study pending.  Today had chills and felt more pain and came to ER.   Review of Systems  Positive: Constipation/diarrhea Negative: Fever   Physical Exam  BP (!) 151/92   Pulse (!) 106   Temp 98 F (36.7 C) (Oral)   Resp 16   LMP 11/16/2009   SpO2 100%  Gen:   Awake, no distress   Resp:  Normal effort  MSK:   Moves extremities without difficulty  Other:  Abd soft NTTP  Medical Decision Making  Medically screening exam initiated at 12:26 PM.  Appropriate orders placed.  ELYN KROGH was informed that the remainder of the evaluation will be completed by another provider, this initial triage assessment does not replace that evaluation, and the importance of remaining in the ED until their evaluation is complete.  [address]    Solon Augusta Solvang, [place] 12/09/21 1229

## 2021-12-23 DIAGNOSIS — K581 Irritable bowel syndrome with constipation: Secondary | ICD-10-CM | POA: Diagnosis not present

## 2021-12-23 DIAGNOSIS — R140 Abdominal distension (gaseous): Secondary | ICD-10-CM | POA: Diagnosis not present

## 2021-12-23 DIAGNOSIS — R1013 Epigastric pain: Secondary | ICD-10-CM | POA: Diagnosis not present

## 2021-12-23 DIAGNOSIS — R1012 Left upper quadrant pain: Secondary | ICD-10-CM | POA: Diagnosis not present

## 2022-01-07 DIAGNOSIS — I10 Essential (primary) hypertension: Secondary | ICD-10-CM | POA: Diagnosis not present

## 2022-01-07 DIAGNOSIS — F439 Reaction to severe stress, unspecified: Secondary | ICD-10-CM | POA: Diagnosis not present

## 2022-01-07 DIAGNOSIS — R634 Abnormal weight loss: Secondary | ICD-10-CM | POA: Diagnosis not present

## 2022-01-07 DIAGNOSIS — J302 Other seasonal allergic rhinitis: Secondary | ICD-10-CM | POA: Diagnosis not present

## 2022-01-07 DIAGNOSIS — Z0000 Encounter for general adult medical examination without abnormal findings: Secondary | ICD-10-CM | POA: Diagnosis not present

## 2022-01-07 DIAGNOSIS — F419 Anxiety disorder, unspecified: Secondary | ICD-10-CM | POA: Diagnosis not present

## 2022-01-07 DIAGNOSIS — E7800 Pure hypercholesterolemia, unspecified: Secondary | ICD-10-CM | POA: Diagnosis not present

## 2022-01-07 DIAGNOSIS — R21 Rash and other nonspecific skin eruption: Secondary | ICD-10-CM | POA: Diagnosis not present

## 2022-01-07 DIAGNOSIS — N289 Disorder of kidney and ureter, unspecified: Secondary | ICD-10-CM | POA: Diagnosis not present

## 2022-01-11 DIAGNOSIS — N390 Urinary tract infection, site not specified: Secondary | ICD-10-CM | POA: Diagnosis not present

## 2022-01-14 ENCOUNTER — Telehealth: Payer: Self-pay | Admitting: Cardiovascular Disease

## 2022-01-14 NOTE — Telephone Encounter (Signed)
Pt c/o medication issue:  1. Name of Medication:   amLODipine (NORVASC) 2.5 MG tablet (Expired)  2. How are you currently taking this medication (dosage and times per day)? As prescribed  3. Are you having a reaction (difficulty breathing--STAT)?   No  4. What is your medication issue?   Patient stated she has lost 20 lbs and she would like to take less of this medication.  BP 109/69  HR 59 when she is active.

## 2022-01-14 NOTE — Telephone Encounter (Signed)
Returned call to patient.  Patient states she has lost weight recently and noticed her BP is lower with readings around 109/69 and HR 59 when active. She also reports feeling fatigued. She would like to cut her Amlodipine in half. She currently takes Amlodipine 2.5mg  QD (it is listed in her chart as BID and she would like to keep it this way because she will sometimes take more as needed for BP fluctuations).  Advised patient we will need Dr. Camillia Herter input on this, but she can try taking half of the 2.5mg  tablet and monitoring her BP to be sure it doesn't start going up too high.   Patient verbalized understanding and expressed appreciation for call.  Will forward to Dr. Angelena Form to review and advise.

## 2022-01-18 NOTE — Telephone Encounter (Signed)
Burnell Blanks, MD  You 5 hours ago (7:59 AM)    I think that sounds like a good plan. Gerald Stabs    I called the patient to let her know.  She said her BP started going back up at night.  She has gone back to a whole tablet daily.  She may try taking 1/2 in AM and other 1/2 in PM.  Will continue to monitor BP.

## 2022-01-19 ENCOUNTER — Encounter (HOSPITAL_COMMUNITY): Payer: Self-pay

## 2022-01-19 ENCOUNTER — Ambulatory Visit (HOSPITAL_COMMUNITY)
Admission: EM | Admit: 2022-01-19 | Discharge: 2022-01-19 | Disposition: A | Discharge status: Home / Self Care | Payer: Medicare PPO | Attending: Family Medicine | Admitting: Family Medicine

## 2022-01-19 ENCOUNTER — Ambulatory Visit (INDEPENDENT_AMBULATORY_CARE_PROVIDER_SITE_OTHER): Payer: Medicare PPO

## 2022-01-19 ENCOUNTER — Ambulatory Visit (HOSPITAL_COMMUNITY): Admit: 2022-01-19 | Payer: BC Managed Care – PPO

## 2022-01-19 DIAGNOSIS — R1013 Epigastric pain: Secondary | ICD-10-CM

## 2022-01-19 DIAGNOSIS — R079 Chest pain, unspecified: Secondary | ICD-10-CM | POA: Diagnosis not present

## 2022-01-19 MED ORDER — OMEPRAZOLE 40 MG PO CPDR: 40.0000 mg | DELAYED_RELEASE_CAPSULE | Freq: Every day | ORAL | 1 refills | Status: DC

## 2022-01-19 MED ORDER — LIDOCAINE VISCOUS HCL 2 % MT SOLN
15.0000 mL | Freq: Once | OROMUCOSAL | Status: AC
  Administered 2022-01-19: 15 mL via ORAL

## 2022-01-19 MED ORDER — ALUM & MAG HYDROXIDE-SIMETH 200-200-20 MG/5ML PO SUSP
30.0000 mL | Freq: Once | ORAL | Status: AC
Start: 2022-01-19 — End: 2022-01-19
  Administered 2022-01-19: 30 mL via ORAL

## 2022-01-19 NOTE — ED Provider Notes (Signed)
[place]    CSN: [id number] Arrival date & time: 01/19/22  1843      History   Chief Complaint Chief Complaint  Patient presents with   Abdominal Pain    HPI Bianca [name] is a 66 y.o. female.    Abdominal Pain  Here for epigastric pain that has been going on for about 2 weeks.  Earlier today about 2 to 3 hours ago it moved into her lower central chest.  She has been burping a lot.  It does not necessarily relieve it.  She states that her breathing feels a little shallow because of it, uncertain if it is because it makes it hurt to breathe deeply or what is causing that.  For about 2 months she has had some intermittent epigastric pain.  She has not had any fever or cough or nausea or vomiting.  She does already see cardiology.  She has been referred to a GI specialist by her primary see them later this year.  She tried to Gas-X today and has not had any relief.  Past Medical History:  Diagnosis Date   Allergy    Anemia    Anxiety    Asthma    H/O exercise stress test 2008; October 2014   was normal in 2008; positive for ischemia with inferior and lateral ST depressions October 2014   Hx of echocardiogram 091/2012   relatively normal as well. No significant valvar lesions. Normal Ef.   Hypertension    Lower extremity edema 05/18/2021   PRN Furosemide   Migraines    Palpitations     Patient Active Problem List   Diagnosis Date Noted   Coronary vasospasm ([place]) 05/18/2021   Premature atrial contractions 05/18/2021   Lower extremity edema 05/18/2021   Hemorrhoids 05/18/2021   Rectal pain 03/23/2021   Change in bowel habits 03/23/2021   RLQ abdominal pain 03/23/2021   Right flank pain 03/23/2021   Acute midline thoracic back pain 03/23/2021   Chest pain 03/13/2013   Abnormal stress ECG with treadmill 02/24/2013   HTN (hypertension) 12/24/2011   Heart palpitations 12/24/2011   Awareness of heartbeats 11/30/2011    Past Surgical History:   Procedure Laterality Date   [place]  04/2010   with no evidence of ischemia or significant coronary disease to speak of, normal LV function with relatively normal EDP.   LEFT HEART CATHETERIZATION WITH CORONARY ANGIOGRAM N/A 03/18/2013   Procedure: LEFT HEART CATHETERIZATION WITH CORONARY ANGIOGRAM;  Surgeon: Burnell Blanks, MD;  Location: [facility] CATH LAB;  Service: Cardiovascular;  Laterality: N/A;   NASAL SINUS SURGERY      OB History   No obstetric history on file.      Home Medications    Prior to Admission medications   Medication Sig Start Date End Date Taking? Authorizing Provider  omeprazole (PRILOSEC) 40 MG capsule Take 1 capsule (40 mg total) by mouth daily. 01/19/22  Yes Barrett Henle, MD  acetaminophen (TYLENOL) 325 MG tablet Take 500 mg by mouth every 6 (six) hours as needed for mild pain.     [provider]  albuterol (VENTOLIN HFA) 108 (90 Base) MCG/ACT inhaler Inhale 1-2 puffs into the lungs every 6 (six) hours as needed for wheezing or shortness of breath.    [provider]  ALPRAZolam Duanne Moron) 0.5 MG tablet Take 0.5 mg by mouth as needed. 03/26/19   [provider]  amLODipine (NORVASC) 2.5  MG tablet Take 1 tablet (2.5 mg total) by mouth in the morning and at bedtime. 02/19/21 05/20/21  Burnell Blanks, MD  ascorbic acid (VITAMIN C) 500 MG tablet Take 500 mg by mouth daily.    [provider]  AUVI-Q 0.3 MG/0.3ML SOAJ injection Inject 0.3 mLs (0.3 mg total) into the muscle as needed for anaphylaxis. 07/03/18   Kozlow, Donnamarie Poag, MD  cholecalciferol (VITAMIN D3) 25 MCG (1000 UNIT) tablet Take 1,000 Units by mouth daily.    [provider]  fluticasone (FLONASE) 50 MCG/ACT nasal spray fluticasone propionate 50 mcg/actuation nasal spray,suspension    [provider]  furosemide (LASIX) 20 MG tablet Take 1 tablet (20 mg total) by mouth daily as needed for  fluid or edema. 04/15/20 05/18/21  Deberah Pelton, NP  guaiFENesin (MUCINEX) 600 MG 12 hr tablet Take by mouth as needed.     [provider]  hydrocortisone-pramoxine [facility]) rectal foam Place 1 applicator rectally 2 (two) times daily. 03/23/21   Mansouraty, Telford Nab., MD  ipratropium-albuterol (DUONEB) 0.5-2.5 (3) MG/3ML SOLN Take 3 mLs by nebulization every 4 (four) hours as needed. 12/27/18   Kozlow, Donnamarie Poag, MD  Multiple Vitamin (MULTIVITAMIN) capsule Take 1 capsule by mouth daily.     [provider]  SYMBICORT 160-4.5 MCG/ACT inhaler Inhale 2 puffs into the lungs 2 (two) times daily. 01/14/19   Valentina Shaggy, MD    Family History Family History  Problem Relation Age of Onset   Hypertension Mother    Asthma Mother    Anemia Mother    Hypertension Father    Stroke Father    Hypertension Sister    Atrial fibrillation Brother    Stroke Brother    Hypertension Brother    Stroke Maternal Grandmother    Diabetes Maternal Grandmother    Cancer Maternal Grandfather    Diabetes Paternal Grandmother    Heart attack Paternal Grandfather    Cancer Paternal Grandfather    Diabetes Paternal Grandfather    Hypertension Other    Colon cancer Neg Hx    Esophageal cancer Neg Hx    Inflammatory bowel disease Neg Hx    Liver disease Neg Hx    Pancreatic cancer Neg Hx    Rectal cancer Neg Hx     Social History Social History   Tobacco Use   Smoking status: Never   Smokeless tobacco: Never  Vaping Use   Vaping Use: Never used  Substance Use Topics   Alcohol use: No    Alcohol/week: 0.0 standard drinks of alcohol   Drug use: No     Allergies   Codeine, Amoxicillin, Amoxicillin-pot clavulanate, Bee venom, Elemental sulfur, Latex, Metoprolol, Milk-related compounds, Shellfish allergy, Sulfa antibiotics, and Sulfamethoxazole   Review of Systems Review of Systems  Gastrointestinal:  Positive for abdominal pain.     Physical Exam Triage Vital  Signs ED Triage Vitals  Enc Vitals Group     BP 01/19/22 1940 (!) 152/93     Pulse Rate 01/19/22 1940 61     Resp 01/19/22 1940 16     Temp 01/19/22 1940 98.1 F (36.7 C)     Temp Source 01/19/22 1940 Oral     SpO2 01/19/22 1940 96 %     Weight --      Height --      Head Circumference --      Peak Flow --      Pain Score 01/19/22 1939 2  Pain Loc --      Pain Edu? --      Excl. in [place]? --    No data found.  Updated Vital Signs BP (!) 152/93 (BP Location: Left Arm)   Pulse 61   Temp 98.1 F (36.7 C) (Oral)   Resp 16   LMP 11/16/2009   SpO2 96%   Visual Acuity Right Eye Distance:   Left Eye Distance:   Bilateral Distance:    Right Eye Near:   Left Eye Near:    Bilateral Near:     Physical Exam Vitals reviewed.  Constitutional:      General: She is not in acute distress.    Appearance: She is not ill-appearing, toxic-appearing or diaphoretic.  HENT:     Mouth/Throat:     Mouth: Mucous membranes are moist.  Eyes:     Extraocular Movements: Extraocular movements intact.     Pupils: Pupils are equal, round, and reactive to light.  Cardiovascular:     Rate and Rhythm: Normal rate and regular rhythm.     Heart sounds: No murmur heard. Pulmonary:     Effort: Pulmonary effort is normal.     Breath sounds: No stridor. No wheezing, rhonchi or rales.  Chest:     Chest wall: Tenderness (Over her lower third of her sternum.) present.  Abdominal:     Palpations: Abdomen is soft.     Comments: She has tenderness in her epigastric area, no mass felt there  Musculoskeletal:     Cervical back: Neck supple.  Skin:    Coloration: Skin is not jaundiced or pale.  Neurological:     General: No focal deficit present.     Mental Status: She is alert and oriented to person, place, and time.      UC Treatments / Results  Labs (all labs ordered are listed, but only abnormal results are displayed) Labs Reviewed - No data to display  EKG   Radiology DG Chest 2  View  Result Date: 01/19/2022 CLINICAL DATA:  Lower central chest pain EXAM: CHEST - 2 VIEW COMPARISON:  01/27/2020 FINDINGS: Frontal and lateral views of the chest demonstrate an unremarkable cardiac silhouette. No acute airspace disease, effusion, or pneumothorax. No acute bony abnormalities. IMPRESSION: 1. No acute intrathoracic process. Electronically Signed   By: Randa Ngo M.D.   On: 01/19/2022 20:19    Procedures Procedures (including critical care time)  Medications Ordered in UC Medications  alum & mag hydroxide-simeth (MAALOX/MYLANTA) 200-200-20 MG/5ML suspension 30 mL (30 mLs Oral Given 01/19/22 2016)    And  lidocaine (XYLOCAINE) 2 % viscous mouth solution 15 mL (15 mLs Oral Given 01/19/22 2016)    Initial Impression / Assessment and Plan / UC Course  I have reviewed the triage vital signs and the nursing notes.  Pertinent labs & imaging results that were available during my care of the patient were reviewed by me and considered in my medical decision making (see chart for details).        EKG shows normal sinus rhythm without any acute ST changes.  It is unchanged from previous.  Patient is given a GI cocktail.` She can only get a certain amount of it down, but it did improve her chest and abdominal symptoms.  She then related to me that she was having trouble swallowing, "is this normal?"  I discussed the effects of the lidocaine and the GI cocktail, and precautions she needed to take in the next couple of hours  I also discussed with her comments that she had seen on her EKG concerning possible atrial enlargement.  Her EKG is unchanged from previous and she has had echocardiograms with a cardiologist that were deemed normal by her report.  I do not think she has any acute abnormalities on her EKG  She is going to take omeprazole to see if that helps.  I do want her to follow-up with GI.  If she worsens anyway she is to report to the emergency room Final Clinical  Impressions(s) / UC Diagnoses   Final diagnoses:  Central chest pain  Abdominal pain, epigastric     Discharge Instructions      You were given a dose of Maalox and viscous lidocaine and Benadryl.  It can numb up your throat and mouth, so be careful not to drink liquids too quickly in the ensuing 2 hours  Take omeprazole 40 mg--1 capsule daily for stomach acid.  Your EKG did not show any acute changes that are worrisome at this time  Your chest x-ray was normal     ED Prescriptions     Medication Sig Dispense Auth. Provider   omeprazole (PRILOSEC) 40 MG capsule Take 1 capsule (40 mg total) by mouth daily. 30 capsule Barrett Henle, MD      PDMP not reviewed this encounter.   Barrett Henle, MD 01/19/22 2032

## 2022-01-19 NOTE — Discharge Instructions (Addendum)
You were given a dose of Maalox and viscous lidocaine and Benadryl.  It can numb up your throat and mouth, so be careful not to drink liquids too quickly in the ensuing 2 hours  Take omeprazole 40 mg--1 capsule daily for stomach acid.  Your EKG did not show any acute changes that are worrisome at this time  Your chest x-ray was normal

## 2022-01-19 NOTE — ED Notes (Signed)
Patient did take about half of maalox and viscous lidocaine, but could not finish treatment.  Notified dr Windy Carina.  No further orders at this time

## 2022-01-19 NOTE — ED Triage Notes (Signed)
Pt is here for pain in the stomach that has moved up to the chest. Pt has been burping and been very gassy  on and off xfew months

## 2022-01-20 ENCOUNTER — Ambulatory Visit (HOSPITAL_COMMUNITY): Payer: BC Managed Care – PPO

## 2022-01-20 ENCOUNTER — Emergency Department (HOSPITAL_COMMUNITY)
Admission: EM | Admit: 2022-01-20 | Discharge: 2022-01-21 | Discharge status: Against Medical Advice | Payer: Medicare PPO | Attending: Emergency Medicine | Admitting: Emergency Medicine

## 2022-01-20 ENCOUNTER — Other Ambulatory Visit: Payer: Self-pay

## 2022-01-20 DIAGNOSIS — R1010 Upper abdominal pain, unspecified: Secondary | ICD-10-CM | POA: Diagnosis not present

## 2022-01-20 DIAGNOSIS — Z5321 Procedure and treatment not carried out due to patient leaving prior to being seen by health care provider: Secondary | ICD-10-CM | POA: Diagnosis not present

## 2022-01-20 LAB — COMPREHENSIVE METABOLIC PANEL
ALT: 21 U/L (ref 0–44)
AST: 30 U/L (ref 15–41)
Albumin: 4 g/dL (ref 3.5–5.0)
Alkaline Phosphatase: 65 U/L (ref 38–126)
Anion gap: 11 (ref 5–15)
BUN: 8 mg/dL (ref 8–23)
CO2: 24 mmol/L (ref 22–32)
Calcium: 10.3 mg/dL (ref 8.9–10.3)
Chloride: 103 mmol/L (ref 98–111)
Creatinine, Ser: 0.74 mg/dL (ref 0.44–1.00)
GFR, Estimated: >60 mL/min (ref >60)
Glucose, Bld: 86 mg/dL (ref 70–99)
Potassium: 3.5 mmol/L (ref 3.5–5.1)
Sodium: 138 mmol/L (ref 135–145)
Total Bilirubin: 0.7 mg/dL (ref 0.3–1.2)
Total Protein: 7.2 g/dL (ref 6.5–8.1)

## 2022-01-20 LAB — CBC
HCT: 35.9 % — ABNORMAL LOW (ref 36.0–46.0)
Hemoglobin: 13 g/dL (ref 12.0–15.0)
MCH: 29.7 pg (ref 26.0–34.0)
MCHC: 36.2 g/dL — ABNORMAL HIGH (ref 30.0–36.0)
MCV: 82 fL (ref 80.0–100.0)
Platelets: 404 10*3/uL — ABNORMAL HIGH (ref 150–400)
RBC: 4.38 MIL/uL (ref 3.87–5.11)
RDW: 14.1 % (ref 11.5–15.5)
WBC: 7 10*3/uL (ref 4.0–10.5)
nRBC: 0 % (ref 0.0–0.2)

## 2022-01-20 LAB — URINALYSIS, ROUTINE W REFLEX MICROSCOPIC
Bacteria, UA: NONE SEEN
Bilirubin Urine: NEGATIVE
Glucose, UA: NEGATIVE mg/dL
Ketones, ur: 5 mg/dL — AB
Leukocytes,Ua: NEGATIVE
Nitrite: NEGATIVE
Protein, ur: NEGATIVE mg/dL
Specific Gravity, Urine: 1.004 — ABNORMAL LOW (ref 1.005–1.030)
pH: 5 (ref 5.0–8.0)

## 2022-01-20 LAB — LIPASE, BLOOD: Lipase: 27 U/L (ref 11–51)

## 2022-01-20 NOTE — ED Provider Triage Note (Signed)
Emergency Medicine Provider Triage Evaluation Note  Bianca [name] , a 66 y.o. female  was evaluated in triage.  Pt complains of upper abdominal pain.  Patient has history of GI issues, has been seen by GI multiple times.  Patient was seen in urgent care yesterday, placed on Prilosec.  Patient has not picked up Prilosec yet.  Patient states pain is worsened after eating foods.  Patient denies any NSAID use, EtOH use.  Patient denies any blood in stool.  Patient denies any fevers, nausea or vomiting.  Patient was seen yesterday urgent care, given GI cocktail which did relieve her symptoms.  Review of Systems  Positive:  Negative:   Physical Exam  BP (!) 145/79 (BP Location: Left Arm)   Pulse 72   Temp 98.2 F (36.8 C) (Oral)   Resp 18   LMP 11/16/2009   SpO2 98%  Gen:   Awake, no distress   Resp:  Normal effort  MSK:   Moves extremities without difficulty  Other:    Medical Decision Making  Medically screening exam initiated at 2:06 PM.  Appropriate orders placed.  Bianca [name] was informed that the remainder of the evaluation will be completed by another provider, this initial triage assessment does not replace that evaluation, and the importance of remaining in the ED until their evaluation is complete.     Azucena Cecil, PA-C 01/20/22 1407

## 2022-01-20 NOTE — ED Triage Notes (Signed)
Abdomen pain and up in her chest. Started yesterday afternoon. Pt was prescribed Prilosec at [facility] but has not taken. Pt took Pepcid this morning.

## 2022-01-20 NOTE — ED Notes (Signed)
Pt stated that they were leaving due to wait times 

## 2022-01-21 DIAGNOSIS — R319 Hematuria, unspecified: Secondary | ICD-10-CM | POA: Diagnosis not present

## 2022-01-24 DIAGNOSIS — L308 Other specified dermatitis: Secondary | ICD-10-CM | POA: Diagnosis not present

## 2022-01-28 ENCOUNTER — Ambulatory Visit
Admission: RE | Admit: 2022-01-28 | Discharge: 2022-01-28 | Disposition: A | Discharge status: Home / Self Care | Payer: Medicare PPO | Source: Ambulatory Visit | Attending: Family Medicine | Admitting: Family Medicine

## 2022-01-28 DIAGNOSIS — N289 Disorder of kidney and ureter, unspecified: Secondary | ICD-10-CM

## 2022-01-28 DIAGNOSIS — N2889 Other specified disorders of kidney and ureter: Secondary | ICD-10-CM | POA: Diagnosis not present

## 2022-01-28 MED ORDER — GADOPICLENOL 0.5 MMOL/ML IV SOLN
7.0000 mL | Freq: Once | INTRAVENOUS | Status: AC | PRN
  Administered 2022-01-28: 7 mL via INTRAVENOUS

## 2022-01-31 DIAGNOSIS — R634 Abnormal weight loss: Secondary | ICD-10-CM | POA: Diagnosis not present

## 2022-01-31 DIAGNOSIS — Z87898 Personal history of other specified conditions: Secondary | ICD-10-CM | POA: Diagnosis not present

## 2022-01-31 DIAGNOSIS — L309 Dermatitis, unspecified: Secondary | ICD-10-CM | POA: Diagnosis not present

## 2022-01-31 DIAGNOSIS — H6993 Unspecified Eustachian tube disorder, bilateral: Secondary | ICD-10-CM | POA: Diagnosis not present

## 2022-01-31 DIAGNOSIS — E049 Nontoxic goiter, unspecified: Secondary | ICD-10-CM | POA: Diagnosis not present

## 2022-02-03 DIAGNOSIS — N898 Other specified noninflammatory disorders of vagina: Secondary | ICD-10-CM | POA: Diagnosis not present

## 2022-02-03 DIAGNOSIS — R300 Dysuria: Secondary | ICD-10-CM | POA: Diagnosis not present

## 2022-02-03 DIAGNOSIS — N952 Postmenopausal atrophic vaginitis: Secondary | ICD-10-CM | POA: Diagnosis not present

## 2022-02-07 ENCOUNTER — Other Ambulatory Visit: Payer: Self-pay | Admitting: Home Modifications

## 2022-02-07 DIAGNOSIS — E041 Nontoxic single thyroid nodule: Secondary | ICD-10-CM | POA: Diagnosis not present

## 2022-02-07 DIAGNOSIS — Z23 Encounter for immunization: Secondary | ICD-10-CM | POA: Diagnosis not present

## 2022-02-09 DIAGNOSIS — F4322 Adjustment disorder with anxiety: Secondary | ICD-10-CM | POA: Diagnosis not present

## 2022-02-10 ENCOUNTER — Ambulatory Visit
Admission: RE | Admit: 2022-02-10 | Discharge: 2022-02-10 | Disposition: A | Discharge status: Home / Self Care | Payer: Medicare PPO | Source: Ambulatory Visit | Attending: Home Modifications | Admitting: Home Modifications

## 2022-02-10 DIAGNOSIS — E041 Nontoxic single thyroid nodule: Secondary | ICD-10-CM

## 2022-02-16 ENCOUNTER — Ambulatory Visit: Payer: BC Managed Care – PPO | Admitting: Physician Assistant

## 2022-02-22 DIAGNOSIS — F4322 Adjustment disorder with anxiety: Secondary | ICD-10-CM | POA: Diagnosis not present

## 2022-02-23 DIAGNOSIS — H53142 Visual discomfort, left eye: Secondary | ICD-10-CM | POA: Diagnosis not present

## 2022-02-23 DIAGNOSIS — H538 Other visual disturbances: Secondary | ICD-10-CM | POA: Diagnosis not present

## 2022-02-23 DIAGNOSIS — H43812 Vitreous degeneration, left eye: Secondary | ICD-10-CM | POA: Diagnosis not present

## 2022-02-23 DIAGNOSIS — H25042 Posterior subcapsular polar age-related cataract, left eye: Secondary | ICD-10-CM | POA: Diagnosis not present

## 2022-02-25 ENCOUNTER — Telehealth: Payer: Self-pay | Admitting: Cardiovascular Disease

## 2022-02-25 DIAGNOSIS — R42 Dizziness and giddiness: Secondary | ICD-10-CM | POA: Diagnosis not present

## 2022-02-25 NOTE — Telephone Encounter (Signed)
Pt c/o BP issue: STAT if pt c/o blurred vision, one-sided weakness or slurred speech  1. What are your last 5 BP readings? Went up to 162/87 this week, she says she took an extra pill to bring it down, it was 137/85 a few minutes ago, states ithas been ranging 137-138/85-87  2. Are you having any other symptoms (ex. Dizziness, headache, blurred vision, passed out)? Lightheaded sometimes  3. What is your BP issue? Patient states her BP has been high since Monday. She says since Monday she has also been seeing lines in her left eye and having lightheadedness. She says she has been taking an extra half pill in the evenings, because her BP has been high.

## 2022-02-25 NOTE — Telephone Encounter (Signed)
The patient started noticing high BP since Sunday evening and heart palpitations on and off. States she has been having lightheadedness and pounding in her chest/palpitations, frequent urination while drinking 60-80 oz of water a day. Mentions her BP has been going up in the afternoons and she has been taking an extra amlodipine 1/2 a tablet daily this week. Of note the patient has only been taking, prior to this week, 2.5 mg QD, not BID, for around ~6-8 months.   She states she has digestion problems, inflammation under her skin with dark patches (she has been to the dermatologist), and lower back pain. She has lost 25 lbs in the last 6 months unintentionally. She is concerned that this maybe related to being on the amlodipine for > 5 years. She is asking if she can get a new BP medication because of these side effects.  She notes she has squiggly lines in her field of vision in the left eye. She went to the ophthalmologist on Wednesday, they felt like it was an ocular headache and would going away on its own but it is still present.    11/5 160/92 - this is when she noticed it was up and started taking 1.5 tablets daily. 11/5 146/91 11/6 162/87 11/6 148/80 11/7  139/82 11/8  139/82 11/9  136/77 11/10 137/82 3 pm   Gave ED precautions. Verbalized understanding and agreement.  Will forward to MD for advisement.

## 2022-02-28 MED ORDER — LOSARTAN POTASSIUM 25 MG PO TABS: 25.0000 mg | ORAL_TABLET | Freq: Every day | ORAL | 3 refills | Status: DC

## 2022-02-28 NOTE — Telephone Encounter (Signed)
Patient is following up due to not hearing back from anyone.. please return call.

## 2022-02-28 NOTE — Telephone Encounter (Signed)
I reiterated to patient that I feel her symptoms need reviewed with her primary care provider.

## 2022-02-28 NOTE — Telephone Encounter (Signed)
Pt called back per [facility] triage message.    Discussed with Pt Nira Retort RN note on 11/10;  Pt has BP concerns, and c/o of intermittent left eye visual disturbance.  Pt wanted an appointment with Dr. Clifton James to discuss her BP concerns.    Pt scheduled for an appointment with Dr. Clifton James on 11/17 at 830 am per Pt request, and because she was symptomatic.  Pt refused appointment with APP.

## 2022-02-28 NOTE — Telephone Encounter (Signed)
Called patient back inquiring about discontinuing her Amlodipine, and starting Losartan per Dr. Gibson Ramp note.    Pt stated she spoke to Dr. Gibson Ramp nurse, and that is what they are going to do.    Pt and nurse handing her BP concerns; No follow up by Weston Brass in Triage required at this time.

## 2022-02-28 NOTE — Telephone Encounter (Signed)
Pt calling back for an update, she states her bp is still high

## 2022-02-28 NOTE — Telephone Encounter (Signed)
Spoke w patient. We reviewed that she feels the amlodipine, even though it is not a diuretic, is causing her to urinate very frequently, which causes constipation.  The more she drinks, the more often she has to empty her bladder.   I asked her to see PCP re: the symptoms she reported.  She tells me just that she has.  And had a kidney ultrasound.     She would like to switch from amlodipine to losartan daily.  She is concerned about any potential side effects of losartan and will commit to trying it for 2 weeks to see if symptoms resolve.  If they do not she may revert back to amlodipine.   Per her request, I scheduled an appointment with Dr. Clifton James in about 3 weeks.  I told her we can cancel this if she is doing well on the losartan.  She will call in about 2 weeks to let us know.

## 2022-03-02 DIAGNOSIS — R350 Frequency of micturition: Secondary | ICD-10-CM | POA: Diagnosis not present

## 2022-03-02 DIAGNOSIS — Z23 Encounter for immunization: Secondary | ICD-10-CM | POA: Diagnosis not present

## 2022-03-02 DIAGNOSIS — H539 Unspecified visual disturbance: Secondary | ICD-10-CM | POA: Diagnosis not present

## 2022-03-02 DIAGNOSIS — R634 Abnormal weight loss: Secondary | ICD-10-CM | POA: Diagnosis not present

## 2022-03-02 DIAGNOSIS — E049 Nontoxic goiter, unspecified: Secondary | ICD-10-CM | POA: Diagnosis not present

## 2022-03-04 ENCOUNTER — Ambulatory Visit: Payer: Medicare PPO | Admitting: Cardiovascular Disease

## 2022-03-07 ENCOUNTER — Telehealth: Payer: Self-pay | Admitting: Cardiovascular Disease

## 2022-03-07 DIAGNOSIS — F4322 Adjustment disorder with anxiety: Secondary | ICD-10-CM | POA: Diagnosis not present

## 2022-03-07 NOTE — Telephone Encounter (Signed)
Pt c/o medication issue:  1. Name of Medication: losartan (COZAAR) 25 MG tablet   2. How are you currently taking this medication (dosage and times per day)? As prescribed   3. Are you having a reaction (difficulty breathing--STAT)? Yes  4. What is your medication issue? Pt states that she started this medication two days ago and has been feeling weak, off balance and lightheaded.

## 2022-03-07 NOTE — Telephone Encounter (Signed)
I called and spoke with the patient.  She just started losartan 25 mg yesterday.  Yesterday and today it has made her feel wobbly, weak like she may fall and just off.   I had previously asked her to take it at night but she has not done so yet.  She has allergy testing tomorrow and is worried about her blood pressure if she waits until bedtime tomorrow.    I asked her to wait until bedtime and also reduce the dose to 1/2 tablet and do this for the next several days until she gets used to it.  I asked her about the previous symptoms that she thought was coming from amlodipine.  She said she really hasn't noticed urinary frequency and as for constipation it was not every day so that is really no different.

## 2022-03-08 ENCOUNTER — Telehealth: Payer: Self-pay | Admitting: Cardiovascular Disease

## 2022-03-08 ENCOUNTER — Ambulatory Visit: Payer: Medicare PPO | Admitting: Allergy and Immunology

## 2022-03-08 MED ORDER — AMLODIPINE BESYLATE 2.5 MG PO TABS: 2.5000 mg | ORAL_TABLET | Freq: Two times a day (BID) | ORAL | 3 refills | Status: DC

## 2022-03-08 NOTE — Telephone Encounter (Signed)
Pt c/o BP issue: STAT if pt c/o blurred vision, one-sided weakness or slurred speech  1. What are your last 5 BP readings?  144/84 142/83 104/84 144/87 These are all today  2. Are you having any other symptoms (ex. Dizziness, headache, blurred vision, passed out)? Patient states she is feeling lightheaded. When she walks she is kind of wobbly.   3. What is your BP issue? Elevated blood pressure

## 2022-03-08 NOTE — Telephone Encounter (Signed)
I returned call to patient.  She last took losartan 25 mg yesterday around 10 am and still this am she felt "terrible, wobbly, can't keep my balance".  Her BP was elevated per readings below and so she went back to amlodipine 2.5 mg.  Most recent reading was 117/75.  She is going to continue w amlodipine for now instead of losartan.  Medication list updated.  Keeping appointment w Dr. Clifton James 03/23/22 to discuss further because she feels she has developed s/e to amlodipine.

## 2022-03-15 DIAGNOSIS — E7800 Pure hypercholesterolemia, unspecified: Secondary | ICD-10-CM | POA: Diagnosis not present

## 2022-03-15 DIAGNOSIS — Z713 Dietary counseling and surveillance: Secondary | ICD-10-CM | POA: Diagnosis not present

## 2022-03-15 DIAGNOSIS — Z91013 Allergy to seafood: Secondary | ICD-10-CM | POA: Diagnosis not present

## 2022-03-15 DIAGNOSIS — K581 Irritable bowel syndrome with constipation: Secondary | ICD-10-CM | POA: Diagnosis not present

## 2022-03-15 DIAGNOSIS — I10 Essential (primary) hypertension: Secondary | ICD-10-CM | POA: Diagnosis not present

## 2022-03-15 DIAGNOSIS — H43812 Vitreous degeneration, left eye: Secondary | ICD-10-CM | POA: Diagnosis not present

## 2022-03-15 DIAGNOSIS — E049 Nontoxic goiter, unspecified: Secondary | ICD-10-CM | POA: Diagnosis not present

## 2022-03-23 ENCOUNTER — Encounter: Payer: Self-pay | Admitting: Cardiovascular Disease

## 2022-03-23 ENCOUNTER — Ambulatory Visit: Payer: Medicare PPO | Attending: Cardiovascular Disease | Admitting: Cardiovascular Disease

## 2022-03-23 VITALS — BP 124/70 | HR 64 | Ht 61.5 in | Wt 138.0 lb

## 2022-03-23 DIAGNOSIS — I10 Essential (primary) hypertension: Secondary | ICD-10-CM

## 2022-03-23 DIAGNOSIS — R002 Palpitations: Secondary | ICD-10-CM

## 2022-03-23 DIAGNOSIS — I201 Angina pectoris with documented spasm: Secondary | ICD-10-CM

## 2022-03-23 NOTE — Patient Instructions (Signed)
Medication Instructions:  No changes *If you need a refill on your cardiac medications before your next appointment, please call your pharmacy*   Lab Work: none If you have labs (blood work) drawn today and your tests are completely normal, you will receive your results only by: MyChart Message (if you have MyChart) OR A paper copy in the mail If you have any lab test that is abnormal or we need to change your treatment, we will call you to review the results.   Testing/Procedures: none   Follow-Up: At  [facility], you and your health needs are our priority.  As part of our continuing mission to provide you with exceptional heart care, we have created designated Provider Care Teams.  These Care Teams include your primary Cardiologist (physician) and Advanced Practice Providers (APPs -  Physician Assistants and Nurse Practitioners) who all work together to provide you with the care you need, when you need it.   Your next appointment:   12 month(s)  The format for your next appointment:   In Person  Provider:   Christopher McAlhany, MD     Important Information About Sugar       

## 2022-03-23 NOTE — Progress Notes (Signed)
r   Chief Complaint  Patient presents with   Follow-up    HTN   History of Present Illness: 66 yo female with history of chest pain, HTN, asthma, anxiety and suspicion for coronary vasospasm who is here today for cardiac follow up. She has had chronic chest pain with a normal cardiac cath in 2012 and again in 2014. 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 Echo January 2012 with normal LV function, no valve issues. She has been on Norvasc and has tolerated this well. I saw her in March 2019 and she c/o mild edema. I ordered an echo and started Lasix. She cancelled the echo. I saw her 06/15/20 and she was doing well with occasional LE edema. Echo was planned but not yet completed. I saw her in March 2022 and she c/o palpitations. Cardiac monitor April 2022 with sinus with PACs. She called our office in early November 2023 and reported that she wanted to stop Norvasc as she felt that she had been on it too long. She was started on Losartan but she felt weak on this. She restarted Norvasc 2.5 mg daily and her   She is here today for follow up. The patient denies any chest pain, dyspnea, palpitations, lower extremity edema, orthopnea, PND, dizziness, near syncope or syncope.    Primary Care Physician: Tally Joe, MD  Past Medical History:  Diagnosis Date   Allergy    Anemia    Anxiety    Asthma    H/O exercise stress test 2008; October 2014   was normal in 2008; positive for ischemia with inferior and lateral ST depressions October 2014   Hx of echocardiogram 091/2012   relatively normal as well. No significant valvar lesions. Normal Ef.   Hypertension    Lower extremity edema 05/18/2021   PRN Furosemide   Migraines    Palpitations     Past Surgical History:  Procedure Laterality Date   ABDOMINAL EXPLORATION SURGERY  1980   CARDIAC CATHETERIZATION  04/2010   with no evidence of ischemia or significant coronary disease to speak of, normal LV function with relatively normal EDP.   LEFT HEART  CATHETERIZATION WITH CORONARY ANGIOGRAM N/A 03/18/2013   Procedure: LEFT HEART CATHETERIZATION WITH CORONARY ANGIOGRAM;  Surgeon: Kathleene Hazel, MD;  Location: [facility] CATH LAB;  Service: Cardiovascular;  Laterality: N/A;   NASAL SINUS SURGERY      Current Outpatient Medications  Medication Sig Dispense Refill   acetaminophen (TYLENOL) 325 MG tablet Take 500 mg by mouth every 6 (six) hours as needed for mild pain.      albuterol (VENTOLIN HFA) 108 (90 Base) MCG/ACT inhaler Inhale 1-2 puffs into the lungs every 6 (six) hours as needed for wheezing or shortness of breath.     ALPRAZolam (XANAX) 0.5 MG tablet Take 0.5 mg by mouth as needed.     amLODipine (NORVASC) 2.5 MG tablet Take 1 tablet (2.5 mg total) by mouth 2 (two) times daily. 180 tablet 3   ascorbic acid (VITAMIN C) 500 MG tablet Take 500 mg by mouth daily.     AUVI-Q 0.3 MG/0.3ML SOAJ injection Inject 0.3 mLs (0.3 mg total) into the muscle as needed for anaphylaxis. 2 Device 1   cholecalciferol (VITAMIN D3) 25 MCG (1000 UNIT) tablet Take 1,000 Units by mouth daily.     fluticasone (FLONASE) 50 MCG/ACT nasal spray fluticasone propionate 50 mcg/actuation nasal spray,suspension     guaiFENesin (MUCINEX) 600 MG 12 hr tablet Take by mouth as needed.  hydrocortisone-pramoxine (PROCTOFOAM-HC) rectal foam Place 1 applicator rectally 2 (two) times daily. 30 g 0   ipratropium-albuterol (DUONEB) 0.5-2.5 (3) MG/3ML SOLN Take 3 mLs by nebulization every 4 (four) hours as needed. 3 mL 1   Multiple Vitamin (MULTIVITAMIN) capsule Take 1 capsule by mouth daily.      omeprazole (PRILOSEC) 40 MG capsule Take 1 capsule (40 mg total) by mouth daily. 30 capsule 1   SYMBICORT 160-4.5 MCG/ACT inhaler Inhale 2 puffs into the lungs 2 (two) times daily. 1 Inhaler 5   furosemide (LASIX) 20 MG tablet Take 1 tablet (20 mg total) by mouth daily as needed for fluid or edema. 30 tablet 0   No current facility-administered medications for this visit.     Allergies  Allergen Reactions   Codeine Other (See Comments)    Other reaction(s): Other (See Comments) Pounding in head  Pounding in head    Amoxicillin Other (See Comments)    Diarrhea and vomiting.  Has patient had a PCN reaction causing immediate rash, facial/tongue/throat swelling, SOB or lightheadedness with hypotension: No Has patient had a PCN reaction causing severe rash involving mucus membranes or skin necrosis: No Has patient had a PCN reaction that required hospitalization No Has patient had a PCN reaction occurring within the last 10 years: No If all of the above answers are "NO", then may proceed with Cephalosporin use.    Amoxicillin-Pot Clavulanate Other (See Comments)    Diarrhea and vomiting.    Bee Venom     Swelling   Elemental Sulfur    Latex Hives and Other (See Comments)   Metoprolol Other (See Comments)    Hair loss   Milk-Related Compounds     Diarrhea & gas   Shellfish Allergy     HIVES, SWELLING, N/V & DIARREHA   Sulfa Antibiotics    Sulfamethoxazole Other (See Comments)    Social History   Socioeconomic History   Marital status: Single    Spouse name: Not on file   Number of children: 0   Years of education: Not on file   Highest education level: Not on file  Occupational History   Occupation: Retired Chartered loss adjuster  Tobacco Use   Smoking status: Never   Smokeless tobacco: Never  Vaping Use   Vaping Use: Never used  Substance and Sexual Activity   Alcohol use: No    Alcohol/week: 0.0 standard drinks of alcohol   Drug use: No   Sexual activity: Not Currently  Other Topics Concern   Not on file  Social History Narrative   Single woman, who lives alone. 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   She does not smoke or drink alcohol.   She occasionally exercises walking on a treadmill.   Social Determinants of Health    Financial Resource Strain: Not on file  Food Insecurity: Not on file  Transportation Needs: Not on file  Physical Activity: Not on file  Stress: Not on file  Social Connections: Not on file  Intimate Partner Violence: Not on file    Family History  Problem Relation Age of Onset   Hypertension Mother    Asthma Mother    Anemia Mother    Hypertension Father    Stroke Father    Hypertension Sister    Atrial fibrillation Brother    Stroke Brother    Hypertension Brother    Stroke Maternal  Grandmother    Diabetes Maternal Grandmother    Cancer Maternal Grandfather    Diabetes Paternal Grandmother    Heart attack Paternal Grandfather    Cancer Paternal Grandfather    Diabetes Paternal Grandfather    Hypertension Other    Colon cancer Neg Hx    Esophageal cancer Neg Hx    Inflammatory bowel disease Neg Hx    Liver disease Neg Hx    Pancreatic cancer Neg Hx    Rectal cancer Neg Hx     Review of Systems:  As stated in the HPI and otherwise negative.   BP 124/70   Pulse 64   Ht 5' 1.5" (1.562 m)   Wt 138 lb (62.6 kg)   LMP 11/16/2009   SpO2 98%   BMI 25.65 kg/m   Physical Examination:  General: Well developed, well nourished, NAD  HEENT: OP clear, mucus membranes moist  SKIN: warm, dry. No rashes. Neuro: No focal deficits  Musculoskeletal: Muscle strength 5/5 all ext  Psychiatric: Mood and affect normal  Neck: No JVD, no carotid bruits, no thyromegaly, no lymphadenopathy.  Lungs:Clear bilaterally, no wheezes, rhonci, crackles Cardiovascular: Regular rate and rhythm. No murmurs, gallops or rubs. Abdomen:Soft. Bowel sounds present. Non-tender.  Extremities: No lower extremity edema. Pulses are 2 + in the bilateral DP/PT.  Cardiac cath 03/18/13: Left main: No obstructive disease.  Left Anterior Descending Artery: Large caliber vessel that courses to the apex. There are several small caliber diagonal branches. No obstructive disease.  Circumflex Artery: Large  caliber vessel with large obtuse marginal branch. No obstructive disease.  Right Coronary Artery: Large dominant vessel with no obstructive disease.  Left Ventricular Angiogram: LVEF=65%.  Impression:  1. No angiographic evidence of CAD  2. Normal LV systolic function  3. Chest pain, cannot fully exclude coronary vasospasm with ST depression on treadmill exercise test.   EKG:  EKG is not ordered today. 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 Coronary CT calcium score of zero in March 2023. No chest pain. Continue Norvasc.   2. HTN: BP is well controlled. Continue Norvasc 2.5 mg daily  3. Lower extremity edema: No LE edema. Lasix prn.   4. Palpitations/PACs: No recent palpitations.   Labs/ tests ordered today include:  No orders of the defined types were placed in this encounter.  Disposition:   F/U with me in 12  months  Signed, Verne Carrow, MD 03/23/2022 3:12 PM    [facility] [address] [place], [place], [place]  [address] Phone: [phone]; Fax: [phone]

## 2022-03-25 DIAGNOSIS — R300 Dysuria: Secondary | ICD-10-CM | POA: Diagnosis not present

## 2022-03-25 DIAGNOSIS — R109 Unspecified abdominal pain: Secondary | ICD-10-CM | POA: Diagnosis not present

## 2022-04-01 DIAGNOSIS — F4322 Adjustment disorder with anxiety: Secondary | ICD-10-CM | POA: Diagnosis not present

## 2022-04-20 DIAGNOSIS — R42 Dizziness and giddiness: Secondary | ICD-10-CM | POA: Diagnosis not present

## 2022-04-20 DIAGNOSIS — F419 Anxiety disorder, unspecified: Secondary | ICD-10-CM | POA: Diagnosis not present

## 2022-04-20 DIAGNOSIS — R194 Change in bowel habit: Secondary | ICD-10-CM | POA: Diagnosis not present

## 2022-04-20 DIAGNOSIS — R0981 Nasal congestion: Secondary | ICD-10-CM | POA: Diagnosis not present

## 2022-04-20 DIAGNOSIS — E049 Nontoxic goiter, unspecified: Secondary | ICD-10-CM | POA: Diagnosis not present

## 2022-04-20 DIAGNOSIS — K581 Irritable bowel syndrome with constipation: Secondary | ICD-10-CM | POA: Diagnosis not present

## 2022-04-20 DIAGNOSIS — I10 Essential (primary) hypertension: Secondary | ICD-10-CM | POA: Diagnosis not present

## 2022-05-03 ENCOUNTER — Ambulatory Visit: Payer: Medicare PPO | Admitting: Allergy and Immunology

## 2022-05-10 DIAGNOSIS — H43812 Vitreous degeneration, left eye: Secondary | ICD-10-CM | POA: Diagnosis not present

## 2022-05-11 DIAGNOSIS — H9311 Tinnitus, right ear: Secondary | ICD-10-CM | POA: Diagnosis not present

## 2022-05-11 DIAGNOSIS — H9191 Unspecified hearing loss, right ear: Secondary | ICD-10-CM | POA: Diagnosis not present

## 2022-05-11 DIAGNOSIS — R42 Dizziness and giddiness: Secondary | ICD-10-CM | POA: Diagnosis not present

## 2022-05-11 DIAGNOSIS — F4322 Adjustment disorder with anxiety: Secondary | ICD-10-CM | POA: Diagnosis not present

## 2022-05-11 DIAGNOSIS — F419 Anxiety disorder, unspecified: Secondary | ICD-10-CM | POA: Diagnosis not present

## 2022-05-19 DIAGNOSIS — F4322 Adjustment disorder with anxiety: Secondary | ICD-10-CM | POA: Diagnosis not present

## 2022-05-27 DIAGNOSIS — R1012 Left upper quadrant pain: Secondary | ICD-10-CM | POA: Diagnosis not present

## 2022-05-27 DIAGNOSIS — R140 Abdominal distension (gaseous): Secondary | ICD-10-CM | POA: Diagnosis not present

## 2022-06-01 DIAGNOSIS — F4322 Adjustment disorder with anxiety: Secondary | ICD-10-CM | POA: Diagnosis not present

## 2022-06-16 DIAGNOSIS — H903 Sensorineural hearing loss, bilateral: Secondary | ICD-10-CM | POA: Diagnosis not present

## 2022-06-16 DIAGNOSIS — G43909 Migraine, unspecified, not intractable, without status migrainosus: Secondary | ICD-10-CM | POA: Diagnosis not present

## 2022-06-16 DIAGNOSIS — L298 Other pruritus: Secondary | ICD-10-CM | POA: Diagnosis not present

## 2022-06-16 DIAGNOSIS — M26629 Arthralgia of temporomandibular joint, unspecified side: Secondary | ICD-10-CM | POA: Diagnosis not present

## 2022-06-17 ENCOUNTER — Telehealth: Payer: Self-pay | Admitting: Cardiovascular Disease

## 2022-06-17 NOTE — Telephone Encounter (Signed)
Called patient to better evaluate concerns about lightheadedness, palpitations and recent episode speech disturbance.   Patient states she has had intermittent lightheadedness for 1 month, but "thumping in her chest" has been happing occasionally for the last 24 hours. She states she has had several instances where she thought she was going to fall so she cut her amlodipine tablets (2.5 mg BID) in half. She states her blood pressures were as follows for the past few days:  06/17/22:  126/70 HR 66  06/16/22: 136/75 HR 56 06/15/22: 124/78 HR 69 06/14/22: 128/79 HR 71  Patient states she started tracking her blood pressures after she had an episode on 06/14/22 where she was at work and several clients mentioned that she seemed to suddenly stop talking and walk away. Patient states she has no recollection of this and no idea how long this episode happened. Patient denies any hemispheric weakness, confusion or garbled speech. She also denies any balance concerns outside of when she feels lightheadedness. Forwarded to Dr. Angelena Form for advice. Advised patient to report to ED immediately if she has any falls, loss of consciousness or stroke symptoms, patient verbalizes understanding.

## 2022-06-17 NOTE — Telephone Encounter (Signed)
Pt c/o Syncope: STAT if syncope occurred within 30 minutes and pt complains of lightheadedness High Priority if episode of passing out, completely, today or in last 24 hours   Did you pass out today?   No   When is the last time you passed out?   Few minutes ago felt lightheaded   Has this occurred multiple times?   Yes   Did you have any symptoms prior to passing out?  Patient stated others noted she started speaking and stopped speaking and walked away    Patient is concerned regarding feeling lightheaded as though she is going to fall.  Patient stated the lightheadedness comes and goes.  Patient stated she has been taking 1/2 of amLODipine (NORVASC) 2.5 MG tablet .  Patient stated she has also been having high BP reading and her HR is often in to 50's.

## 2022-06-17 NOTE — Telephone Encounter (Signed)
Discussed with Dr. Angelena Form, patient scheduled for appointment 06/28/22.

## 2022-06-24 DIAGNOSIS — F4322 Adjustment disorder with anxiety: Secondary | ICD-10-CM | POA: Diagnosis not present

## 2022-06-28 ENCOUNTER — Ambulatory Visit: Payer: Medicare PPO | Admitting: Cardiovascular Disease

## 2022-07-07 DIAGNOSIS — F4322 Adjustment disorder with anxiety: Secondary | ICD-10-CM | POA: Diagnosis not present

## 2022-07-08 DIAGNOSIS — J302 Other seasonal allergic rhinitis: Secondary | ICD-10-CM | POA: Diagnosis not present

## 2022-07-08 DIAGNOSIS — N289 Disorder of kidney and ureter, unspecified: Secondary | ICD-10-CM | POA: Diagnosis not present

## 2022-07-08 DIAGNOSIS — I10 Essential (primary) hypertension: Secondary | ICD-10-CM | POA: Diagnosis not present

## 2022-07-08 DIAGNOSIS — G4700 Insomnia, unspecified: Secondary | ICD-10-CM | POA: Diagnosis not present

## 2022-07-08 DIAGNOSIS — Z8639 Personal history of other endocrine, nutritional and metabolic disease: Secondary | ICD-10-CM | POA: Diagnosis not present

## 2022-07-08 DIAGNOSIS — F419 Anxiety disorder, unspecified: Secondary | ICD-10-CM | POA: Diagnosis not present

## 2022-07-08 DIAGNOSIS — E049 Nontoxic goiter, unspecified: Secondary | ICD-10-CM | POA: Diagnosis not present

## 2022-07-08 DIAGNOSIS — E7800 Pure hypercholesterolemia, unspecified: Secondary | ICD-10-CM | POA: Diagnosis not present

## 2022-07-08 DIAGNOSIS — R42 Dizziness and giddiness: Secondary | ICD-10-CM | POA: Diagnosis not present

## 2022-07-14 ENCOUNTER — Ambulatory Visit: Payer: Medicare PPO | Admitting: Psychiatry

## 2022-07-14 ENCOUNTER — Encounter: Payer: Self-pay | Admitting: Psychiatry

## 2022-07-14 ENCOUNTER — Telehealth: Payer: Self-pay | Admitting: Psychiatry

## 2022-07-14 VITALS — BP 128/74 | HR 63 | Ht 61.5 in | Wt 148.4 lb

## 2022-07-14 DIAGNOSIS — R42 Dizziness and giddiness: Secondary | ICD-10-CM | POA: Diagnosis not present

## 2022-07-14 DIAGNOSIS — H9191 Unspecified hearing loss, right ear: Secondary | ICD-10-CM

## 2022-07-14 DIAGNOSIS — R2689 Other abnormalities of gait and mobility: Secondary | ICD-10-CM

## 2022-07-14 DIAGNOSIS — R200 Anesthesia of skin: Secondary | ICD-10-CM | POA: Diagnosis not present

## 2022-07-14 MED ORDER — DULOXETINE HCL 20 MG PO CPEP: 20.0000 mg | ORAL_CAPSULE | Freq: Every day | ORAL | 6 refills | Status: DC

## 2022-07-14 NOTE — Progress Notes (Signed)
[facility]  PATIENT: Bianca [name] DOB: Sep 20, 1955  REFERRING CLINICIAN: Antony Contras, MD HISTORY FROM: self REASON FOR VISIT: imbalance   HISTORICAL  CHIEF COMPLAINT:  Chief Complaint  Patient presents with   New Patient (Initial Visit)    RM 2,alone. Paper referral for disequilibrium. Pt reports light headedness. Does not have spinning sensation, feels off balance. Sx intermittent for past couple months. Has had no falls. First time sx have lasted this long. Wears glasses. Has floater in left eye and eye doctor told her she has cataract in eye. Dr Peter Garter dx w/ cataract. Went to [facility]. They found the new floater. Told her not to be concerned about this.     HISTORY OF PRESENT ILLNESS:  The patient presents for evaluation of disequilibrium over the past couple of months. This has occurred in the past as well but it has never lasted this long before. States she feels lightheaded and "woozy".  Does not endorse any spinning sensations. States it feels like her previous hypoglycemia episodes or when she would have a heavy menstrual cycle. These episodes are associated with palpitations. This occurs more frequently when she is stressed or tired. Can be triggered by looking down for a prolonged period of time. Sometimes feels like she is about to fall forward when she is walking. Can occur when she is sitting or lying down as well. Feels pressure in her right ear and has decreased hearing on that side.  She has also started seeing flashes and squiggly lines in her left eye. Saw the eye doctor and was told she had a new floater and a cataract, but exam was otherwise normal. Has a history of migraine headaches, but has not had any headaches recently.   States her TSH was on the low end of normal and hematocrit was low on most recent blood work.  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 They did not identify any cause for her  disequilibrium.   Tried increasing her fluoxetine to see if this is anxiety related, but believes this made her more dizzy. She would like to switch to a different antidepressant as she does not feel fluoxetine has been effective.   OTHER MEDICAL CONDITIONS: HTN, anxiety, HLD, asthma   REVIEW OF SYSTEMS: Full 14 system review of systems performed and negative with exception of: imbalance  ALLERGIES: Allergies  Allergen Reactions   Codeine Other (See Comments)    Other reaction(s): Other (See Comments) Pounding in head  Pounding in head    Amoxicillin Other (See Comments)    Diarrhea and vomiting.  Has patient had a PCN reaction causing immediate rash, facial/tongue/throat swelling, SOB or lightheadedness with hypotension: No Has patient had a PCN reaction causing severe rash involving mucus membranes or skin necrosis: No Has patient had a PCN reaction that required hospitalization No Has patient had a PCN reaction occurring within the last 10 years: No If all of the above answers are "NO", then may proceed with Cephalosporin use.    Amoxicillin-Pot Clavulanate Other (See Comments)    Diarrhea and vomiting.    Bee Venom     Swelling   Elemental Sulfur    Latex Hives and Other (See Comments)   Metoprolol Other (See Comments)    Hair loss   Milk-Related Compounds     Diarrhea & gas   Shellfish Allergy     HIVES, SWELLING, N/V & DIARREHA   Sulfa Antibiotics  Sulfamethoxazole Other (See Comments)    HOME MEDICATIONS: Outpatient Medications Prior to Visit  Medication Sig Dispense Refill   acetaminophen (TYLENOL) 325 MG tablet Take 500 mg by mouth every 6 (six) hours as needed for mild pain.      albuterol (VENTOLIN HFA) 108 (90 Base) MCG/ACT inhaler Inhale 1-2 puffs into the lungs every 6 (six) hours as needed for wheezing or shortness of breath.     ALPRAZolam (XANAX) 0.5 MG tablet Take 0.5 mg by mouth as needed.     amLODipine (NORVASC) 2.5 MG tablet Take 1 tablet (2.5 mg  total) by mouth 2 (two) times daily. 180 tablet 3   ascorbic acid (VITAMIN C) 500 MG tablet Take 500 mg by mouth daily.     AUVI-Q 0.3 MG/0.3ML SOAJ injection Inject 0.3 mLs (0.3 mg total) into the muscle as needed for anaphylaxis. 2 Device 1   cholecalciferol (VITAMIN D3) 25 MCG (1000 UNIT) tablet Take 1,000 Units by mouth daily.     fluticasone (FLONASE) 50 MCG/ACT nasal spray Place 1 spray into both nostrils as needed for allergies.     guaiFENesin (MUCINEX) 600 MG 12 hr tablet Take by mouth as needed.      hydrocortisone-pramoxine (PROCTOFOAM-HC) rectal foam Place 1 applicator rectally 2 (two) times daily. 30 g 0   ipratropium-albuterol (DUONEB) 0.5-2.5 (3) MG/3ML SOLN Take 3 mLs by nebulization every 4 (four) hours as needed. 3 mL 1   Multiple Vitamin (MULTIVITAMIN) capsule Take 1 capsule by mouth daily.      SYMBICORT 160-4.5 MCG/ACT inhaler Inhale 2 puffs into the lungs 2 (two) times daily. (Patient taking differently: Inhale 2 puffs into the lungs as needed.) 1 Inhaler 5   omeprazole (PRILOSEC) 40 MG capsule Take 1 capsule (40 mg total) by mouth daily. 30 capsule 1   furosemide (LASIX) 20 MG tablet Take 1 tablet (20 mg total) by mouth daily as needed for fluid or edema. 30 tablet 0   No facility-administered medications prior to visit.    PAST MEDICAL HISTORY: Past Medical History:  Diagnosis Date   Allergy    Anemia    Anxiety    Asthma    H/O exercise stress test 2008; October 2014   was normal in 2008; positive for ischemia with inferior and lateral ST depressions October 2014   Hx of echocardiogram 091/2012   relatively normal as well. No significant valvar lesions. Normal Ef.   Hypertension    Lower extremity edema 05/18/2021   PRN Furosemide   Migraines    Palpitations     PAST SURGICAL HISTORY: Past Surgical History:  Procedure Laterality Date   ABDOMINAL EXPLORATION SURGERY  1980   CARDIAC CATHETERIZATION  04/2010   with no evidence of ischemia or significant  coronary disease to speak of, normal LV function with relatively normal EDP.   LEFT HEART CATHETERIZATION WITH CORONARY ANGIOGRAM N/A 03/18/2013   Procedure: LEFT HEART CATHETERIZATION WITH CORONARY ANGIOGRAM;  Surgeon: Burnell Blanks, MD;  Location: [facility] CATH LAB;  Service: Cardiovascular;  Laterality: N/A;   NASAL SINUS SURGERY      FAMILY HISTORY: Family History  Problem Relation Age of Onset   Hypertension Mother    Asthma Mother    Anemia Mother    Hypertension Father    Stroke Father    Hypertension Sister    Atrial fibrillation Brother    Stroke Brother    Hypertension Brother    Stroke Maternal Grandmother    Diabetes Maternal Grandmother  Cancer Maternal Grandfather    Diabetes Paternal Grandmother    Heart attack Paternal Grandfather    Cancer Paternal Grandfather    Diabetes Paternal Grandfather    Hypertension Other    Colon cancer Neg Hx    Esophageal cancer Neg Hx    Inflammatory bowel disease Neg Hx    Liver disease Neg Hx    Pancreatic cancer Neg Hx    Rectal cancer Neg Hx     SOCIAL HISTORY: Social History   Socioeconomic History   Marital status: Single    Spouse name: Not on file   Number of children: 0   Years of education: 16   Highest education level: Not on file  Occupational History   Occupation: Retired Radio producer  Tobacco Use   Smoking status: Never   Smokeless tobacco: Never  Vaping Use   Vaping Use: Never used  Substance and Sexual Activity   Alcohol use: No    Alcohol/week: 0.0 standard drinks of alcohol   Drug use: No   Sexual activity: Not Currently  Other Topics Concern   Not on file  Social History Narrative   Right handed   Caffeine use: once per week or less, drinks mostly water   Single woman, who lives alone. 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   Social Determinants of Health   Financial Resource Strain: Not on file  Food Insecurity: Not on file  Transportation Needs: Not on file  Physical Activity: Not on file  Stress: Not on file  Social Connections: Not on file  Intimate Partner Violence: Not on file     PHYSICAL EXAM  GENERAL EXAM/CONSTITUTIONAL: Vitals:  Vitals:   07/14/22 1432  BP: 128/74  Pulse: 63  Weight: 148 lb 6.4 oz (67.3 kg)  Height: 5' 1.5" (1.562 m)   Body mass index is 27.59 kg/m. Wt Readings from Last 3 Encounters:  07/14/22 148 lb 6.4 oz (67.3 kg)  03/23/22 138 lb (62.6 kg)  09/15/21 152 lb 3.2 oz (69 kg)    NEUROLOGIC: MENTAL STATUS:  awake, alert, oriented to person, place and time recent and remote memory intact normal attention and concentration  CRANIAL NERVE:  2nd, 3rd, 4th, 6th - pupils equal and reactive to light, visual fields full to confrontation, extraocular muscles intact, no nystagmus 5th - facial sensation symmetric 7th - facial strength symmetric 8th - hearing intact 9th - palate elevates symmetrically, uvula midline 11th - shoulder shrug symmetric 12th - tongue protrusion midline  MOTOR:  normal bulk and tone, full strength in the BUE, BLE  SENSORY:  Decreased sensation to pinprick in right toes, decreased sensation to vibration bilateral feet  COORDINATION:  finger-nose-finger and heel-to-shin normal  REFLEXES:  deep tendon reflexes present and symmetric  GAIT/STATION:  Normal-based gait and tandem gait, Romberg induces dizziness but she does not fall     DIAGNOSTIC DATA (LABS, IMAGING, TESTING) - I reviewed patient records, labs, notes, testing and imaging myself where available.  Lab Results  Component Value Date   WBC 7.0 01/20/2022   HGB 13.0 01/20/2022   HCT 35.9 (L) 01/20/2022   MCV 82.0 01/20/2022   PLT 404 (H) 01/20/2022      Component Value Date/Time   NA 138 01/20/2022 1411   K 3.5 01/20/2022 1411    CL 103  01/20/2022 1411   CO2 24 01/20/2022 1411   GLUCOSE 86 01/20/2022 1411   BUN 8 01/20/2022 1411   CREATININE 0.74 01/20/2022 1411   CREATININE 0.88 03/13/2013 1638   CALCIUM 10.3 01/20/2022 1411   PROT 7.2 01/20/2022 1411   ALBUMIN 4.0 01/20/2022 1411   AST 30 01/20/2022 1411   ALT 21 01/20/2022 1411   ALKPHOS 65 01/20/2022 1411   BILITOT 0.7 01/20/2022 1411   GFRNONAA >60 01/20/2022 1411   GFRAA >60 10/15/2015 1433   Lab Results  Component Value Date   CHOL 207 (H) 05/21/2021   HDL 73 05/21/2021   LDLCALC 126 (H) 05/21/2021   TRIG 45 05/21/2021   CHOLHDL 2.8 05/21/2021   No results found for: "HGBA1C" No results found for: "VITAMINB12" Lab Results  Component Value Date   TSH 0.68 03/19/2021     ASSESSMENT AND PLAN  67 y.o. year old female with a history of HTN, anxiety, HLD, asthma who presents for evaluation of dizziness and imbalance. She denies true vertigo, feels more like lightheadedness and wooziness. Exam reveals decreased sensation to pinprick and vibration in her feet. Will check blood work for causes of neuropathy and imbalance. MRI brain/IAC ordered as she also reports decreased hearing in her right ear with normal ENT exam. Though she has not had headaches, there may be a possible migraine component as she has a history of migraines and has developed new floaters and flashes of lights in her vision. She would like to switch her antidepressant. Will transition to Cymbalta as this can also treat migraine.   1. Numbness   2. Dizziness   3. Hearing loss of right ear, unspecified hearing loss type       PLAN: -MRI brain/IAC -Blood work: B12, A1c, myeloma, iron levels -Decrease fluoxetine to 10 mg daily x1 week, then start Cymbalta 20 mg daily  Orders Placed This Encounter  Procedures   MR BRAIN/IAC W WO CONTRAST   Hemoglobin A1c   Vitamin B12   Multiple Myeloma Panel (SPEP&IFE w/QIG)   Iron, TIBC and Ferritin Panel    Meds ordered this  encounter  Medications   DULoxetine (CYMBALTA) 20 MG capsule    Sig: Take 1 capsule (20 mg total) by mouth daily.    Dispense:  30 capsule    Refill:  6    Return in about 8 months (around 03/16/2023).    Genia Harold, MD 07/14/22 3:35 PM  I spent an average of 50 minutes chart reviewing and counseling the patient, with at least 50% of the time face to face with the patient.   [facility] [address] [place], [place] [address] [phone]

## 2022-07-14 NOTE — Patient Instructions (Addendum)
Plan:  Decrease fluoxetine to 10 mg daily for 1 week, then stop this and start Cymbalta 20 mg daily  Blood work: A1c (blood sugar), B12, myeloma panel, iron levels  MRI of the brain

## 2022-07-14 NOTE — Telephone Encounter (Signed)
Craig Staggers: [id number] exp. 07/14/22-08/13/22 sent to GI [id number]

## 2022-07-18 ENCOUNTER — Encounter: Payer: Self-pay | Admitting: Psychiatry

## 2022-07-19 DIAGNOSIS — Z1231 Encounter for screening mammogram for malignant neoplasm of breast: Secondary | ICD-10-CM | POA: Diagnosis not present

## 2022-07-19 DIAGNOSIS — K649 Unspecified hemorrhoids: Secondary | ICD-10-CM | POA: Diagnosis not present

## 2022-07-20 LAB — IRON,TIBC AND FERRITIN PANEL
Ferritin: 367 ng/mL — ABNORMAL HIGH (ref 15–150)
Iron Saturation: 18 % (ref 15–55)
Iron: 56 ug/dL (ref 27–139)
Total Iron Binding Capacity: 304 ug/dL (ref 250–450)
UIBC: 248 ug/dL (ref 118–369)

## 2022-07-20 LAB — MULTIPLE MYELOMA PANEL, SERUM
Albumin SerPl Elph-Mcnc: 4.2 g/dL (ref 2.9–4.4)
Albumin/Glob SerPl: 1.5 (ref 0.7–1.7)
Alpha 1: 0.2 g/dL (ref 0.0–0.4)
Alpha2 Glob SerPl Elph-Mcnc: 0.6 g/dL (ref 0.4–1.0)
B-Globulin SerPl Elph-Mcnc: 1 g/dL (ref 0.7–1.3)
Gamma Glob SerPl Elph-Mcnc: 1.2 g/dL (ref 0.4–1.8)
Globulin, Total: 3 g/dL (ref 2.2–3.9)
IgA/Immunoglobulin A, Serum: 250 mg/dL (ref 87–352)
IgG (Immunoglobin G), Serum: 1421 mg/dL (ref 586–1602)
IgM (Immunoglobulin M), Srm: 60 mg/dL (ref 26–217)
Total Protein: 7.2 g/dL (ref 6.0–8.5)

## 2022-07-20 LAB — HEMOGLOBIN A1C
Est. average glucose Bld gHb Est-mCnc: 103 mg/dL
Hgb A1c MFr Bld: 5.2 % (ref 4.8–5.6)

## 2022-07-20 LAB — VITAMIN B12: Vitamin B-12: >2000 pg/mL — ABNORMAL HIGH (ref 232–1245)

## 2022-07-22 DIAGNOSIS — R140 Abdominal distension (gaseous): Secondary | ICD-10-CM | POA: Diagnosis not present

## 2022-07-27 ENCOUNTER — Ambulatory Visit: Payer: Medicare PPO | Attending: Nurse Practitioner | Admitting: Nurse Practitioner

## 2022-07-27 ENCOUNTER — Encounter: Payer: Self-pay | Admitting: Nurse Practitioner

## 2022-07-27 ENCOUNTER — Ambulatory Visit (INDEPENDENT_AMBULATORY_CARE_PROVIDER_SITE_OTHER): Payer: Medicare PPO

## 2022-07-27 VITALS — BP 106/70 | HR 72 | Ht 61.5 in | Wt 150.2 lb

## 2022-07-27 DIAGNOSIS — I201 Angina pectoris with documented spasm: Secondary | ICD-10-CM

## 2022-07-27 DIAGNOSIS — I10 Essential (primary) hypertension: Secondary | ICD-10-CM

## 2022-07-27 DIAGNOSIS — R002 Palpitations: Secondary | ICD-10-CM

## 2022-07-27 DIAGNOSIS — R42 Dizziness and giddiness: Secondary | ICD-10-CM

## 2022-07-27 NOTE — Patient Instructions (Signed)
Medication Instructions:   Your physician recommends that you continue on your current medications as directed. Please refer to the Current Medication list given to you today.   *If you need a refill on your cardiac medications before your next appointment, please call your pharmacy*   Lab Work:  None ordered.  If you have labs (blood work) drawn today and your tests are completely normal, you will receive your results only by: [place] (if you have MyChart) OR A paper copy in the mail If you have any lab test that is abnormal or we need to change your treatment, we will call you to review the results.   Testing/Procedures:  Bryn Gulling- Long Term Monitor Instructions  Your physician has requested you wear a ZIO patch monitor for 14 days.  This is a single patch monitor. [employer] supplies one patch monitor per enrollment. Additional stickers are not available. Please do not apply patch if you will be having a Nuclear Stress Test,  Echocardiogram, Cardiac CT, MRI, or Chest Xray during the period you would be wearing the  monitor. The patch cannot be worn during these tests. You cannot remove and re-apply the  ZIO XT patch monitor.  Your ZIO patch monitor will be mailed 3 day USPS to your address on file. It may take 3-5 days  to receive your monitor after you have been enrolled.  Once you have received your monitor, please review the enclosed instructions. Your monitor  has already been registered assigning a specific monitor serial # to you.  Billing and Patient Assistance Program Information  We have supplied [employer] with any of your insurance information on file for billing purposes. [employer] offers a sliding scale Patient Assistance Program for patients that do not have  insurance, or whose insurance does not completely cover the cost of the ZIO monitor.  You must apply for the Patient Assistance Program to qualify for this discounted rate.  To apply, please call [employer] at  [phone], select option 4, select option 2, ask to apply for  Patient Assistance Program. Theodore Demark will ask your household income, and how many people  are in your household. They will quote your out-of-pocket cost based on that information.  [employer] will also be able to set up a 62-month, interest-free payment plan if needed.  Applying the monitor   Shave hair from upper left chest.  Hold abrader disc by orange tab. Rub abrader in 40 strokes over the upper left chest as  indicated in your monitor instructions.  Clean area with 4 enclosed alcohol pads. Let dry.  Apply patch as indicated in monitor instructions. Patch will be placed under collarbone on left  side of chest with arrow pointing upward.  Rub patch adhesive wings for 2 minutes. Remove white label marked "1". Remove the white  label marked "2". Rub patch adhesive wings for 2 additional minutes.  While looking in a mirror, press and release button in center of patch. A small green light will  flash 3-4 times. This will be your only indicator that the monitor has been turned on.  Do not shower for the first 24 hours. You may shower after the first 24 hours.  Press the button if you feel a symptom. You will hear a small click. Record Date, Time and  Symptom in the Patient Logbook.  When you are ready to remove the patch, follow instructions on the last 2 pages of Patient  Logbook. Stick patch monitor onto the last page of Patient  Logbook.  Place Patient Logbook in the blue and white box. Use locking tab on box and tape box closed  securely. The blue and white box has prepaid postage on it. Please place it in the mailbox as  soon as possible. Your physician should have your test results approximately 7 days after the  monitor has been mailed back to [facility].  Call [facility] Customer Care at [phone] if you have questions regarding  your ZIO XT patch monitor. Call them immediately if you see an orange light  blinking on your  monitor.  If your monitor falls off in less than 4 days, contact our Monitor department at [phone].  If your monitor becomes loose or falls off after 4 days call [employer] at [phone] for  suggestions on securing your monitor    Follow-Up: At [facility], you and your health needs are our priority.  As part of our continuing mission to provide you with exceptional heart care, we have created designated Provider Care Teams.  These Care Teams include your primary Cardiologist (physician) and Advanced Practice Providers (APPs -  Physician Assistants and Nurse Practitioners) who all work together to provide you with the care you need, when you need it.  We recommend signing up for the patient portal called "MyChart".  Sign up information is provided on this After Visit Summary.  MyChart is used to connect with patients for Virtual Visits (Telemedicine).  Patients are able to view lab/test results, encounter notes, upcoming appointments, etc.  Non-urgent messages can be sent to your provider as well.   To learn more about what you can do with MyChart, go to [email or web address].    Your next appointment:   6 week(s)  Provider:   Eligha Bridegroom, NP

## 2022-07-27 NOTE — Progress Notes (Unsigned)
Cardiology Office Note:    Date:  07/28/2022   ID:  Bianca [name], DOB [birth year], MRN [id number]  PCP:  Tally Joe, MD   [facility] Providers Cardiologist:  Verne Carrow, MD     Referring MD: Tally Joe, MD   Chief Complaint: lightheadedness  History of Present Illness:    Bianca [name] is a pleasant 67 y.o. female with a hx of chest pain, HTN, asthma, anxiety, and suspicion for coronary vasospasm.  Chronic chest pain with normal cardiac cath in 2012 and again in 2014.  Coronary vasospasm was suspected.  Echo January 2012 with normal LV function, no valve issues.  She has been on amlodipine and has tolerated this well.  Seen in March 2019 with mild edema.  Seen by Dr. Clifton James, echo ordered and Lasix was started.  She canceled the echo.  Seen again in follow-up 06/15/2020 at which time she reported doing well with occasional LE edema.  March 2022 she reported palpitations.  Cardiac monitor April 2022 sinus with PACs.  She had coronary CT calcium score of 0 March 2023.  She contacted our office November 2023 and reported she wanted to stop amlodipine as she had been on it too long.  She was started on losartan but felt weak.  She restarted amlodipine 2.5 mg daily.  Seen in follow-up 03/23/2022 by Dr. Clifton James.  He was well-controlled on amlodipine 2.5 mg daily.  She had no specific cardiac complaints and 1 year follow-up was recommended.  She contacted our office 06/17/2022 to report intermittent lightheadedness for 1 month and thumping in her chest.  She had several instances where she thought she was going to fall so she cut her amlodipine 2.5 mg tabs in half.  She was scheduled for an appointment on 06/28/2022 but there is no record that she came in.  Today, she is here for evaluation of lightheadedness. Occurs more often in the evenings recently. Notes pulse is in the 50s at times, at other times it feels like it speeds up and is pounding in her chest. Also feels  "weakness" in her chest at times. On one recent occasion, was sitting eating lunch, and felt her heart pounding and felt lightheaded. Symptoms lasted 1-2 minutes. Did not feel stressed at that time or have any other factors that should have made her feel that way. Referred to neurology and ENT by PCP for evaluation of lightheadedness with no obvious sources identified. Has MRI of head pending. Does not ever feel symptom of room spinning. Has been changed from fluoxetine to duloxetine with weaning schedule given to her by neurologist. Has noted that symptoms frequently worsen at 7pm, wonders if it is fatigue. Between May and Oct 2023 lost 30 lbs. Was experiencing GI symptoms and anxiety. Probably ate less secondary to stress. Walks on treadmill 15-20 minutes several days per week. Does not feel like she can walk farther due to weakness or left arm pain. No presyncope, syncope, orthopnea, edema, or PND.    Past Medical History:  Diagnosis Date   Allergy    Anemia    Anxiety    Asthma    H/O exercise stress test 2008; October 2014   was normal in 2008; positive for ischemia with inferior and lateral ST depressions October 2014   Hx of echocardiogram 091/2012   relatively normal as well. No significant valvar lesions. Normal Ef.   Hypertension    Lower extremity edema 05/18/2021   PRN Furosemide   Migraines  Palpitations     Past Surgical History:  Procedure Laterality Date   ABDOMINAL EXPLORATION SURGERY  1980   CARDIAC CATHETERIZATION  04/2010   with no evidence of ischemia or significant coronary disease to speak of, normal LV function with relatively normal EDP.   LEFT HEART CATHETERIZATION WITH CORONARY ANGIOGRAM N/A 03/18/2013   Procedure: LEFT HEART CATHETERIZATION WITH CORONARY ANGIOGRAM;  Surgeon: Kathleene Hazel, MD;  Location: [facility] CATH LAB;  Service: Cardiovascular;  Laterality: N/A;   NASAL SINUS SURGERY      Current Medications: Current Meds  Medication Sig    acetaminophen (TYLENOL) 325 MG tablet Take 500 mg by mouth every 6 (six) hours as needed for mild pain.    albuterol (VENTOLIN HFA) 108 (90 Base) MCG/ACT inhaler Inhale 1-2 puffs into the lungs every 6 (six) hours as needed for wheezing or shortness of breath.   ALPRAZolam (XANAX) 0.5 MG tablet Take 0.5 mg by mouth as needed.   amLODipine (NORVASC) 2.5 MG tablet Take 1 tablet (2.5 mg total) by mouth 2 (two) times daily.   ascorbic acid (VITAMIN C) 500 MG tablet Take 500 mg by mouth daily.   AUVI-Q 0.3 MG/0.3ML SOAJ injection Inject 0.3 mLs (0.3 mg total) into the muscle as needed for anaphylaxis.   cholecalciferol (VITAMIN D3) 25 MCG (1000 UNIT) tablet Take 1,000 Units by mouth daily.   DULoxetine (CYMBALTA) 20 MG capsule Take 1 capsule (20 mg total) by mouth daily.   fluticasone (FLONASE) 50 MCG/ACT nasal spray Place 1 spray into both nostrils as needed for allergies.   guaiFENesin (MUCINEX) 600 MG 12 hr tablet Take by mouth as needed.    hydrocortisone-pramoxine (PROCTOFOAM-HC) rectal foam Place 1 applicator rectally 2 (two) times daily.   ipratropium-albuterol (DUONEB) 0.5-2.5 (3) MG/3ML SOLN Take 3 mLs by nebulization every 4 (four) hours as needed.   Multiple Vitamin (MULTIVITAMIN) capsule Take 1 capsule by mouth daily.    SYMBICORT 160-4.5 MCG/ACT inhaler Inhale 2 puffs into the lungs 2 (two) times daily. (Patient taking differently: Inhale 2 puffs into the lungs as needed.)     Allergies:   Codeine, Amoxicillin, Amoxicillin-pot clavulanate, Bee venom, Elemental sulfur, Latex, Metoprolol, Milk-related compounds, Shellfish allergy, Sulfa antibiotics, and Sulfamethoxazole   Social History   Socioeconomic History   Marital status: Single    Spouse name: Not on file   Number of children: 0   Years of education: 16   Highest education level: Not on file  Occupational History   Occupation: Retired Chartered loss adjuster  Tobacco Use   Smoking status: Never   Smokeless tobacco: Never  Vaping Use    Vaping Use: Never used  Substance and Sexual Activity   Alcohol use: No    Alcohol/week: 0.0 standard drinks of alcohol   Drug use: No   Sexual activity: Not Currently  Other Topics Concern   Not on file  Social History Narrative   Right handed   Caffeine use: once per week or less, drinks mostly water   Single woman, who lives alone. 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   Social Determinants of Health   Financial Resource Strain: Not on file  Food Insecurity: Not on file  Transportation Needs: Not on file  Physical Activity: Not on file  Stress: Not on file  Social Connections: Not on file  Family History: The patient's family history includes Anemia in her mother; Asthma in her mother; Atrial fibrillation in her brother; Cancer in her maternal grandfather and paternal grandfather; Diabetes in her maternal grandmother, paternal grandfather, and paternal grandmother; Heart attack in her paternal grandfather; Hypertension in her brother, father, mother, sister, and another family member; Stroke in her brother, father, and maternal grandmother. There is no history of Colon cancer, Esophageal cancer, Inflammatory bowel disease, Liver disease, Pancreatic cancer, or Rectal cancer.  ROS:   Please see the history of present illness.    + lightheadedness + palpitations All other systems reviewed and are negative.  Labs/Other Studies Reviewed:    The following studies were reviewed today:  Cardiac Monitor 07/22/20 Patient had a min HR of 46 bpm, max HR of 115 bpm, and avg HR of 67 bpm. Predominant underlying rhythm was Sinus Rhythm. Isolated SVEs were occasional (3.2%, 9516), SVE Couplets were rare (<1.0%, 8), and no SVE Triplets were present. Isolated VEs were  rare (<1.0%), and no VE  Couplets or VE Triplets were present.    Summary: Sinus rhythm with occasional premature atrial contractions. Rare premature ventricular contractions.   Recent Labs: 01/20/2022: ALT 21; BUN 8; Creatinine, Ser 0.74; Hemoglobin 13.0; Platelets 404; Potassium 3.5; Sodium 138  Recent Lipid Panel    Component Value Date/Time   CHOL 207 (H) 05/21/2021 0934   TRIG 45 05/21/2021 0934   HDL 73 05/21/2021 0934   CHOLHDL 2.8 05/21/2021 0934   LDLCALC 126 (H) 05/21/2021 0934     Risk Assessment/Calculations:           Physical Exam:    VS:  BP 106/70   Pulse 72   Ht 5' 1.5" (1.562 m)   Wt 150 lb 3.2 oz (68.1 kg)   LMP 11/16/2009   SpO2 99%   BMI 27.92 kg/m     Wt Readings from Last 3 Encounters:  07/27/22 150 lb 3.2 oz (68.1 kg)  07/14/22 148 lb 6.4 oz (67.3 kg)  03/23/22 138 lb (62.6 kg)     GEN:  Well nourished, well developed in no acute distress HEENT: Normal NECK: No JVD; No carotid bruits CARDIAC: RRR, no murmurs, rubs, gallops RESPIRATORY:  Clear to auscultation without rales, wheezing or rhonchi  ABDOMEN: Soft, non-tender, non-distended MUSCULOSKELETAL:  No edema; No deformity. 2+ pedal pulses, equal bilaterally SKIN: Warm and dry NEUROLOGIC:  Alert and oriented x 3 PSYCHIATRIC:  Normal affect   EKG:  EKG is not ordered today.    Diagnoses:    1. Palpitations   2. Primary hypertension   3. Coronary vasospasm   4. Lightheadedness   5. Essential hypertension    Assessment and Plan:     Palpitations: Feels heart rate speed up and "thumping" in her chest that occurs randomly. Feels that symptoms may be more pronounced in the evenings. Has also been experiencing lightheadedness and feels more fatigued in the evenings. Symptoms suspicious for a fib. Normal TSH 07/08/22. Discussion about management of anxiety. She is currently changing anti-anxiety medications with the guidance of neurology. We will place 14 day Zio monitor for evaluation of arrhythmia.     Lightheadedness: Symptom of lightheadedness for approximately 1 month. Feels that this occurs more often in the evening. She has monitored home BP with no evidence of extreme highs or lows. Pulse has been stable as well. Has been seen by ENT and neurology and has head MRI pending. Symptoms not consistent with vertigo. Placing 14 day Zio patch monitor  as noted above. Continue follow-up with neurology.   Chest pain: Chest discomfort described as palpitations and a feeling of "weakness" in her chest. Normal coronary arteries on cath 2012 and 2014. History of suspected coronary vasospasm. Symptoms sound more consistent with palpitations. Will consider further ischemia evaluation if monitor is unrevealing.   Hypertension: BP is well controlled, soft at times.  Home BP readings range from SBP 108-147, DBP 65-83. Pulse 58-74. I think she overall has good control. Will consider medication changes in light of monitor results as they are available.      Disposition: 6 weeks with me  Medication Adjustments/Labs and Tests Ordered: Current medicines are reviewed at length with the patient today.  Concerns regarding medicines are outlined above.  Orders Placed This Encounter  Procedures   LONG TERM MONITOR (3-14 DAYS)   No orders of the defined types were placed in this encounter.   Patient Instructions  Medication Instructions:   Your physician recommends that you continue on your current medications as directed. Please refer to the Current Medication list given to you today.   *If you need a refill on your cardiac medications before your next appointment, please call your pharmacy*   Lab Work:  None ordered.  If you have labs (blood work) drawn today and your tests are completely normal, you will receive your results only by: MyChart Message (if you have MyChart) OR A paper copy in the mail If you have any lab test that is abnormal or we need to change your treatment, we will call you to  review the results.   Testing/Procedures:  Christena Deem- Long Term Monitor Instructions  Your physician has requested you wear a ZIO patch monitor for 14 days.  This is a single patch monitor. [employer] supplies one patch monitor per enrollment. Additional stickers are not available. Please do not apply patch if you will be having a Nuclear Stress Test,  Echocardiogram, Cardiac CT, MRI, or Chest Xray during the period you would be wearing the  monitor. The patch cannot be worn during these tests. You cannot remove and re-apply the  ZIO XT patch monitor.  Your ZIO patch monitor will be mailed 3 day USPS to your address on file. It may take 3-5 days  to receive your monitor after you have been enrolled.  Once you have received your monitor, please review the enclosed instructions. Your monitor  has already been registered assigning a specific monitor serial # to you.  Billing and Patient Assistance Program Information  We have supplied [employer] with any of your insurance information on file for billing purposes. [employer] offers a sliding scale Patient Assistance Program for patients that do not have  insurance, or whose insurance does not completely cover the cost of the ZIO monitor.  You must apply for the Patient Assistance Program to qualify for this discounted rate.  To apply, please call [employer] at [phone], select option 4, select option 2, ask to apply for  Patient Assistance Program. Meredeth Ide will ask your household income, and how many people  are in your household. They will quote your out-of-pocket cost based on that information.  [employer] will also be able to set up a 8-month, interest-free payment plan if needed.  Applying the monitor   Shave hair from upper left chest.  Hold abrader disc by orange tab. Rub abrader in 40 strokes over the upper left chest as  indicated in your monitor instructions.  Clean area with 4 enclosed alcohol pads. Let dry.  Apply patch as indicated in  monitor instructions. Patch will be placed under collarbone on left  side of chest with arrow pointing upward.  Rub patch adhesive wings for 2 minutes. Remove white label marked "1". Remove the white  label marked "2". Rub patch adhesive wings for 2 additional minutes.  While looking in a mirror, press and release button in center of patch. A small green light will  flash 3-4 times. This will be your only indicator that the monitor has been turned on.  Do not shower for the first 24 hours. You may shower after the first 24 hours.  Press the button if you feel a symptom. You will hear a small click. Record Date, Time and  Symptom in the Patient Logbook.  When you are ready to remove the patch, follow instructions on the last 2 pages of Patient  Logbook. Stick patch monitor onto the last page of Patient Logbook.  Place Patient Logbook in the blue and white box. Use locking tab on box and tape box closed  securely. The blue and white box has prepaid postage on it. Please place it in the mailbox as  soon as possible. Your physician should have your test results approximately 7 days after the  monitor has been mailed back to [facility].  Call [facility] Customer Care at [phone] if you have questions regarding  your ZIO XT patch monitor. Call them immediately if you see an orange light blinking on your  monitor.  If your monitor falls off in less than 4 days, contact our Monitor department at [phone].  If your monitor becomes loose or falls off after 4 days call [employer] at [phone] for  suggestions on securing your monitor    Follow-Up: At [facility], you and your health needs are our priority.  As part of our continuing mission to provide you with exceptional heart care, we have created designated Provider Care Teams.  These Care Teams include your primary Cardiologist (physician) and Advanced Practice Providers (APPs -  Physician Assistants and Nurse  Practitioners) who all work together to provide you with the care you need, when you need it.  We recommend signing up for the patient portal called "MyChart".  Sign up information is provided on this After Visit Summary.  MyChart is used to connect with patients for Virtual Visits (Telemedicine).  Patients are able to view lab/test results, encounter notes, upcoming appointments, etc.  Non-urgent messages can be sent to your provider as well.   To learn more about what you can do with MyChart, go to [email or web address].    Your next appointment:   6 week(s)  Provider:   Eligha Bridegroom, NP           Signed, Levi Aland, NP  07/28/2022 9:33 AM    [facility]

## 2022-07-27 NOTE — Progress Notes (Unsigned)
ZIO XT serial # [id number] from office inventory applied to patient.  Dr. Clifton James to read.

## 2022-07-28 ENCOUNTER — Encounter: Payer: Self-pay | Admitting: Nurse Practitioner

## 2022-07-28 DIAGNOSIS — F4322 Adjustment disorder with anxiety: Secondary | ICD-10-CM | POA: Diagnosis not present

## 2022-08-05 DIAGNOSIS — R002 Palpitations: Secondary | ICD-10-CM | POA: Diagnosis not present

## 2022-08-05 DIAGNOSIS — I10 Essential (primary) hypertension: Secondary | ICD-10-CM | POA: Diagnosis not present

## 2022-08-08 ENCOUNTER — Telehealth: Payer: Self-pay | Admitting: Cardiovascular Disease

## 2022-08-08 ENCOUNTER — Ambulatory Visit
Admission: RE | Admit: 2022-08-08 | Discharge: 2022-08-08 | Disposition: A | Discharge status: Home / Self Care | Payer: Medicare PPO | Source: Ambulatory Visit | Attending: Psychiatry | Admitting: Psychiatry

## 2022-08-08 DIAGNOSIS — H9191 Unspecified hearing loss, right ear: Secondary | ICD-10-CM

## 2022-08-08 MED ORDER — GADOPICLENOL 0.5 MMOL/ML IV SOLN
7.5000 mL | Freq: Once | INTRAVENOUS | Status: AC | PRN
  Administered 2022-08-08: 7.5 mL via INTRAVENOUS

## 2022-08-08 NOTE — Telephone Encounter (Signed)
Called and spoke directly with patient about heart monitor results. No further questions.

## 2022-08-08 NOTE — Telephone Encounter (Signed)
-----   Message from Kathleene Hazel, MD sent at 08/08/2022 12:46 PM EDT ----- Bianca [name], She has sinus with short runs of SVT. I doubt this is contributory to her symptoms overall. I would not change anything. Thayer Ohm

## 2022-08-11 DIAGNOSIS — H1131 Conjunctival hemorrhage, right eye: Secondary | ICD-10-CM | POA: Diagnosis not present

## 2022-08-11 DIAGNOSIS — H02881 Meibomian gland dysfunction right upper eyelid: Secondary | ICD-10-CM | POA: Diagnosis not present

## 2022-08-11 DIAGNOSIS — H02884 Meibomian gland dysfunction left upper eyelid: Secondary | ICD-10-CM | POA: Diagnosis not present

## 2022-08-12 ENCOUNTER — Other Ambulatory Visit: Payer: Self-pay | Admitting: Diagnostic Neuroimaging

## 2022-08-12 DIAGNOSIS — R2689 Other abnormalities of gait and mobility: Secondary | ICD-10-CM

## 2022-08-12 DIAGNOSIS — R42 Dizziness and giddiness: Secondary | ICD-10-CM

## 2022-08-15 ENCOUNTER — Telehealth: Payer: Self-pay | Admitting: Diagnostic Neuroimaging

## 2022-08-15 ENCOUNTER — Telehealth: Payer: Self-pay

## 2022-08-15 NOTE — Telephone Encounter (Signed)
Called pt and told her the results of her MRI and explained why she has a CTA Ordered. Her results were "Brain imaging unremarkable. Hypoplastic posterior circulation may be normal variant. Will check CTA to confirm. Read by Dr. Marjory Lies. Pt verbalized understanding.

## 2022-08-15 NOTE — Telephone Encounter (Signed)
Francine Graven Berkley Harvey: [id number] exp. 08/15/22-09/14/22 sent to GI [phone]

## 2022-08-18 DIAGNOSIS — F4322 Adjustment disorder with anxiety: Secondary | ICD-10-CM | POA: Diagnosis not present

## 2022-08-22 DIAGNOSIS — K642 Third degree hemorrhoids: Secondary | ICD-10-CM | POA: Diagnosis not present

## 2022-08-26 DIAGNOSIS — H1045 Other chronic allergic conjunctivitis: Secondary | ICD-10-CM | POA: Diagnosis not present

## 2022-08-29 DIAGNOSIS — L308 Other specified dermatitis: Secondary | ICD-10-CM | POA: Diagnosis not present

## 2022-08-29 DIAGNOSIS — L668 Other cicatricial alopecia: Secondary | ICD-10-CM | POA: Diagnosis not present

## 2022-08-29 DIAGNOSIS — H1045 Other chronic allergic conjunctivitis: Secondary | ICD-10-CM | POA: Diagnosis not present

## 2022-08-29 DIAGNOSIS — L821 Other seborrheic keratosis: Secondary | ICD-10-CM | POA: Diagnosis not present

## 2022-08-30 ENCOUNTER — Ambulatory Visit: Payer: Medicare PPO | Admitting: Neurology

## 2022-09-05 ENCOUNTER — Ambulatory Visit: Payer: Medicare PPO | Admitting: Nurse Practitioner

## 2022-09-08 DIAGNOSIS — F4322 Adjustment disorder with anxiety: Secondary | ICD-10-CM | POA: Diagnosis not present

## 2022-09-16 ENCOUNTER — Other Ambulatory Visit: Payer: Medicare PPO

## 2022-09-16 DIAGNOSIS — H1045 Other chronic allergic conjunctivitis: Secondary | ICD-10-CM | POA: Diagnosis not present

## 2022-09-16 DIAGNOSIS — D487 Neoplasm of uncertain behavior of other specified sites: Secondary | ICD-10-CM | POA: Diagnosis not present

## 2022-09-19 ENCOUNTER — Ambulatory Visit
Admission: RE | Admit: 2022-09-19 | Discharge: 2022-09-19 | Disposition: A | Discharge status: Home / Self Care | Payer: Medicare PPO | Source: Ambulatory Visit | Attending: Diagnostic Neuroimaging | Admitting: Diagnostic Neuroimaging

## 2022-09-19 DIAGNOSIS — R42 Dizziness and giddiness: Secondary | ICD-10-CM

## 2022-09-19 DIAGNOSIS — R2689 Other abnormalities of gait and mobility: Secondary | ICD-10-CM | POA: Diagnosis not present

## 2022-09-19 MED ORDER — IOPAMIDOL (ISOVUE-370) INJECTION 76%
75.0000 mL | Freq: Once | INTRAVENOUS | Status: AC | PRN
  Administered 2022-09-19: 75 mL via INTRAVENOUS

## 2022-09-20 DIAGNOSIS — F4322 Adjustment disorder with anxiety: Secondary | ICD-10-CM | POA: Diagnosis not present

## 2022-09-21 DIAGNOSIS — D22112 Melanocytic nevi of right lower eyelid, including canthus: Secondary | ICD-10-CM | POA: Diagnosis not present

## 2022-09-30 DIAGNOSIS — R634 Abnormal weight loss: Secondary | ICD-10-CM | POA: Diagnosis not present

## 2022-09-30 DIAGNOSIS — K269 Duodenal ulcer, unspecified as acute or chronic, without hemorrhage or perforation: Secondary | ICD-10-CM | POA: Diagnosis not present

## 2022-09-30 DIAGNOSIS — R109 Unspecified abdominal pain: Secondary | ICD-10-CM | POA: Diagnosis not present

## 2022-09-30 DIAGNOSIS — K2950 Unspecified chronic gastritis without bleeding: Secondary | ICD-10-CM | POA: Diagnosis not present

## 2022-09-30 DIAGNOSIS — J45909 Unspecified asthma, uncomplicated: Secondary | ICD-10-CM | POA: Diagnosis not present

## 2022-09-30 DIAGNOSIS — K3189 Other diseases of stomach and duodenum: Secondary | ICD-10-CM | POA: Diagnosis not present

## 2022-09-30 DIAGNOSIS — K219 Gastro-esophageal reflux disease without esophagitis: Secondary | ICD-10-CM | POA: Diagnosis not present

## 2022-09-30 DIAGNOSIS — R1013 Epigastric pain: Secondary | ICD-10-CM | POA: Diagnosis not present

## 2022-09-30 DIAGNOSIS — K2281 Esophageal polyp: Secondary | ICD-10-CM | POA: Diagnosis not present

## 2022-09-30 DIAGNOSIS — K259 Gastric ulcer, unspecified as acute or chronic, without hemorrhage or perforation: Secondary | ICD-10-CM | POA: Diagnosis not present

## 2022-09-30 DIAGNOSIS — D130 Benign neoplasm of esophagus: Secondary | ICD-10-CM | POA: Diagnosis not present

## 2022-09-30 DIAGNOSIS — I10 Essential (primary) hypertension: Secondary | ICD-10-CM | POA: Diagnosis not present

## 2022-09-30 DIAGNOSIS — R1012 Left upper quadrant pain: Secondary | ICD-10-CM | POA: Diagnosis not present

## 2022-10-04 DIAGNOSIS — F4322 Adjustment disorder with anxiety: Secondary | ICD-10-CM | POA: Diagnosis not present

## 2022-10-05 ENCOUNTER — Telehealth: Payer: Self-pay | Admitting: Psychiatry

## 2022-10-05 NOTE — Telephone Encounter (Signed)
Pt having worsening symptoms" ligtheadedness, numbness, pain left arm for a couple weeks. Would like a call back.

## 2022-10-05 NOTE — Telephone Encounter (Signed)
Called pt and she stated that the numbness in her foot isn't a new symptom. Pt states that her lightheadedness(Which only happens when she has her head down for a couple of min), pain in her left arm, and neck are new symptoms that she has been having recently and the pain comes and goes. Pt wanted an explanation on her CT scan she had done. Told pt per Dr. Epimenio Foot that her blood vessels in the back of her brain were smaller that usual,but that was normal. Pt is asking to be seen soon by MD due to new symptoms. Told pt that if symptoms get worse to please go to the ER, pt then states that she doesn't want to wait 7-9 hours to be seen and that she needs to be seen by neurology. Told pt that the ON CALL Doctor will be notified and we will let her know what they decide.  Pt verbalized understanding.

## 2022-10-05 NOTE — Telephone Encounter (Signed)
Called pt to get more info. Confirmed sx started a couple weeks back. Denies any injuries prior to sx starting. She is also having pain on both sides of her neck. Last seen 07/14/22 and next f/u 03/20/23. She asked that we give her a call back as she was soaking in the tub. She preferred we call her instead of her calling office.

## 2022-10-06 ENCOUNTER — Emergency Department (HOSPITAL_COMMUNITY)
Admission: EM | Admit: 2022-10-06 | Discharge: 2022-10-06 | Disposition: A | Discharge status: Home / Self Care | Payer: Medicare PPO | Attending: Emergency Medicine | Admitting: Emergency Medicine

## 2022-10-06 DIAGNOSIS — R42 Dizziness and giddiness: Secondary | ICD-10-CM | POA: Diagnosis not present

## 2022-10-06 DIAGNOSIS — I10 Essential (primary) hypertension: Secondary | ICD-10-CM | POA: Insufficient documentation

## 2022-10-06 DIAGNOSIS — Z79899 Other long term (current) drug therapy: Secondary | ICD-10-CM | POA: Insufficient documentation

## 2022-10-06 DIAGNOSIS — Z91040 Latex allergy status: Secondary | ICD-10-CM | POA: Insufficient documentation

## 2022-10-06 LAB — CBC
HCT: 37.6 % (ref 36.0–46.0)
Hemoglobin: 13.4 g/dL (ref 12.0–15.0)
MCH: 29.3 pg (ref 26.0–34.0)
MCHC: 35.6 g/dL (ref 30.0–36.0)
MCV: 82.1 fL (ref 80.0–100.0)
Platelets: 312 10*3/uL (ref 150–400)
RBC: 4.58 MIL/uL (ref 3.87–5.11)
RDW: 13.7 % (ref 11.5–15.5)
WBC: 6.3 10*3/uL (ref 4.0–10.5)
nRBC: 0 % (ref 0.0–0.2)

## 2022-10-06 LAB — BASIC METABOLIC PANEL
Anion gap: 6 (ref 5–15)
BUN: 22 mg/dL (ref 8–23)
CO2: 26 mmol/L (ref 22–32)
Calcium: 10.1 mg/dL (ref 8.9–10.3)
Chloride: 107 mmol/L (ref 98–111)
Creatinine, Ser: 0.73 mg/dL (ref 0.44–1.00)
GFR, Estimated: >60 mL/min (ref >60)
Glucose, Bld: 122 mg/dL — ABNORMAL HIGH (ref 70–99)
Potassium: 4 mmol/L (ref 3.5–5.1)
Sodium: 139 mmol/L (ref 135–145)

## 2022-10-06 LAB — URINALYSIS, ROUTINE W REFLEX MICROSCOPIC
Bacteria, UA: NONE SEEN
Bilirubin Urine: NEGATIVE
Glucose, UA: NEGATIVE mg/dL
Ketones, ur: NEGATIVE mg/dL
Leukocytes,Ua: NEGATIVE
Nitrite: NEGATIVE
Protein, ur: NEGATIVE mg/dL
Specific Gravity, Urine: 1.009 (ref 1.005–1.030)
pH: 5 (ref 5.0–8.0)

## 2022-10-06 LAB — TROPONIN I (HIGH SENSITIVITY): Troponin I (High Sensitivity): 4 ng/L (ref <18)

## 2022-10-06 MED ORDER — SODIUM CHLORIDE 0.9 % IV BOLUS
500.0000 mL | Freq: Once | INTRAVENOUS | Status: AC
  Administered 2022-10-06: 500 mL via INTRAVENOUS

## 2022-10-06 NOTE — ED Provider Notes (Incomplete)
[place] EMERGENCY DEPARTMENT AT [facility] Provider Note   CSN: [id number] Arrival date & time: 10/06/22  1143     History {Add pertinent medical, surgical, social history, OB history to HPI:1} Chief Complaint  Patient presents with  . Dizziness    Bianca [name] is a 67 y.o. female.   Dizziness   67 year old female presents emergency department with complaints of lightheadedness.  Patient states the lightheadedness has been present for the past 6 months intermittently.  States that she has had similar episodes prior to beginning start medications as well as trying certain foods.  States that symptoms began around 6 AM this morning after taking Protonix around 530 this morning.  States that she just began taking Protonix yesterday with first dose at that time.  States she had a similar reaction to Prilosec but states it is slightly more noticeable at this time.  Describes lightheadedness as feelings of faintness without room spinning/spinning type sensation.  Describes that symptoms are worsened with certain positional changes as well as "sometimes with walking".  Denies any current visual disturbance, gait abnormality, slurred speech, facial droop, weakness or sensory deficits in upper or lower extremities, dysphagia, dysarthria.  Denies any chest pain, shortness of breath, fever, chills, nausea, vomiting, urinary symptoms.  She does states that she has had 5-6 bowel movements today which is abnormal for her.  Past medical history significant for coronary vasospasm, lower extremity edema, hypertension  Home Medications Prior to Admission medications   Medication Sig Start Date End Date Taking? Authorizing Provider  acetaminophen (TYLENOL) 325 MG tablet Take 500 mg by mouth every 6 (six) hours as needed for mild pain.     [provider]  albuterol (VENTOLIN HFA) 108 (90 Base) MCG/ACT inhaler Inhale 1-2 puffs into the lungs every 6 (six) hours as needed for  wheezing or shortness of breath.    [provider]  ALPRAZolam Prudy Feeler) 0.5 MG tablet Take 0.5 mg by mouth as needed. 03/26/19   [provider]  amLODipine (NORVASC) 2.5 MG tablet Take 1 tablet (2.5 mg total) by mouth 2 (two) times daily. 03/08/22   Kathleene Hazel, MD  ascorbic acid (VITAMIN C) 500 MG tablet Take 500 mg by mouth daily.    [provider]  AUVI-Q 0.3 MG/0.3ML SOAJ injection Inject 0.3 mLs (0.3 mg total) into the muscle as needed for anaphylaxis. 07/03/18   Kozlow, Alvira Philips, MD  cholecalciferol (VITAMIN D3) 25 MCG (1000 UNIT) tablet Take 1,000 Units by mouth daily.    [provider]  DULoxetine (CYMBALTA) 20 MG capsule Take 1 capsule (20 mg total) by mouth daily. 07/14/22   Ocie Doyne, MD  fluticasone (FLONASE) 50 MCG/ACT nasal spray Place 1 spray into both nostrils as needed for allergies.    [provider]  guaiFENesin (MUCINEX) 600 MG 12 hr tablet Take by mouth as needed.     [provider]  hydrocortisone-pramoxine [facility]) rectal foam Place 1 applicator rectally 2 (two) times daily. 03/23/21   Mansouraty, Netty Starring., MD  ipratropium-albuterol (DUONEB) 0.5-2.5 (3) MG/3ML SOLN Take 3 mLs by nebulization every 4 (four) hours as needed. 12/27/18   Kozlow, Alvira Philips, MD  Multiple Vitamin (MULTIVITAMIN) capsule Take 1 capsule by mouth daily.     [provider]  SYMBICORT 160-4.5 MCG/ACT inhaler Inhale 2 puffs into the lungs 2 (two) times daily. Patient taking differently: Inhale 2 puffs into the lungs as needed. 01/14/19   Alfonse Spruce, MD  Allergies    Codeine, Amoxicillin, Amoxicillin-pot clavulanate, Bee venom, Elemental sulfur, Latex, Metoprolol, Milk-related compounds, Shellfish allergy, Sulfa antibiotics, and Sulfamethoxazole    Review of Systems   Review of Systems  Neurological:  Positive for dizziness.  All other systems reviewed and are negative.   Physical Exam Updated Vital  Signs BP (!) 142/89   Pulse 63   Temp 98 F (36.7 C) (Oral)   Resp 18   Ht 5\' 1"  (1.549 m)   Wt 69.4 kg   LMP 11/16/2009   SpO2 100%   BMI 28.91 kg/m  Physical Exam Vitals and nursing note reviewed.  Constitutional:      General: She is not in acute distress.    Appearance: She is well-developed.  HENT:     Head: Normocephalic and atraumatic.  Eyes:     Conjunctiva/sclera: Conjunctivae normal.  Cardiovascular:     Rate and Rhythm: Normal rate and regular rhythm.     Heart sounds: No murmur heard. Pulmonary:     Effort: Pulmonary effort is normal. No respiratory distress.     Breath sounds: Normal breath sounds.  Abdominal:     Palpations: Abdomen is soft.     Tenderness: There is no abdominal tenderness.  Musculoskeletal:        General: No swelling.     Cervical back: Neck supple.  Skin:    General: Skin is warm and dry.     Capillary Refill: Capillary refill takes less than 2 seconds.  Neurological:     Mental Status: She is alert.     Comments: Alert and oriented to self, place, time and event.   Speech is fluent, clear without dysarthria or dysphasia.   Strength symmetric in upper/lower extremities   Sensation intact in upper/lower extremities   Normal gait.  No pronator drift.  Normal finger-to-nose and feet tapping.  CN I not tested  CN II not tested CN III, IV, VI PERRLA and EOMs intact bilaterally without obvious nystagmus CN V Intact sensation to sharp and light touch to the face  CN VII facial movements symmetric  CN VIII not tested  CN IX, X no uvula deviation, symmetric rise of soft palate  CN XI symmetric SCM and trapezius strength bilaterally  CN XII Midline tongue protrusion, symmetric L/R movements   Psychiatric:        Mood and Affect: Mood normal.     ED Results / Procedures / Treatments   Labs (all labs ordered are listed, but only abnormal results are displayed) Labs Reviewed  BASIC METABOLIC PANEL  CBC  URINALYSIS, ROUTINE W  REFLEX MICROSCOPIC    EKG None  Radiology No results found.  Procedures Procedures  {Document cardiac monitor, telemetry assessment procedure when appropriate:1}  Medications Ordered in ED Medications - No data to display  ED Course/ Medical Decision Making/ A&P Clinical Course as of 10/06/22 1653  Thu Oct 06, 2022  1518 This is a 67 year old female presented to ED with episodic lightheadedness, including this morning.  Symptoms have resolved now since coming into the ED.  The patient has had a constellation of symptoms in the past several months and has had extensive outpatient workups for these, including stomach upset, with endoscopy and colonoscopy workup performed at [facility], as well as CTA and MRI imaging of the brain performed in the past month by her neurologist for these lightheaded episodes.  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  Patient has also had cardiac telemetry twice in the past 2 years, including in April, with a Zio patch at home, with only occasional PACs and short SVT noted, no A-fib or other dangerous arrhythmia per my review of the records.  Here in the ED she is asymptomatic, with normal vital signs, unremarkable UA and labs, no acute anemia, normal troponin, normal electrolytes, and EKG does not show acute ischemic findings.  Is unclear was causing etiology of her lightheaded spells.  This may be vasovagal in nature?  I do think is reasonable for her to have an outpatient echocardiogram, if her cardiologist were to agree, and I will send a message to the cardiologist.  We also discussed the option of observation stay in the hospital to complete this but she prefers to do this as an outpatient, citing financial concerns.  I think this is reasonable and she is stable for this plan [MT]    Clinical Course User Index [MT] Trifan, Kermit Balo, MD   {   Click here for ABCD2, HEART and other calculatorsREFRESH Note before signing :1}                          Medical Decision Making Amount and/or Complexity of Data Reviewed Labs: ordered.   This patient presents to the ED for concern of lightheadedness, this involves an extensive number of treatment options, and is a complaint that carries with it a high risk of complications and morbidity.  The differential diagnosis includes CVA, arrhythmia, dehydration, anemia, carotid artery/vertebral artery dissection/occlusion, valvular disorder, BPPV, Mnire's disease, labyrinthitis, malignancy   Co morbidities that complicate the patient evaluation  See HPI   Additional history obtained:  Additional history obtained from EMR External records from outside source obtained and reviewed including hospital records.  Patient had CT angio head and neck performed on 6/3 and MR brain on 4/22.  Monitoring from cardiology showing runs of SVT on 4/10   Lab Tests:  I Ordered, and personally interpreted labs.  The pertinent results include: No leukocytosis noted.  No evidence of anemia.  Persistent symptoms.  No Electra abnormalities.  No renal dysfunction.  UA significant for small hemoglobin otherwise unremarkable.  Troponin of 4.   Imaging Studies ordered:  N/a   Cardiac Monitoring: / EKG:  The patient was maintained on a cardiac monitor.  I personally viewed and interpreted the cardiac monitored which showed an underlying rhythm of: Sinus rhythm   Consultations Obtained:  I requested consultation with attending physician Dr. Renaye Rakers who is in agreement with treatment plan After independent assessment.   Problem List / ED Course / Critical interventions / Medication management  Lightheadedness I ordered medication including 500 cc normal saline   Reevaluation of the patient after these medicines showed that the patient improved I have reviewed the patients home medicines and  have made adjustments as needed   Social Determinants of Health:  Denies tobacco or illicit drug use.   Test / Admission - Considered:  Lightheadedness Vitals signs significant for mild hypertension with blood pressure 142/89. Otherwise within normal range and stable throughout visit. Laboratory/imaging studies significant for: See above 67 year old female presents emergency department with complaints of lightheadedness.  Lightheadedness sometimes exacerbated by physical exertion, sometimes positional, and reported to be related to medication/food intake at other times .  Patient without any current acute neurologic deficit on exam without room spinning  type sensation; patient also recently with MRI brain as well as CT angio head and neck in the past 1 to 2 months was low suspicion for intracranial process.  *** Worrisome signs and symptoms were discussed with the patient, and the patient acknowledged understanding to return to the ED if noticed. Patient was stable upon discharge.    {Document critical care time when appropriate:1} {Document review of labs and clinical decision tools ie heart score, Chads2Vasc2 etc:1}  {Document your independent review of radiology images, and any outside records:1} {Document your discussion with family members, caretakers, and with consultants:1} {Document social determinants of health affecting pt's care:1} {Document your decision making why or why not admission, treatments were needed:1} Final Clinical Impression(s) / ED Diagnoses Final diagnoses:  None    Rx / DC Orders ED Discharge Orders     None

## 2022-10-06 NOTE — ED Notes (Signed)
Patient ambulated up and down the hallway without any difficulty. Reports her light headedness is much better than before. Informed Cooper, PA about the results.

## 2022-10-06 NOTE — ED Notes (Signed)
Pt requests to see dr today vs PA

## 2022-10-06 NOTE — ED Notes (Signed)
Pt ambulatory to bathroom for urine sample. Pt ambulates appropriately without assistance

## 2022-10-06 NOTE — ED Triage Notes (Signed)
Pt reports lightheadedness since 6am and some dizziness. Started taking protonix yesterday for ulcers. Reports normal appetite and intake. Has had recent reactions to abx and prilosec.

## 2022-10-06 NOTE — Discharge Instructions (Addendum)
As discussed, recommend follow-up with your cardiologist in the outpatient setting..  As discussed, recommend nutritional supplementation in the form of Ensure, Kachava, or other nutritional supplementation.  Please do not hesitate to return to emergency department if the worrisome signs or symptoms we discussed become apparent.

## 2022-10-07 ENCOUNTER — Telehealth: Payer: Self-pay | Admitting: Cardiovascular Disease

## 2022-10-07 DIAGNOSIS — R42 Dizziness and giddiness: Secondary | ICD-10-CM

## 2022-10-07 NOTE — Telephone Encounter (Signed)
Patient stated she had an ED visit and they recommended she get an Echocardiogram.  Patient called to get orders for the test.

## 2022-10-07 NOTE — Telephone Encounter (Signed)
Okay to place order for echocardiogram per Dr. Clifton James.

## 2022-10-11 NOTE — Telephone Encounter (Signed)
Pt called demanding that she be seen immediately because of the pain in her left arm as well as the lightheadedness when she has her head down. Told pt that Dr. Epimenio Foot said "Please let her know:  1.  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  When this happens, the blood vessels in the front of the brain develop to take over some of the blood supply that normally would come from the vertebral and basilar arteries in the back of the brain.    It does not look like she has blockage due to atherosclerosis.  2.  When the neck is bent, there can be a little less blood flow in the back of the brain.  It is possible that the dizziness is related to the arteries being smaller.  3.   The arteries being small does not explain neck or arm pain  4.   Since she is having new symptoms we can see if she can be worked one of the provider's  schedule sometime in the next week or 2.  I do not know if anybody has openings.  5.   Would recommend that she try to avoid prolonged flexion or extension of her neck"  Pt stated that she still don't understand what the doctor is saying, explained in greater details for her and told pt that we will send her symptoms to the work in doctor for their advise. Pt then states that "it is not her fault that Dr. Delena Bali went out knowing that she has new patients." Told pt that Dr. Delena Bali had a baby and that she will be back next month. Even with telling the pt that she is still demanding to be seen. Sending to the work-in doctor.

## 2022-10-12 DIAGNOSIS — Z713 Dietary counseling and surveillance: Secondary | ICD-10-CM | POA: Diagnosis not present

## 2022-10-13 ENCOUNTER — Other Ambulatory Visit: Payer: Self-pay

## 2022-10-13 ENCOUNTER — Encounter: Payer: Self-pay | Admitting: Allergy & Immunology

## 2022-10-13 ENCOUNTER — Ambulatory Visit: Payer: Medicare PPO | Admitting: Allergy & Immunology

## 2022-10-13 VITALS — BP 114/72 | HR 70 | Temp 98.7°F | Resp 16 | Ht 61.0 in | Wt 150.5 lb

## 2022-10-13 DIAGNOSIS — I10 Essential (primary) hypertension: Secondary | ICD-10-CM

## 2022-10-13 DIAGNOSIS — I201 Angina pectoris with documented spasm: Secondary | ICD-10-CM

## 2022-10-13 DIAGNOSIS — R1031 Right lower quadrant pain: Secondary | ICD-10-CM | POA: Diagnosis not present

## 2022-10-13 DIAGNOSIS — K259 Gastric ulcer, unspecified as acute or chronic, without hemorrhage or perforation: Secondary | ICD-10-CM | POA: Diagnosis not present

## 2022-10-13 DIAGNOSIS — T7840XD Allergy, unspecified, subsequent encounter: Secondary | ICD-10-CM

## 2022-10-13 DIAGNOSIS — T7840XA Allergy, unspecified, initial encounter: Secondary | ICD-10-CM

## 2022-10-13 DIAGNOSIS — K5900 Constipation, unspecified: Secondary | ICD-10-CM | POA: Diagnosis not present

## 2022-10-13 DIAGNOSIS — K30 Functional dyspepsia: Secondary | ICD-10-CM | POA: Diagnosis not present

## 2022-10-13 DIAGNOSIS — T7800XA Anaphylactic reaction due to unspecified food, initial encounter: Secondary | ICD-10-CM | POA: Diagnosis not present

## 2022-10-13 DIAGNOSIS — T7800XD Anaphylactic reaction due to unspecified food, subsequent encounter: Secondary | ICD-10-CM

## 2022-10-13 DIAGNOSIS — K638219 Small intestinal bacterial overgrowth, unspecified: Secondary | ICD-10-CM | POA: Diagnosis not present

## 2022-10-13 NOTE — Patient Instructions (Addendum)
1. Allergic reactions to multiple medications - Typically we see urticaria (hives) with MCAS or mastocytosis, but anything is possible. - We are going to send a lot of labs to rule this out, including some 24-hour urine studies. - Some of these labs take a long time to come back. - We are going to continue with the Pepcid twice daily (H2 blockers). - Start Xyzal 5 mg daily (H1 blocker)  - Start Singulair 10 mg daily (leukotriene receptor blocker). - Maybe these medications together will help with your tolerating medications and preventing these reactions in the future.   2. Return in about 4 weeks (around 11/10/2022). You can have the follow up appointment with Dr. Dellis Anes or a Nurse Practicioner (our Nurse Practitioners are excellent and always have Physician oversight!).    Please inform us of any Emergency Department visits, hospitalizations, or changes in symptoms. Call us before going to the ED for breathing or allergy symptoms since we might be able to fit you in for a sick visit. Feel free to contact us anytime with any questions, problems, or concerns.  It was a pleasure to see you again today!  Websites that have reliable patient information: 1. American Academy of Asthma, Allergy, and Immunology: www.aaaai.org 2. Food Allergy Research and Education (FARE): foodallergy.org 3. Mothers of Asthmatics: http://www.asthmacommunitynetwork.org 4. American College of Allergy, Asthma, and Immunology: www.acaai.org   COVID-19 Vaccine Information can be found at: [email or web address].[email or web address] For questions related to vaccine distribution or appointments, please email [email or web address] or call [phone].   We realize that you might be concerned about having an allergic reaction to the COVID19 vaccines. To help with that concern, WE ARE OFFERING THE COVID19 VACCINES IN OUR OFFICE! Ask the front desk for dates!     "Like" [place] on Facebook  and Instagram for our latest updates!      A healthy democracy works best when [employer] participate! Make sure you are registered to vote! If you have moved or changed any of your contact information, you will need to get this updated before voting!  In some cases, you MAY be able to register to vote online: [email or web address]

## 2022-10-13 NOTE — Progress Notes (Signed)
NEW PATIENT  Date of Service/Encounter:  10/13/22  Consult requested by: Bianca Joe, MD   Assessment:   Allergic reactions to multiple medications  Plan/Recommendations:    Patient Instructions  1. Allergic reactions to multiple medications - Typically we see urticaria (hives) with MCAS or mastocytosis, but anything is possible. - We are going to send a lot of labs to rule this out, including some 24-hour urine studies. - Some of these labs take a long time to come back. - We are going to continue with the Pepcid twice daily (H2 blockers). - Start Xyzal 5 mg daily (H1 blocker)  - Start Singulair 10 mg daily (leukotriene receptor blocker). - Maybe these medications together will help with your tolerating medications and preventing these reactions in the future.   2. Return in about 4 weeks (around 11/10/2022). You can have the follow up appointment with Dr. Dellis [name] or a Nurse Practicioner (our Nurse Practitioners are excellent and always have Physician oversight!).    Please inform us of any Emergency Department visits, hospitalizations, or changes in symptoms. Call us before going to the ED for breathing or allergy symptoms since we might be able to fit you in for a sick visit. Feel free to contact us anytime with any questions, problems, or concerns.  It was a pleasure to see you again today!  Websites that have reliable patient information: 1. American Academy of Asthma, Allergy, and Immunology: www.aaaai.org 2. Food Allergy Research and Education (FARE): foodallergy.org 3. Mothers of Asthmatics: http://www.asthmacommunitynetwork.org 4. American College of Allergy, Asthma, and Immunology: www.acaai.org   COVID-19 Vaccine Information can be found at: [email or web address] For questions related to vaccine distribution or appointments, please email [email or web address] or call [phone].   We realize that you  might be concerned about having an allergic reaction to the COVID19 vaccines. To help with that concern, WE ARE OFFERING THE COVID19 VACCINES IN OUR OFFICE! Ask the front desk for dates!     "Like" [place] on Facebook and Instagram for our latest updates!      A healthy democracy works best when [employer] participate! Make sure you are registered to vote! If you have moved or changed any of your contact information, you will need to get this updated before voting!  In some cases, you MAY be able to register to vote online: [email or web address]            {Blank single:19197::"This note in its entirety was forwarded to the Provider who requested this consultation."}  Subjective:   Bianca [name] is a 67 y.o. female presenting today for evaluation of  Chief Complaint  Patient presents with  . Establish Care    C/o having a reaction to the medication for her eyes and also medication she has received for her GI provider causes stomach issues.    Bianca [name] has a history of the following: Patient Active Problem List   Diagnosis Date Noted  . Coronary vasospasm (HCC) 05/18/2021  . Premature atrial contractions 05/18/2021  . Lower extremity edema 05/18/2021  . Hemorrhoids 05/18/2021  . Rectal pain 03/23/2021  . Change in bowel habits 03/23/2021  . RLQ abdominal pain 03/23/2021  . Right flank pain 03/23/2021  . Acute midline thoracic back pain 03/23/2021  . Chest pain 03/13/2013  . Abnormal stress ECG with treadmill 02/24/2013  . HTN (hypertension) 12/24/2011  . Heart palpitations 12/24/2011  . Awareness of heartbeats 11/30/2011    History obtained from: chart review and patient.  Bianca [name] was  referred by Bianca Joe, MD.     Bianca [name] is a 67 y.o. female presenting for an evaluation of ***.  She was last seen in September 2020.  At that time, lung testing looked great.  We recommended using Symbicort every day and continuing with  albuterol as needed.  For her rhinitis, we continue with Flonase as well as an antihistamine as needed.  Since last visit, she has started having trouble tolerating medications.   She developed some inflammation and swelling in both eyes. She had redness and irritation. She had several reactions to the medications that they gave her. One was a salve. This was around one month ago. This was [facility].   She has had some GI issues as well. She had an endoscopy on June 13th and she had a polyp removed from her esophagus. She was prescribed Prilosec and omeprazole, she had a reaction including pain on her arms and legs and numbness.  She is no longer able to tolerate beef as well as fries and oatmeal, rice, potatoes. She has stomach pain and constipation.   She thinks that the Pepcid is the thing that has worked best for her. This has helped. More than anything else, but she has been having itching and inflammation.   Her GI doctor talked about stress management. She was placed on fluoxetine. She has been taking this for around ten years. She stopped it around 2-3 years ago and restarted it in November 2023. She stopped it on May 10th of this past.  She has no hives with this, but she has with the shellfish. She has a lot of GI issues with the medications - more constipation  than anything else. She has never had throat swelling. She did have urine samples for urinalysis, otherwise no urtine studies.  [2:26 PM] Maurine Minister, [name] wheat, rice, red meats, avocado, potato     She did have something on one of her kidetns.t It was felt that she did not  need more work up for that  Bilateral adrenal glands appear normal. No hydronephrosis. Well-circumscribed 12 mm left upper pole renal lesion has a thin internal enhancing septation without suspicious mural/septal thickening or enhancing nodularity consistent with a benign Bosniak classification 2 renal cysts. No solid enhancing renal mass  She  is scheduled for an echocardiogram on July 16th.     {Blank single:19197::"Asthma/Respiratory Symptom History: ***"," "}  {Blank single:19197::"Allergic Rhinitis Symptom History: ***"," "}  {Blank single:19197::"Food Allergy Symptom History: ***"," "}  {Blank single:19197::"Skin Symptom History: ***"," "}  {Blank single:19197::"GERD Symptom History: ***"," "}  ***Otherwise, there is no history of other atopic diseases, including {Blank multiple:19196:o:"asthma","food allergies","drug allergies","environmental allergies","stinging insect allergies","eczema","urticaria","contact dermatitis"}. There is no significant infectious history. ***Vaccinations are up to date.    Past Medical History: Patient Active Problem List   Diagnosis Date Noted  . Coronary vasospasm (HCC) 05/18/2021  . Premature atrial contractions 05/18/2021  . Lower extremity edema 05/18/2021  . Hemorrhoids 05/18/2021  . Rectal pain 03/23/2021  . Change in bowel habits 03/23/2021  . RLQ abdominal pain 03/23/2021  . Right flank pain 03/23/2021  . Acute midline thoracic back pain 03/23/2021  . Chest pain 03/13/2013  . Abnormal stress ECG with treadmill 02/24/2013  . HTN (hypertension) 12/24/2011  . Heart palpitations 12/24/2011  . Awareness of heartbeats 11/30/2011    Medication List:  Allergies as of 10/13/2022       Reactions   Codeine Other (See Comments)   Other reaction(s): Other (See Comments)  Pounding in head  Pounding in head    Amoxicillin Other (See Comments)   Diarrhea and vomiting.  Has patient had a PCN reaction causing immediate rash, facial/tongue/throat swelling, SOB or lightheadedness with hypotension: No Has patient had a PCN reaction causing severe rash involving mucus membranes or skin necrosis: No Has patient had a PCN reaction that required hospitalization No Has patient had a PCN reaction occurring within the last 10 years: No If all of the above answers are "NO", then may proceed  with Cephalosporin use.   Amoxicillin-pot Clavulanate Other (See Comments)   Diarrhea and vomiting.    Bee Venom    Swelling   Elemental Sulfur    Latex Hives, Other (See Comments)   Metoprolol Other (See Comments)   Hair loss   Milk-related Compounds    Diarrhea & gas   Shellfish Allergy    HIVES, SWELLING, N/V & DIARREHA   Sulfa Antibiotics    Sulfamethoxazole Other (See Comments)        Medication List        Accurate as of October 13, 2022 11:59 PM. If you have any questions, ask your nurse or doctor.          STOP taking these medications    ALPRAZolam 0.5 MG tablet Commonly known as: Prudy Feeler Stopped by: Alfonse Spruce, MD       TAKE these medications    acetaminophen 325 MG tablet Commonly known as: TYLENOL Take 500 mg by mouth every 6 (six) hours as needed for mild pain.   albuterol 108 (90 Base) MCG/ACT inhaler Commonly known as: VENTOLIN HFA Inhale 1-2 puffs into the lungs every 6 (six) hours as needed for wheezing or shortness of breath.   amLODipine 2.5 MG tablet Commonly known as: NORVASC Take 1 tablet (2.5 mg total) by mouth 2 (two) times daily.   ascorbic acid 500 MG tablet Commonly known as: VITAMIN C Take 500 mg by mouth daily.   Auvi-Q 0.3 mg/0.3 mL Soaj injection Generic drug: EPINEPHrine Inject 0.3 mLs (0.3 mg total) into the muscle as needed for anaphylaxis.   cholecalciferol 25 MCG (1000 UNIT) tablet Commonly known as: VITAMIN D3 Take 1,000 Units by mouth daily.   DULoxetine 20 MG capsule Commonly known as: Cymbalta Take 1 capsule (20 mg total) by mouth daily.   fluticasone 50 MCG/ACT nasal spray Commonly known as: FLONASE Place 1 spray into both nostrils as needed for allergies.   guaiFENesin 600 MG 12 hr tablet Commonly known as: MUCINEX Take by mouth as needed.   hydrocortisone-pramoxine rectal foam Commonly known as: PROCTOFOAM-HC Place 1 applicator rectally 2 (two) times daily.   ipratropium-albuterol 0.5-2.5  (3) MG/3ML Soln Commonly known as: DUONEB Take 3 mLs by nebulization every 4 (four) hours as needed.   multivitamin capsule Take 1 capsule by mouth daily.   Symbicort 160-4.5 MCG/ACT inhaler Generic drug: budesonide-formoterol Inhale 2 puffs into the lungs 2 (two) times daily.        Birth History: {Blank single:19197::"non-contributory","born premature and spent time in the NICU","born at term without complications"}  Developmental History: Kayly has met all milestones on time. She has required no {Blank multiple:19196:a:"speech therapy","occupational therapy","physical therapy"}. ***non-contributory  Past Surgical History: Past Surgical History:  Procedure Laterality Date  . ABDOMINAL EXPLORATION SURGERY  1980  . CARDIAC CATHETERIZATION  04/2010   with no evidence of ischemia or significant coronary disease to speak of, normal LV function with relatively normal EDP.  Marland Kitchen LEFT HEART CATHETERIZATION WITH CORONARY ANGIOGRAM  N/A 03/18/2013   Procedure: LEFT HEART CATHETERIZATION WITH CORONARY ANGIOGRAM;  Surgeon: Kathleene Hazel, MD;  Location: [facility] CATH LAB;  Service: Cardiovascular;  Laterality: N/A;  . NASAL SINUS SURGERY       Family History: Family History  Problem Relation Age of Onset  . Hypertension Mother   . Asthma Mother   . Anemia Mother   . Hypertension Father   . Stroke Father   . Hypertension Sister   . Atrial fibrillation Brother   . Stroke Brother   . Hypertension Brother   . Stroke Maternal Grandmother   . Diabetes Maternal Grandmother   . Cancer Maternal Grandfather   . Diabetes Paternal Grandmother   . Heart attack Paternal Grandfather   . Cancer Paternal Grandfather   . Diabetes Paternal Grandfather   . Hypertension Other   . Colon cancer Neg Hx   . Esophageal cancer Neg Hx   . Inflammatory bowel disease Neg Hx   . Liver disease Neg Hx   . Pancreatic cancer Neg Hx   . Rectal cancer Neg Hx      Social History: Jamariyah lives at home with  ***.  She lives in a townhome.  It is 66 years old.  There is hardwood throughout the home.  They have gas heating and central cooling.  There are no animals inside or outside of the home.  There are dust mite covers on the bed as well as the pillow.  There is no tobacco exposure.  She currently works as a Runner, broadcasting/film/video.  She is not exposed to fumes, chemicals, or dust.  She does not own an interstate or industrial area.  There is no tobacco exposure.   ROS     Objective:   Blood pressure 114/72, pulse 70, temperature 98.7 F (37.1 C), resp. rate 16, height 5\' 1"  (1.549 m), weight 150 lb 8 oz (68.3 kg), last menstrual period 11/16/2009, SpO2 100 %. Body mass index is 28.44 kg/m.     Physical Exam   Diagnostic studies: {Blank single:19197::"none","deferred due to recent antihistamine use","deferred due to insurance stipulations that refuse to pay for testing at initial visits, making it more difficult for patients to get the care they need","labs sent instead"," "}  Spirometry: {Blank single:19197::"results normal (FEV1: ***%, FVC: ***%, FEV1/FVC: ***%)","results abnormal (FEV1: ***%, FVC: ***%, FEV1/FVC: ***%)"}.    {Blank single:19197::"Spirometry consistent with mild obstructive disease","Spirometry consistent with moderate obstructive disease","Spirometry consistent with severe obstructive disease","Spirometry consistent with possible restrictive disease","Spirometry consistent with mixed obstructive and restrictive disease","Spirometry uninterpretable due to technique","Spirometry consistent with normal pattern"}. {Blank single:19197::"Albuterol/Atrovent nebulizer","Xopenex/Atrovent nebulizer","Albuterol nebulizer","Albuterol four puffs via MDI","Xopenex four puffs via MDI"} treatment given in clinic with {Blank single:19197::"significant improvement in FEV1 per ATS criteria","significant improvement in FVC per ATS criteria","significant improvement in FEV1 and FVC per ATS criteria","improvement  in FEV1, but not significant per ATS criteria","improvement in FVC, but not significant per ATS criteria","improvement in FEV1 and FVC, but not significant per ATS criteria","no improvement"}.  Allergy Studies: {Blank single:19197::"none","labs sent instead"," "}    {Blank single:19197::"Allergy testing results were read and interpreted by myself, documented by clinical staff."," "}         Malachi Bonds, MD [facility]

## 2022-10-15 ENCOUNTER — Encounter: Payer: Self-pay | Admitting: Allergy & Immunology

## 2022-10-17 ENCOUNTER — Telehealth: Payer: Self-pay | Admitting: Allergy & Immunology

## 2022-10-17 ENCOUNTER — Other Ambulatory Visit: Payer: Self-pay

## 2022-10-17 DIAGNOSIS — T7840XD Allergy, unspecified, subsequent encounter: Secondary | ICD-10-CM | POA: Diagnosis not present

## 2022-10-17 DIAGNOSIS — R42 Dizziness and giddiness: Secondary | ICD-10-CM

## 2022-10-17 LAB — ALPHA-GAL PANEL
Allergen Lamb IgE: 0.17 kU/L — AB
Beef IgE: <0.1 kU/L
IgE (Immunoglobulin E), Serum: 53 IU/mL (ref 6–495)
O215-IgE Alpha-Gal: <0.1 kU/L
Pork IgE: <0.1 kU/L

## 2022-10-17 LAB — ALLERGEN, WHITE POTATO,F35: Allergen Potato, White IgE: <0.1 kU/L

## 2022-10-17 LAB — ALLERGEN AVOCADO F96: F096-IgE Avocado: <0.1 kU/L

## 2022-10-17 LAB — ALLERGEN, RICE, F9: Allergen Rice IgE: <0.1 kU/L

## 2022-10-17 LAB — ALLERGEN, WHEAT, F4: Wheat IgE: <0.1 kU/L

## 2022-10-17 MED ORDER — LEVOCETIRIZINE DIHYDROCHLORIDE 5 MG PO TABS: 5.0000 mg | ORAL_TABLET | Freq: Every evening | ORAL | 5 refills | Status: DC

## 2022-10-17 MED ORDER — MONTELUKAST SODIUM 10 MG PO TABS: 10.0000 mg | ORAL_TABLET | Freq: Every day | ORAL | 5 refills | Status: DC

## 2022-10-17 NOTE — Addendum Note (Signed)
Addended by: Orson Aloe on: 10/17/2022 01:44 PM   Modules accepted: Orders

## 2022-10-17 NOTE — Telephone Encounter (Signed)
LVM for pt to call back before 5pm today [phone]

## 2022-10-17 NOTE — Telephone Encounter (Signed)
Medication has been sent in and patient has been notified.

## 2022-10-17 NOTE — Telephone Encounter (Signed)
Patient came in and stated her singular prescription was not called in as she stated that's what the pharmacy told her. Patient would like it sent over to [facility] on [address].  Best Conatct: [phone]

## 2022-10-17 NOTE — Telephone Encounter (Signed)
Sent in MRA Referral 10/17/2022

## 2022-10-19 LAB — OTHER LAB TEST

## 2022-10-20 LAB — N-METHYLHISTAMINE, 24 HR, U: Creatinine, 24 Hour, U: 837 mg/24 h (ref 603–1783)

## 2022-10-24 ENCOUNTER — Telehealth: Payer: Self-pay | Admitting: Neurology

## 2022-10-24 LAB — N-METHYLHISTAMINE, 24 HR, U
Collection Duration: 24 h
Creatinine Concent. 24 Hr, U: 31 mg/dL
N-Methylhistamine, 24 Hr, U: 136 mcg/g Cr (ref 30–200)
Urine Volume: 2700 mL

## 2022-10-24 NOTE — Telephone Encounter (Signed)
[employer] auth: Tracking #[id number] exp. 10/24/2022 - 12/23/2022 sent to GI [phone]

## 2022-10-25 DIAGNOSIS — F4322 Adjustment disorder with anxiety: Secondary | ICD-10-CM | POA: Diagnosis not present

## 2022-10-27 LAB — OTHER LAB TEST: PDF: 0

## 2022-10-28 LAB — CHRONIC URTICARIA: cu index: <5.6 (ref <10)

## 2022-10-28 LAB — KIT (D816V) DIGITAL PCR: CKIT Result: NEGATIVE

## 2022-10-28 LAB — THYROID ANTIBODIES
Thyroglobulin Antibody: <1 IU/mL (ref 0.0–0.9)
Thyroperoxidase Ab SerPl-aCnc: <9 IU/mL (ref 0–34)

## 2022-10-28 LAB — PROSTAGLANDIN D2, SERUM: Prostaglandin D2, serum: 19 pg/mL

## 2022-10-28 LAB — ANTINUCLEAR ANTIBODIES, IFA: ANA Titer 1: NEGATIVE

## 2022-10-28 LAB — TRYPTASE: Tryptase: 8.8 ug/L (ref 2.2–13.2)

## 2022-10-28 LAB — SEDIMENTATION RATE: Sed Rate: 14 mm/hr (ref 0–40)

## 2022-10-28 LAB — IGE: IgE (Immunoglobulin E), Serum: 57 IU/mL (ref 6–495)

## 2022-10-28 LAB — C-REACTIVE PROTEIN: CRP: <1 mg/L (ref 0–10)

## 2022-10-31 DIAGNOSIS — R079 Chest pain, unspecified: Secondary | ICD-10-CM | POA: Diagnosis not present

## 2022-10-31 DIAGNOSIS — R5381 Other malaise: Secondary | ICD-10-CM | POA: Diagnosis not present

## 2022-10-31 DIAGNOSIS — R069 Unspecified abnormalities of breathing: Secondary | ICD-10-CM | POA: Diagnosis not present

## 2022-11-01 ENCOUNTER — Ambulatory Visit (HOSPITAL_COMMUNITY): Payer: Medicare PPO | Attending: Cardiology

## 2022-11-01 ENCOUNTER — Telehealth: Payer: Self-pay | Admitting: Psychiatry

## 2022-11-01 DIAGNOSIS — R42 Dizziness and giddiness: Secondary | ICD-10-CM | POA: Diagnosis not present

## 2022-11-01 DIAGNOSIS — I358 Other nonrheumatic aortic valve disorders: Secondary | ICD-10-CM

## 2022-11-01 DIAGNOSIS — I2510 Atherosclerotic heart disease of native coronary artery without angina pectoris: Secondary | ICD-10-CM | POA: Insufficient documentation

## 2022-11-01 DIAGNOSIS — I10 Essential (primary) hypertension: Secondary | ICD-10-CM | POA: Insufficient documentation

## 2022-11-01 DIAGNOSIS — I517 Cardiomegaly: Secondary | ICD-10-CM

## 2022-11-01 LAB — ECHOCARDIOGRAM COMPLETE
Area-P 1/2: 2.77 cm2
S' Lateral: 2.1 cm

## 2022-11-01 NOTE — Telephone Encounter (Signed)
I called patient and discussed the below, the light headed and balance issues do not appear to be a new complaint. Pt called before about and MRI neck has been ordered. Pt said she noticed this am her left foot feel funny when stepping down, reports her foot is able to touch the floor but feels like it is not touching the floor at times. MRI scheduled on 11/14/22, I advised patient to check to see if MRI could be moved up with [facility].

## 2022-11-01 NOTE — Telephone Encounter (Signed)
Pt is asking for a call to discuss balance issues and feeling of being light headed, also concerns about left foot and stepping down.

## 2022-11-02 LAB — LEUKOTRIENE E4, 24 HR, U
Collection Duration: 24 h
Creatinine Concentration,24 HR: 29 mg/dL
Creatinine, 24 HR, U: 783 mg/24 h (ref 603–1783)
Leukotriene E4, U: 107 pg/mg Cr — ABNORMAL HIGH (ref <=104)
Urine Volume: 2700 mL

## 2022-11-04 DIAGNOSIS — E559 Vitamin D deficiency, unspecified: Secondary | ICD-10-CM | POA: Diagnosis not present

## 2022-11-04 DIAGNOSIS — T7840XA Allergy, unspecified, initial encounter: Secondary | ICD-10-CM | POA: Diagnosis not present

## 2022-11-04 DIAGNOSIS — I10 Essential (primary) hypertension: Secondary | ICD-10-CM | POA: Diagnosis not present

## 2022-11-04 DIAGNOSIS — G479 Sleep disorder, unspecified: Secondary | ICD-10-CM | POA: Diagnosis not present

## 2022-11-04 DIAGNOSIS — R041 Hemorrhage from throat: Secondary | ICD-10-CM | POA: Diagnosis not present

## 2022-11-04 DIAGNOSIS — L98499 Non-pressure chronic ulcer of skin of other sites with unspecified severity: Secondary | ICD-10-CM | POA: Diagnosis not present

## 2022-11-04 DIAGNOSIS — E041 Nontoxic single thyroid nodule: Secondary | ICD-10-CM | POA: Diagnosis not present

## 2022-11-04 DIAGNOSIS — E039 Hypothyroidism, unspecified: Secondary | ICD-10-CM | POA: Diagnosis not present

## 2022-11-04 DIAGNOSIS — E782 Mixed hyperlipidemia: Secondary | ICD-10-CM | POA: Diagnosis not present

## 2022-11-08 ENCOUNTER — Ambulatory Visit: Payer: Medicare PPO | Admitting: Allergy & Immunology

## 2022-11-08 ENCOUNTER — Encounter: Payer: Self-pay | Admitting: Allergy & Immunology

## 2022-11-08 ENCOUNTER — Other Ambulatory Visit: Payer: Self-pay

## 2022-11-08 VITALS — BP 116/70 | HR 61 | Temp 98.3°F | Resp 16

## 2022-11-08 DIAGNOSIS — T7800XD Anaphylactic reaction due to unspecified food, subsequent encounter: Secondary | ICD-10-CM

## 2022-11-08 DIAGNOSIS — T7840XD Allergy, unspecified, subsequent encounter: Secondary | ICD-10-CM | POA: Diagnosis not present

## 2022-11-08 DIAGNOSIS — I10 Essential (primary) hypertension: Secondary | ICD-10-CM

## 2022-11-08 DIAGNOSIS — T7840XA Allergy, unspecified, initial encounter: Secondary | ICD-10-CM

## 2022-11-08 MED ORDER — EPINEPHRINE 0.3 MG/0.3ML IJ SOAJ: 0.3000 mg | INTRAMUSCULAR | 2 refills | Status: AC | PRN

## 2022-11-08 NOTE — Patient Instructions (Addendum)
1. Allergic reactions to multiple medications - The only thing that was abnormal was a slightly elevated leukotriene level (107 with a normal result of 104 or below). - We will get some labs to look for a peanut and tree nut allergy.   - Continue with Xyzal 5 mg daily (H1 blocker)  - Consider baby aspirin in the future (would block the leukotriene production).   2. Return in about 6 months (around 05/11/2023).    Please inform us of any Emergency Department visits, hospitalizations, or changes in symptoms. Call us before going to the ED for breathing or allergy symptoms since we might be able to fit you in for a sick visit. Feel free to contact us anytime with any questions, problems, or concerns.  It was a pleasure to see you again today!  Websites that have reliable patient information: 1. American Academy of Asthma, Allergy, and Immunology: www.aaaai.org 2. Food Allergy Research and Education (FARE): foodallergy.org 3. Mothers of Asthmatics: http://www.asthmacommunitynetwork.org 4. American College of Allergy, Asthma, and Immunology: www.acaai.org   COVID-19 Vaccine Information can be found at: [email or web address] For questions related to vaccine distribution or appointments, please email [email or web address] or call [phone].   We realize that you might be concerned about having an allergic reaction to the COVID19 vaccines. To help with that concern, WE ARE OFFERING THE COVID19 VACCINES IN OUR OFFICE! Ask the front desk for dates!     "Like" [place] on Facebook and Instagram for our latest updates!      A healthy democracy works best when [employer] participate! Make sure you are registered to vote! If you have moved or changed any of your contact information, you will need to get this updated before voting!  In some cases, you MAY be able to register to vote online:  [email or web address]

## 2022-11-08 NOTE — Progress Notes (Signed)
FOLLOW UP  Date of Service/Encounter:  11/08/22   Assessment:   Allergic reactions to multiple medications - sending several labs today to rule out mastocytosis   Food allergies (shellfish, wheat, rice, red meats, avocado, potato) - negative testing   New concern for tree nut allergies - testing today   Complex past medical history including hypertension, coronary heart disease, and abdominal pain   Disabled status  Plan/Recommendations:   1. Allergic reactions to multiple medications - The only thing that was abnormal was a slightly elevated leukotriene level (107 with a normal result of 104 or below). - We will get some labs to look for a peanut and tree nut allergy.   - Continue with Xyzal 5 mg daily (H1 blocker)  - Consider baby aspirin in the future (would block the leukotriene production).   2. Return in about 6 months (around 05/11/2023).    Subjective:   Bianca [name] is a 67 y.o. female presenting today for follow up of  Chief Complaint  Patient presents with   Medication Management    Bianca [name] has a history of the following: Patient Active Problem List   Diagnosis Date Noted   Coronary vasospasm (HCC) 05/18/2021   Premature atrial contractions 05/18/2021   Lower extremity edema 05/18/2021   Hemorrhoids 05/18/2021   Rectal pain 03/23/2021   Change in bowel habits 03/23/2021   RLQ abdominal pain 03/23/2021   Right flank pain 03/23/2021   Acute midline thoracic back pain 03/23/2021   Chest pain 03/13/2013   Abnormal stress ECG with treadmill 02/24/2013   HTN (hypertension) 12/24/2011   Heart palpitations 12/24/2011   Awareness of heartbeats 11/30/2011    History obtained from: chart review and patient.  Bianca [name] is a 67 y.o. female presenting for a follow up visit.  We last saw her in June 2024.  At that time, we sent a number of labs to rule out mast cell activation syndrome.  We continue with Pepcid twice daily and started Xyzal 5 mg daily  and singular 10 mg daily.  We did a slew of labs.  Her leukotriene E4 urinary level was slightly elevated at 107 (normal less than 104).  Her alpha gal panel was barely positive to lamb at 0.17.  The remainder of her labs were completely normal including an N-methylhistamine, C-kit mutation, thyroid antibodies, tryptase, inflammatory markers, chronic urticaria panel, ANA, prostaglandin D2, IgE level, and multiple foods including potato, avocado, and rice.  Since the last visit, she has done well. She did not tolerate the Singulair. She remains on the Xyzal once daily which she thinks is helping. She has not had any severe reactions at all since the last visit.   Food Allergy Symptom History: She is not sure that she is having trouble with nuts. She will "feel bad" after eating them. She is not sure whether this is consistnet (not hives, rash, or other issues). She does get tight in her chest with ingestion of tree nuts.   Otherwise, there have been no changes to her past medical history, surgical history, family history, or social history.    Review of systems otherwise negative other than that mentioned in the HPI.    Objective:   Blood pressure 116/70, pulse 61, temperature 98.3 F (36.8 C), temperature source Temporal, resp. rate 16, last menstrual period 11/16/2009, SpO2 98%. There is no height or weight on file to calculate BMI.    Physical Exam Vitals reviewed.  Constitutional:  Appearance: She is well-developed.     Comments: Pleasant. Talkative. Well appearing.  HENT:     Head: Normocephalic and atraumatic.     Right Ear: Tympanic membrane, ear canal and external ear normal. No drainage, swelling or tenderness. Tympanic membrane is not injected, scarred, erythematous, retracted or bulging.     Left Ear: Tympanic membrane, ear canal and external ear normal. No drainage, swelling or tenderness. Tympanic membrane is not injected, scarred, erythematous, retracted or bulging.      Nose: No nasal deformity, septal deviation, mucosal edema or rhinorrhea.     Right Sinus: No maxillary sinus tenderness or frontal sinus tenderness.     Left Sinus: No maxillary sinus tenderness or frontal sinus tenderness.     Mouth/Throat:     Mouth: Mucous membranes are not pale and not dry.     Pharynx: Uvula midline.  Eyes:     General:        Right eye: No discharge.        Left eye: No discharge.     Conjunctiva/sclera: Conjunctivae normal.     Right eye: Right conjunctiva is not injected. No chemosis.    Left eye: Left conjunctiva is not injected. No chemosis.    Pupils: Pupils are equal, round, and reactive to light.  Cardiovascular:     Rate and Rhythm: Normal rate and regular rhythm.     Heart sounds: Normal heart sounds.  Pulmonary:     Effort: Pulmonary effort is normal. No tachypnea, accessory muscle usage or respiratory distress.     Breath sounds: Normal breath sounds. No wheezing, rhonchi or rales.  Chest:     Chest wall: No tenderness.  Abdominal:     Tenderness: There is no abdominal tenderness. There is no guarding or rebound.  Lymphadenopathy:     Head:     Right side of head: No submandibular, tonsillar or occipital adenopathy.     Left side of head: No submandibular, tonsillar or occipital adenopathy.     Cervical: No cervical adenopathy.  Skin:    Coloration: Skin is not pale.     Findings: No abrasion, erythema, petechiae or rash. Rash is not papular, urticarial or vesicular.  Neurological:     Mental Status: She is alert.  Psychiatric:        Behavior: Behavior is cooperative.      Diagnostic studies: labs sent instead       Bianca Bonds, MD  [facility]

## 2022-11-09 ENCOUNTER — Telehealth: Payer: Self-pay | Admitting: Cardiovascular Disease

## 2022-11-09 NOTE — Telephone Encounter (Signed)
Patient calling for results from echo. Please advise

## 2022-11-09 NOTE — Telephone Encounter (Signed)
Pt informed of echo findings. She appreciates the callback.

## 2022-11-10 DIAGNOSIS — F4322 Adjustment disorder with anxiety: Secondary | ICD-10-CM | POA: Diagnosis not present

## 2022-11-12 ENCOUNTER — Encounter: Payer: Self-pay | Admitting: Allergy & Immunology

## 2022-11-12 DIAGNOSIS — T782XXD Anaphylactic shock, unspecified, subsequent encounter: Secondary | ICD-10-CM

## 2022-11-14 ENCOUNTER — Other Ambulatory Visit: Payer: Medicare PPO

## 2022-11-16 ENCOUNTER — Ambulatory Visit: Admission: RE | Admit: 2022-11-16 | Payer: Medicare PPO | Source: Ambulatory Visit

## 2022-11-16 DIAGNOSIS — R42 Dizziness and giddiness: Secondary | ICD-10-CM

## 2022-11-16 DIAGNOSIS — E079 Disorder of thyroid, unspecified: Secondary | ICD-10-CM | POA: Diagnosis not present

## 2022-11-16 MED ORDER — GADOPICLENOL 0.5 MMOL/ML IV SOLN
5.0000 mL | Freq: Once | INTRAVENOUS | Status: AC | PRN
  Administered 2022-11-16: 5 mL via INTRAVENOUS

## 2022-11-17 DIAGNOSIS — I10 Essential (primary) hypertension: Secondary | ICD-10-CM | POA: Diagnosis not present

## 2022-11-17 DIAGNOSIS — E1169 Type 2 diabetes mellitus with other specified complication: Secondary | ICD-10-CM | POA: Diagnosis not present

## 2022-11-17 DIAGNOSIS — E785 Hyperlipidemia, unspecified: Secondary | ICD-10-CM | POA: Diagnosis not present

## 2022-11-18 ENCOUNTER — Telehealth: Payer: Self-pay | Admitting: *Deleted

## 2022-11-18 DIAGNOSIS — R1010 Upper abdominal pain, unspecified: Secondary | ICD-10-CM | POA: Diagnosis not present

## 2022-11-18 DIAGNOSIS — K279 Peptic ulcer, site unspecified, unspecified as acute or chronic, without hemorrhage or perforation: Secondary | ICD-10-CM | POA: Diagnosis not present

## 2022-11-18 NOTE — Telephone Encounter (Signed)
L/m for patient to contact me to discuss xolair

## 2022-11-24 DIAGNOSIS — F4322 Adjustment disorder with anxiety: Secondary | ICD-10-CM | POA: Diagnosis not present

## 2022-11-28 DIAGNOSIS — G4700 Insomnia, unspecified: Secondary | ICD-10-CM | POA: Diagnosis not present

## 2022-11-28 DIAGNOSIS — E049 Nontoxic goiter, unspecified: Secondary | ICD-10-CM | POA: Diagnosis not present

## 2022-11-28 DIAGNOSIS — R002 Palpitations: Secondary | ICD-10-CM | POA: Diagnosis not present

## 2022-11-28 DIAGNOSIS — J302 Other seasonal allergic rhinitis: Secondary | ICD-10-CM | POA: Diagnosis not present

## 2022-11-28 DIAGNOSIS — I10 Essential (primary) hypertension: Secondary | ICD-10-CM | POA: Diagnosis not present

## 2022-11-28 DIAGNOSIS — F419 Anxiety disorder, unspecified: Secondary | ICD-10-CM | POA: Diagnosis not present

## 2022-11-28 DIAGNOSIS — R208 Other disturbances of skin sensation: Secondary | ICD-10-CM | POA: Diagnosis not present

## 2022-11-28 DIAGNOSIS — E7800 Pure hypercholesterolemia, unspecified: Secondary | ICD-10-CM | POA: Diagnosis not present

## 2022-11-28 DIAGNOSIS — J45909 Unspecified asthma, uncomplicated: Secondary | ICD-10-CM | POA: Diagnosis not present

## 2022-12-01 DIAGNOSIS — K279 Peptic ulcer, site unspecified, unspecified as acute or chronic, without hemorrhage or perforation: Secondary | ICD-10-CM | POA: Diagnosis not present

## 2022-12-05 ENCOUNTER — Encounter: Payer: Self-pay | Admitting: Neurology

## 2022-12-06 NOTE — Telephone Encounter (Signed)
Patient wants to get 2nd option prior to discussing Xolair option

## 2022-12-12 DIAGNOSIS — F4322 Adjustment disorder with anxiety: Secondary | ICD-10-CM | POA: Diagnosis not present

## 2022-12-13 ENCOUNTER — Ambulatory Visit: Payer: Medicare PPO | Admitting: Allergy and Immunology
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
## 2022-12-23 DIAGNOSIS — L309 Dermatitis, unspecified: Secondary | ICD-10-CM | POA: Diagnosis not present

## 2022-12-23 DIAGNOSIS — R109 Unspecified abdominal pain: Secondary | ICD-10-CM | POA: Diagnosis not present

## 2022-12-23 DIAGNOSIS — T50905A Adverse effect of unspecified drugs, medicaments and biological substances, initial encounter: Secondary | ICD-10-CM | POA: Diagnosis not present

## 2022-12-23 DIAGNOSIS — L508 Other urticaria: Secondary | ICD-10-CM | POA: Diagnosis not present

## 2022-12-28 DIAGNOSIS — F4322 Adjustment disorder with anxiety: Secondary | ICD-10-CM | POA: Diagnosis not present

## 2022-12-29 DIAGNOSIS — K5900 Constipation, unspecified: Secondary | ICD-10-CM | POA: Diagnosis not present

## 2022-12-29 DIAGNOSIS — K6289 Other specified diseases of anus and rectum: Secondary | ICD-10-CM | POA: Diagnosis not present

## 2022-12-29 DIAGNOSIS — R1013 Epigastric pain: Secondary | ICD-10-CM | POA: Diagnosis not present

## 2022-12-29 DIAGNOSIS — K644 Residual hemorrhoidal skin tags: Secondary | ICD-10-CM | POA: Diagnosis not present

## 2022-12-30 ENCOUNTER — Other Ambulatory Visit: Payer: Medicare PPO

## 2022-12-30 ENCOUNTER — Ambulatory Visit: Payer: Medicare PPO | Admitting: Neurology

## 2022-12-30 ENCOUNTER — Encounter: Payer: Self-pay | Admitting: Neurology

## 2022-12-30 VITALS — BP 121/70 | HR 68 | Ht 61.0 in | Wt 148.0 lb

## 2022-12-30 DIAGNOSIS — E049 Nontoxic goiter, unspecified: Secondary | ICD-10-CM | POA: Diagnosis not present

## 2022-12-30 DIAGNOSIS — M12811 Other specific arthropathies, not elsewhere classified, right shoulder: Secondary | ICD-10-CM | POA: Diagnosis not present

## 2022-12-30 DIAGNOSIS — M542 Cervicalgia: Secondary | ICD-10-CM

## 2022-12-30 NOTE — Patient Instructions (Addendum)
I will send blood work to look at thyroid function today as I wonder if the area on your neck is the thyroid. You also requested I refer you to endocrinology, so I will do this today.  For your neck pain/tightness, I am referring you to physical therapy.  Please let me know if you have any questions or concerns in the meantime.  The physicians and staff at [facility] are committed to providing excellent care. You may receive a survey requesting feedback about your experience at our office. We strive to receive "very good" responses to the survey questions. If you feel that your experience would prevent you from giving the office a "very good " response, please contact our office to try to remedy the situation. We may be reached at [phone]. Thank you for taking the time out of your busy day to complete the survey.  Jacquelyne Balint, MD [facility]

## 2023-01-03 ENCOUNTER — Other Ambulatory Visit: Payer: Self-pay

## 2023-01-03 ENCOUNTER — Ambulatory Visit: Payer: Medicare PPO | Admitting: Allergy and Immunology

## 2023-01-03 ENCOUNTER — Encounter: Payer: Self-pay | Admitting: Allergy and Immunology

## 2023-01-03 VITALS — BP 112/76 | HR 68 | Temp 98.3°F | Ht 61.0 in | Wt 146.5 lb

## 2023-01-03 DIAGNOSIS — J4520 Mild intermittent asthma, uncomplicated: Secondary | ICD-10-CM

## 2023-01-03 DIAGNOSIS — K2100 Gastro-esophageal reflux disease with esophagitis, without bleeding: Secondary | ICD-10-CM

## 2023-01-03 DIAGNOSIS — J3089 Other allergic rhinitis: Secondary | ICD-10-CM

## 2023-01-03 DIAGNOSIS — F4322 Adjustment disorder with anxiety: Secondary | ICD-10-CM | POA: Diagnosis not present

## 2023-01-03 NOTE — Patient Instructions (Addendum)
  1.  Continue famotidine 20 mg twice a day  2.  Obtain flu vaccine.  Contact clinic if large local reaction  3.  Avoid consumption of chocolate  4.  Continue Xyzal 5 mg 1 time per day if needed  5.  Continue Flonase-2 sprays each nostril 0-7 times per week  6.  Continue albuterol HFA-2 inhalations every 4-6 hours if needed

## 2023-01-03 NOTE — Progress Notes (Unsigned)
[place] - [place] - [place] - [place] - [place]   Follow-up Note  Referring Provider: Tally Joe, MD Primary Provider: Tally Joe, MD Date of Office Visit: 01/03/2023  Subjective:   Bianca [name] (DOB: 1955/08/06) is a 67 y.o. female who [facility] in re-evaluation of the following:  HPI: Riven return to this clinic in reevaluation of allergic rhinoconjunctivitis, dry eye syndrome, ocular rosacea, allergic rhinitis, shellfish allergy.  I have not seen her in this clinic since 11 December 2018.  She has seen Dr. Dellis Anes multiple times for what appears to be recurrent allergic reactions.  Shirlee Limerick wants to address several issues today.  First, she notes that she went to [facility] to see if indeed she did have mast cell activating syndrome and she does not fit the criteria for mast cell activating syndrome.  Second, she states that she received the high-dose flu vaccine last year and developed a very significant large local reaction that went on for days and she wonders if she should get the flu vaccine this year.  Third, it sounds as though she had an ulcer of her stomach and polyps in her stomach and ulcerations in her esophagus for which she underwent a upper endoscopy in February 2024 and then she was treated with doxycycline and what sounds like clarithromycin or amoxicillin and given Prilosec but she ended up developing significant GI upset with doxycycline and a rash on her chest and the other agent that was administered gave rise to a pains in her extremities and the Prilosec gave rise to GI pain and extremity pain and thus she ended up on Pepcid twice a day.  She is doing a lot better at this point in time regarding all of her stomach complaints while using Pepcid twice a day.  She continues to consume dark chocolate commonly.  She is not really having much problems with her allergies while using Xyzal on a daily basis  and some occasional Flonase.  She is not restricting any food at this point in time and is doing well.  She has a distant history of asthma and has been given an albuterol inhaler which she rarely uses.  Allergies as of 01/03/2023       Reactions   Codeine Other (See Comments)   Other reaction(s): Other (See Comments) Pounding in head  Pounding in head    Amoxicillin Other (See Comments)   Diarrhea and vomiting.  Has patient had a PCN reaction causing immediate rash, facial/tongue/throat swelling, SOB or lightheadedness with hypotension: No Has patient had a PCN reaction causing severe rash involving mucus membranes or skin necrosis: No Has patient had a PCN reaction that required hospitalization No Has patient had a PCN reaction occurring within the last 10 years: No If all of the above answers are "NO", then may proceed with Cephalosporin use.   Amoxicillin-pot Clavulanate Other (See Comments)   Diarrhea and vomiting.    Bee Venom    Swelling   Elemental Sulfur    Latex Hives, Other (See Comments)   Metoprolol Other (See Comments)   Hair loss   Milk-related Compounds    Diarrhea & gas   Shellfish Allergy    HIVES, SWELLING, N/V & DIARREHA   Sulfa Antibiotics    Sulfamethoxazole Other (See Comments)        Medication List    acetaminophen 325 MG tablet Commonly known as: TYLENOL Take 500 mg by mouth every 6 (six)  hours as needed for mild pain.   albuterol 108 (90 Base) MCG/ACT inhaler Commonly known as: VENTOLIN HFA Inhale 1-2 puffs into the lungs every 6 (six) hours as needed for wheezing or shortness of breath.   amLODipine 2.5 MG tablet Commonly known as: NORVASC Take 1 tablet (2.5 mg total) by mouth 2 (two) times daily.   ascorbic acid 500 MG tablet Commonly known as: VITAMIN C Take 500 mg by mouth daily.   cholecalciferol 25 MCG (1000 UNIT) tablet Commonly known as: VITAMIN D3 Take 1,000 Units by mouth daily.   DULoxetine 20 MG capsule Commonly known  as: Cymbalta Take 1 capsule (20 mg total) by mouth daily.   EPINEPHrine 0.3 mg/0.3 mL Soaj injection Commonly known as: EPI-PEN Inject 0.3 mg into the muscle as needed for anaphylaxis.   famotidine 20 MG tablet Commonly known as: PEPCID Take 20 mg by mouth daily.   fluticasone 50 MCG/ACT nasal spray Commonly known as: FLONASE Place 1 spray into both nostrils as needed for allergies.   guaiFENesin 600 MG 12 hr tablet Commonly known as: MUCINEX Take by mouth as needed.   hydrocortisone-pramoxine rectal foam Commonly known as: PROCTOFOAM-HC Place 1 applicator rectally 2 (two) times daily.   ipratropium-albuterol 0.5-2.5 (3) MG/3ML Soln Commonly known as: DUONEB Take 3 mLs by nebulization every 4 (four) hours as needed.   levocetirizine 5 MG tablet Commonly known as: XYZAL Take 1 tablet (5 mg total) by mouth every evening.   lidocaine 5 % ointment Commonly known as: XYLOCAINE Apply 1 Application topically daily as needed.   multivitamin capsule Take 1 capsule by mouth daily.    Past Medical History:  Diagnosis Date   Allergy    Anemia    Anxiety    Asthma    Eczema    H/O exercise stress test 2008; October 2014   was normal in 2008; positive for ischemia with inferior and lateral ST depressions October 2014   Hx of echocardiogram 091/2012   relatively normal as well. No significant valvar lesions. Normal Ef.   Hypertension    Lower extremity edema 05/18/2021   PRN Furosemide   Migraines    Palpitations     Past Surgical History:  Procedure Laterality Date   ABDOMINAL EXPLORATION SURGERY  1980   CARDIAC CATHETERIZATION  04/2010   with no evidence of ischemia or significant coronary disease to speak of, normal LV function with relatively normal EDP.   LEFT HEART CATHETERIZATION WITH CORONARY ANGIOGRAM N/A 03/18/2013   Procedure: LEFT HEART CATHETERIZATION WITH CORONARY ANGIOGRAM;  Surgeon: Kathleene Hazel, MD;  Location: [facility] CATH LAB;  Service:  Cardiovascular;  Laterality: N/A;   NASAL SINUS SURGERY      Review of systems negative except as noted in HPI / PMHx or noted below:  Review of Systems  Constitutional: Negative.   HENT: Negative.    Eyes: Negative.   Respiratory: Negative.    Cardiovascular: Negative.   Gastrointestinal: Negative.   Genitourinary: Negative.   Musculoskeletal: Negative.   Skin: Negative.   Neurological: Negative.   Endo/Heme/Allergies: Negative.   Psychiatric/Behavioral: Negative.       Objective:   Vitals:   01/03/23 1648  BP: 112/76  Pulse: 68  Temp: 98.3 F (36.8 C)  SpO2: 98%   Height: 5\' 1"  (154.9 cm)  Weight: 146 lb 8 oz (66.5 kg)   Physical Exam Constitutional:      Appearance: She is not diaphoretic.  HENT:     Head: Normocephalic.  Right Ear: Tympanic membrane, ear canal and external ear normal.     Left Ear: Tympanic membrane, ear canal and external ear normal.     Nose: Nose normal. No mucosal edema or rhinorrhea.     Mouth/Throat:     Pharynx: Uvula midline. No oropharyngeal exudate.  Eyes:     Conjunctiva/sclera: Conjunctivae normal.  Neck:     Thyroid: No thyromegaly.     Trachea: Trachea normal. No tracheal tenderness or tracheal deviation.  Cardiovascular:     Rate and Rhythm: Normal rate and regular rhythm.     Heart sounds: Normal heart sounds, S1 normal and S2 normal. No murmur heard. Pulmonary:     Effort: No respiratory distress.     Breath sounds: Normal breath sounds. No stridor. No wheezing or rales.  Lymphadenopathy:     Head:     Right side of head: No tonsillar adenopathy.     Left side of head: No tonsillar adenopathy.     Cervical: No cervical adenopathy.  Skin:    Findings: No erythema or rash.     Nails: There is no clubbing.  Neurological:     Mental Status: She is alert.     Diagnostics: Spirometry was performed and demonstrated an FEV1 of 1.62 at 90 % of predicted.   Assessment and Plan:   1. Other allergic rhinitis   2.  Asthma, mild intermittent, well-controlled   3. Gastroesophageal reflux disease with esophagitis without hemorrhage    1.  Continue famotidine 20 mg twice a day  2.  Obtain flu vaccine.  Contact clinic if large local reaction  3.  Avoid consumption of chocolate  4.  Continue Xyzal 5 mg 1 time per day if needed  5.  Continue Flonase-2 sprays each nostril 0-7 times per week  6.  Continue albuterol HFA-2 inhalations every 4-6 hours if needed  Hypatia just went through a rather significant issue tied up with what appears to be gastritis and esophagitis and she is doing better while using her famotidine twice a day and I told her that she probably needs to remain on that agent for at least the next 6 months if not a year.  And she should avoid chocolate consumption as that hurts her 1 source of caffeine and this will precipitate reflux.  She had a fair amount of side effects when using various medications including doxycycline and possibly another form of antibiotic they gave rise to arm pains and GI upset.  I have informed her that this was most likely not an allergic reaction and just an intolerance of using these medications.  She had a large local reaction to flu vaccine and with her upcoming flu vaccine if she develops another large local reaction she can contact me and we will give her a low-dose of prednisone to help resolve that large local reaction.  She can treat her atopic respiratory disease as noted above.  Will see her back in this clinic in a year or earlier if there is a problem.  Laurette Schimke, MD Allergy / Immunology [facility]

## 2023-01-05 ENCOUNTER — Telehealth: Payer: Self-pay | Admitting: Psychiatry

## 2023-01-05 NOTE — Telephone Encounter (Signed)
Pt cancelled appt with Dr. Vickey Huger due to scheduling conflict. Pt said will call back to reschedule.

## 2023-01-09 DIAGNOSIS — R399 Unspecified symptoms and signs involving the genitourinary system: Secondary | ICD-10-CM | POA: Diagnosis not present

## 2023-01-09 DIAGNOSIS — R350 Frequency of micturition: Secondary | ICD-10-CM | POA: Diagnosis not present

## 2023-01-09 DIAGNOSIS — R319 Hematuria, unspecified: Secondary | ICD-10-CM | POA: Diagnosis not present

## 2023-01-13 LAB — TSH+T4F+T3FREE

## 2023-01-16 ENCOUNTER — Telehealth: Payer: Self-pay | Admitting: Neurology

## 2023-01-16 NOTE — Telephone Encounter (Signed)
Called patient as there was a problem in the lab drawing her TSH and free T3/T4. Patient has still not heard from endocrinology regarding an appointment. I will look into this. After discussion, patient agreed to see endocrinology and see what testing needed to be done instead of redrawing the thyroid function testing.  All questions were answered.  Jacquelyne Balint, MD [facility]rology

## 2023-01-17 ENCOUNTER — Other Ambulatory Visit: Payer: Self-pay

## 2023-01-17 ENCOUNTER — Telehealth: Payer: Self-pay

## 2023-01-17 DIAGNOSIS — E049 Nontoxic goiter, unspecified: Secondary | ICD-10-CM

## 2023-01-17 NOTE — Telephone Encounter (Signed)
Re-faxed referral.

## 2023-01-17 NOTE — Telephone Encounter (Signed)
-----   Message from Antony Madura sent at 01/16/2023  4:26 PM EDT ----- Regarding: Endocrinology referral Sherril Shipman,  This patient has still not heard from endocrinology that we referred her to see. Can you check on this for her?  Thank you,  Riki Rusk

## 2023-01-20 DIAGNOSIS — F4322 Adjustment disorder with anxiety: Secondary | ICD-10-CM | POA: Diagnosis not present

## 2023-01-23 ENCOUNTER — Telehealth: Payer: Self-pay

## 2023-01-23 ENCOUNTER — Ambulatory Visit: Payer: Self-pay | Admitting: General Surgery

## 2023-01-23 DIAGNOSIS — K642 Third degree hemorrhoids: Secondary | ICD-10-CM | POA: Diagnosis not present

## 2023-01-23 NOTE — Progress Notes (Unsigned)
r   No chief complaint on file.  History of Present Illness: 67 yo female with history of chest pain, HTN, asthma, anxiety and suspicion for coronary vasospasm who is here today for cardiac follow up. She has had chronic chest pain with a normal cardiac cath in 2012 and again in 2014. 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 Echo January 2012 with normal LV function, no valve issues. She has been on Norvasc and has tolerated this well. I saw her in March 2019 and she c/o mild edema. I ordered an echo and started Lasix. She cancelled the echo. I saw her 06/15/20 and she was doing well with occasional LE edema. Echo was planned but not yet completed. I saw her in March 2022 and she c/o palpitations. Cardiac monitor April 2022 with sinus with PACs. She called our office in early November 2023 and reported that she wanted to stop Norvasc as she felt that she had been on it too long. She was started on Losartan but she felt weak on this.   She is here today for follow up. The patient denies any chest pain, dyspnea, palpitations, lower extremity edema, orthopnea, PND, dizziness, near syncope or syncope.     Primary Care Physician: Tally Joe, MD  Past Medical History:  Diagnosis Date   Allergy    Anemia    Anxiety    Asthma    Eczema    H/O exercise stress test 2008; October 2014   was normal in 2008; positive for ischemia with inferior and lateral ST depressions October 2014   Hx of echocardiogram 091/2012   relatively normal as well. No significant valvar lesions. Normal Ef.   Hypertension    Lower extremity edema 05/18/2021   PRN Furosemide   Migraines    Palpitations     Past Surgical History:  Procedure Laterality Date   ABDOMINAL EXPLORATION SURGERY  1980   CARDIAC CATHETERIZATION  04/2010   with no evidence of ischemia or significant coronary disease to speak of, normal LV function with relatively normal EDP.   LEFT HEART CATHETERIZATION WITH CORONARY ANGIOGRAM N/A 03/18/2013    Procedure: LEFT HEART CATHETERIZATION WITH CORONARY ANGIOGRAM;  Surgeon: Kathleene Hazel, MD;  Location: [facility] CATH LAB;  Service: Cardiovascular;  Laterality: N/A;   NASAL SINUS SURGERY      Current Outpatient Medications  Medication Sig Dispense Refill   acetaminophen (TYLENOL) 325 MG tablet Take 500 mg by mouth every 6 (six) hours as needed for mild pain.      albuterol (VENTOLIN HFA) 108 (90 Base) MCG/ACT inhaler Inhale 1-2 puffs into the lungs every 6 (six) hours as needed for wheezing or shortness of breath.     amLODipine (NORVASC) 2.5 MG tablet Take 1 tablet (2.5 mg total) by mouth 2 (two) times daily. 180 tablet 3   ascorbic acid (VITAMIN C) 500 MG tablet Take 500 mg by mouth daily.     cholecalciferol (VITAMIN D3) 25 MCG (1000 UNIT) tablet Take 1,000 Units by mouth daily.     DULoxetine (CYMBALTA) 20 MG capsule Take 1 capsule (20 mg total) by mouth daily. (Patient not taking: Reported on 11/08/2022) 30 capsule 6   EPINEPHrine 0.3 mg/0.3 mL IJ SOAJ injection Inject 0.3 mg into the muscle as needed for anaphylaxis. 0.3 mL 2   famotidine (PEPCID) 20 MG tablet Take 20 mg by mouth daily.     fluticasone (FLONASE) 50 MCG/ACT nasal spray Place 1 spray into both nostrils as needed for allergies.  guaiFENesin (MUCINEX) 600 MG 12 hr tablet Take by mouth as needed.      hydrocortisone-pramoxine (PROCTOFOAM-HC) rectal foam Place 1 applicator rectally 2 (two) times daily. (Patient not taking: Reported on 11/08/2022) 30 g 0   ipratropium-albuterol (DUONEB) 0.5-2.5 (3) MG/3ML SOLN Take 3 mLs by nebulization every 4 (four) hours as needed. 3 mL 1   levocetirizine (XYZAL) 5 MG tablet Take 1 tablet (5 mg total) by mouth every evening. 30 tablet 5   lidocaine (XYLOCAINE) 5 % ointment Apply 1 Application topically daily as needed.     Multiple Vitamin (MULTIVITAMIN) capsule Take 1 capsule by mouth daily.      No current facility-administered medications for this visit.    Allergies  Allergen  Reactions   Codeine Other (See Comments)    Other reaction(s): Other (See Comments) Pounding in head  Pounding in head    Amoxicillin Other (See Comments)    Diarrhea and vomiting.  Has patient had a PCN reaction causing immediate rash, facial/tongue/throat swelling, SOB or lightheadedness with hypotension: No Has patient had a PCN reaction causing severe rash involving mucus membranes or skin necrosis: No Has patient had a PCN reaction that required hospitalization No Has patient had a PCN reaction occurring within the last 10 years: No If all of the above answers are "NO", then may proceed with Cephalosporin use.    Amoxicillin-Pot Clavulanate Other (See Comments)    Diarrhea and vomiting.    Bee Venom     Swelling   Elemental Sulfur    Latex Hives and Other (See Comments)   Metoprolol Other (See Comments)    Hair loss   Milk-Related Compounds     Diarrhea & gas   Shellfish Allergy     HIVES, SWELLING, N/V & DIARREHA   Sulfa Antibiotics    Sulfamethoxazole Other (See Comments)    Social History   Socioeconomic History   Marital status: Single    Spouse name: Not on file   Number of children: 0   Years of education: 16   Highest education level: Not on file  Occupational History   Occupation: Retired Chartered loss adjuster  Tobacco Use   Smoking status: Never    Passive exposure: Never   Smokeless tobacco: Never  Vaping Use   Vaping status: Never Used  Substance and Sexual Activity   Alcohol use: No    Alcohol/week: 0.0 standard drinks of alcohol   Drug use: No   Sexual activity: Not Currently  Other Topics Concern   Not on file  Social History Narrative   Right handed   Caffeine use: once per week or less, drinks mostly water   Single woman, who lives alone. 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   Social Determinants of Health   Financial Resource Strain: Not on file  Food Insecurity: Not on file  Transportation Needs: Not on file  Physical Activity: Not on file  Stress: Not on file  Social Connections: Unknown (10/07/2022)   Received from [facility], [facility]   Social Network    Social Network: Not on file  Intimate Partner Violence: Unknown (10/07/2022)   Received from [facility], [facility]   HITS    Physically Hurt: Not on file    Insult or Talk Down To: Not on file  Threaten Physical Harm: Not on file    Scream or Curse: Not on file    Family History  Problem Relation Age of Onset   Hypertension Mother    Asthma Mother    Anemia Mother    Hypertension Father    Stroke Father    Hypertension Sister    Atrial fibrillation Brother    Stroke Brother    Hypertension Brother    Stroke Maternal Grandmother    Diabetes Maternal Grandmother    Cancer Maternal Grandfather    Diabetes Paternal Grandmother    Heart attack Paternal Grandfather    Cancer Paternal Grandfather    Diabetes Paternal Grandfather    Hypertension Other    Colon cancer Neg Hx    Esophageal cancer Neg Hx    Inflammatory bowel disease Neg Hx    Liver disease Neg Hx    Pancreatic cancer Neg Hx    Rectal cancer Neg Hx     Review of Systems:  As stated in the HPI and otherwise negative.   LMP 11/16/2009   Physical Examination:  General: Well developed, well nourished, NAD  HEENT: OP clear, mucus membranes moist  SKIN: warm, dry. No rashes. Neuro: No focal deficits  Musculoskeletal: Muscle strength 5/5 all ext  Psychiatric: Mood and affect normal  Neck: No JVD, no carotid bruits, no thyromegaly, no lymphadenopathy.  Lungs:Clear bilaterally, no wheezes, rhonci, crackles Cardiovascular: Regular rate and rhythm. No murmurs, gallops or rubs. Abdomen:Soft. Bowel sounds present. Non-tender.  Extremities: No lower extremity edema. Pulses are 2 + in the  bilateral DP/PT.  Cardiac cath 03/18/13: Left main: No obstructive disease.  Left Anterior Descending Artery: Large caliber vessel that courses to the apex. There are several small caliber diagonal branches. No obstructive disease.  Circumflex Artery: Large caliber vessel with large obtuse marginal branch. No obstructive disease.  Right Coronary Artery: Large dominant vessel with no obstructive disease.  Left Ventricular Angiogram: LVEF=65%.  Impression:  1. No angiographic evidence of CAD  2. Normal LV systolic function  3. Chest pain, cannot fully exclude coronary vasospasm with ST depression on treadmill exercise test.   EKG:  EKG is not ordered today. 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 Coronary CT calcium score of zero in March 2023. No chest pain. Continue Norvasc.   2. HTN: BP is well controlled. Continue Norvasc 2.5 mg daily  3. Lower extremity edema: No LE edema. Lasix prn.   4. Palpitations/PACs: No recent palpitations.   Labs/ tests ordered today include:  No orders of the defined types were placed in this encounter.  Disposition:   F/U with me in 12  months  Signed, Verne Carrow, MD 01/23/2023 6:43 PM    [facility] [address] [place], [place], [place]  [address] Phone: [phone]; Fax: [phone]

## 2023-01-23 NOTE — Telephone Encounter (Signed)
Pre-operative Risk Assessment    Patient Name: Bianca [name]  DOB: 1955/08/20 MRN: [id number]   LAST OFFICE VISIT: 07/27/22 WITH DR. Clifton James   UPCOMING VISIT: 01/24/23 WITH DR. Clifton James   Request for Surgical Clearance    Procedure:  Hemorrhoid surgery   Date of Surgery:  Clearance TBD                                 Surgeon:  Dr. Romie Levee Surgeon's Group or Practice Name:  [facility]  Phone number:  [phone] Fax number:  [phone]   Type of Clearance Requested:   - Medical    Type of Anesthesia:  General    Additional requests/questions:    SignedVernard Gambles   01/23/2023, 4:54 PM

## 2023-01-23 NOTE — H&P (Signed)
PROVIDER:  Elenora Gamma, MD  MRN: [id number] DOB: June 23, 1955 DATE OF ENCOUNTER: 01/23/2023  Subjective  Chief Complaint: RECHECK (HEMORRHOIDS)     History of Present Illness: Bianca [name] is a 67 y.o. female who is seen today as an office consultation at the request of Dr. Seymour Bars for evaluation of RECHECK (HEMORRHOIDS) .   Patient states she has had trouble with her hemorrhoids for many years.  She reports swelling and irritation as well as occasional bleeding.  Colonoscopy was performed last year which showed no other sources for her bleeding.  She reports occasional flares of significant pain.  She reports somewhat irregular bowel habits and sitting on the toilet for long periods of time in the past but she is now correcting these habits.  She denies any straining currently.  She has tried topical ointments and a high-fiber diet over the last few months and noticed no real change in her symptoms.   Review of Systems: A complete review of systems was obtained from the patient.  I have reviewed this information and discussed as appropriate with the patient.  See HPI as well for other ROS.     Medical History: Past Medical History: Diagnosis Date  Anemia   Anxiety   Arthritis   Asthma, unspecified asthma severity, unspecified whether complicated, unspecified whether persistent (HHS-HCC)   GERD (gastroesophageal reflux disease)   Hypertension   Thyroid disease    There is no problem list on file for this patient.   Past Surgical History: Procedure Laterality Date  EGD N/A 09/30/2022  Procedure: EGD - Upper Endoscopy;  Surgeon: Sherald Hess, MD;  [facility];  Service: Gastroenterology;  Laterality: N/A;  ESOPHAGOGASTRODOUDENOSCOPY W/BIOPSY N/A 09/30/2022  Procedure: ESOPHAGOGASTRODUODENOSCOPY, FLEXIBLE, TRANSORAL; WITH BIOPSY, SINGLE OR MULTIPLE;  Surgeon: Sherald Hess, MD;  [facility];  Service: Gastroenterology;  Laterality: N/A;    Allergies Allergen Reactions  Amoxicillin Vomiting  Augmentin [Amoxicillin-Pot Clavulanate] Vomiting  Codeine Headache   Current Outpatient Medications on File Prior to Visit Medication Sig Dispense Refill  amLODIPine (NORVASC) 5 MG tablet Take 5 mg by mouth once daily    famotidine (PEPCID) 20 MG tablet Take 20 mg by mouth 2 (two) times daily    levocetirizine (XYZAL) 5 MG tablet Xyzal    FLUoxetine (PROZAC) 10 MG capsule Take 10 mg by mouth once daily (Patient not taking: Reported on 12/23/2022)    FLUoxetine (PROZAC) 20 MG capsule Take 1 capsule by mouth once daily (Patient not taking: Reported on 12/23/2022)    No current facility-administered medications on file prior to visit.   Family History Problem Relation Age of Onset  High blood pressure (Hypertension) Mother   Hyperlipidemia (Elevated cholesterol) Sister   High blood pressure (Hypertension) Sister   Colon cancer Paternal Uncle     Social History  Tobacco Use Smoking Status Never  Passive exposure: Never Smokeless Tobacco Never    Social History  Socioeconomic History  Marital status: Single Tobacco Use  Smoking status: Never   Passive exposure: Never  Smokeless tobacco: Never Vaping Use  Vaping status: Never Used Substance and Sexual Activity  Alcohol use: Never  Drug use: Never  Social Determinants of Health   Received from [employer]  Social Network   Objective:   Vitals:  01/23/23 1436 PainSc:   5    Exam Gen: NAD Abd: soft Rectal: Significant skin tablets with prolapsing right anterior hemorrhoid noted   Labs, Imaging and  Diagnostic Testing:  Procedure: Anoscopy Surgeon: Maisie Fus After the risks and benefits were explained, written consent was obtained for above procedure.  A medical assistant chaperone was present thoroughout the entire procedure.  Anesthesia: none Diagnosis:  Findings: grade 3 RA,  grade 2 RP, grade 1 LL   Assessment and Plan: There are no diagnoses linked to this encounter.   67 year old female with grade 3 internal hemorrhoids as well as symptomatic external hemorrhoids.  We discussed that 2 column hemorrhoidectomy is the only surgical approach that would alleviate her symptoms.  We discussed the significant pain associated with this and other options that might be available.  She has tried all medical and nonsurgical options and has not enjoyed any significant relief.  We discussed proceeding with hemorrhoidectomy.  All questions were answered.  We discussed significant postoperative pain and possible bleeding.  We discussed approximately 3 to 6 weeks of recovery.  We discussed the need for additional help after the surgery.  All questions were answered.   No follow-ups on file.   Vanita Panda, MD Colon and Rectal Surgery [facility]

## 2023-01-24 ENCOUNTER — Ambulatory Visit: Payer: Medicare PPO | Attending: Cardiovascular Disease | Admitting: Cardiovascular Disease

## 2023-01-24 ENCOUNTER — Encounter: Payer: Self-pay | Admitting: Cardiovascular Disease

## 2023-01-24 VITALS — BP 110/80 | HR 60 | Ht 61.0 in | Wt 146.0 lb

## 2023-01-24 DIAGNOSIS — Z01810 Encounter for preprocedural cardiovascular examination: Secondary | ICD-10-CM | POA: Diagnosis not present

## 2023-01-24 DIAGNOSIS — I10 Essential (primary) hypertension: Secondary | ICD-10-CM | POA: Diagnosis not present

## 2023-01-24 DIAGNOSIS — R002 Palpitations: Secondary | ICD-10-CM

## 2023-01-24 MED ORDER — AMLODIPINE BESYLATE 2.5 MG PO TABS: 2.5000 mg | ORAL_TABLET | Freq: Two times a day (BID) | ORAL | 3 refills | Status: DC

## 2023-01-24 NOTE — Patient Instructions (Signed)

## 2023-01-24 NOTE — Telephone Encounter (Signed)
Name: Bianca [name]  DOB: 26-Feb-1956  MRN: [id number]  Primary Cardiologist: Verne Carrow, MD   Preoperative team, please contact this patient and set up a phone call appointment for further preoperative risk assessment. Please obtain consent and complete medication review. Thank you for your help.Last OV 07/29/2022 with Eligha Bridegroom NP  I confirm that guidance regarding antiplatelet and oral anticoagulation therapy has been completed and, if necessary, noted below.  Not on anticoagulation or antiplatelet therapy.  I also confirmed the patient resides in the state of [place]. As per [facility] Medical Board telemedicine laws, the patient must reside in the state in which the provider is licensed.   Joni Reining, NP 01/24/2023, 8:42 AM [facility]

## 2023-01-26 DIAGNOSIS — Z23 Encounter for immunization: Secondary | ICD-10-CM | POA: Diagnosis not present

## 2023-01-30 ENCOUNTER — Telehealth: Payer: Self-pay | Admitting: Allergy and Immunology

## 2023-01-30 DIAGNOSIS — F4322 Adjustment disorder with anxiety: Secondary | ICD-10-CM | POA: Diagnosis not present

## 2023-01-30 MED ORDER — ALBUTEROL SULFATE HFA 108 (90 BASE) MCG/ACT IN AERS: 1.0000 | INHALATION_SPRAY | Freq: Four times a day (QID) | RESPIRATORY_TRACT | 2 refills | Status: DC | PRN

## 2023-01-30 NOTE — Telephone Encounter (Signed)
Pt request a albuterol refill sent to [facility]- [address]. Pt request a call when refill has been sent in.

## 2023-01-30 NOTE — Telephone Encounter (Signed)
Albuterol has been sent into the [facility] on [address] in [place]. Patient also asked about a Symbicort refill. I informed per Dr.Kozlow's last AVS she is to use albuterol prn and not Symbicort.

## 2023-02-03 DIAGNOSIS — K259 Gastric ulcer, unspecified as acute or chronic, without hemorrhage or perforation: Secondary | ICD-10-CM | POA: Diagnosis not present

## 2023-02-03 DIAGNOSIS — Z8719 Personal history of other diseases of the digestive system: Secondary | ICD-10-CM | POA: Diagnosis not present

## 2023-02-03 DIAGNOSIS — R1010 Upper abdominal pain, unspecified: Secondary | ICD-10-CM | POA: Diagnosis not present

## 2023-02-07 ENCOUNTER — Ambulatory Visit: Payer: Medicare PPO | Admitting: Neurology

## 2023-02-07 DIAGNOSIS — F4322 Adjustment disorder with anxiety: Secondary | ICD-10-CM | POA: Diagnosis not present

## 2023-02-09 DIAGNOSIS — F4322 Adjustment disorder with anxiety: Secondary | ICD-10-CM | POA: Diagnosis not present

## 2023-03-02 ENCOUNTER — Other Ambulatory Visit: Payer: Self-pay | Admitting: Internal Medicine

## 2023-03-02 DIAGNOSIS — R634 Abnormal weight loss: Secondary | ICD-10-CM | POA: Diagnosis not present

## 2023-03-02 DIAGNOSIS — E049 Nontoxic goiter, unspecified: Secondary | ICD-10-CM

## 2023-03-03 DIAGNOSIS — E042 Nontoxic multinodular goiter: Secondary | ICD-10-CM | POA: Diagnosis not present

## 2023-03-03 DIAGNOSIS — E049 Nontoxic goiter, unspecified: Secondary | ICD-10-CM | POA: Diagnosis not present

## 2023-03-06 ENCOUNTER — Ambulatory Visit: Payer: Medicare PPO | Admitting: Physician Assistant

## 2023-03-06 DIAGNOSIS — F4322 Adjustment disorder with anxiety: Secondary | ICD-10-CM | POA: Diagnosis not present

## 2023-03-07 ENCOUNTER — Encounter: Payer: Self-pay | Admitting: Allergy and Immunology

## 2023-03-20 ENCOUNTER — Ambulatory Visit: Payer: Medicare PPO | Admitting: Psychiatry

## 2023-03-21 DIAGNOSIS — F4322 Adjustment disorder with anxiety: Secondary | ICD-10-CM | POA: Diagnosis not present

## 2023-04-01 ENCOUNTER — Ambulatory Visit (INDEPENDENT_AMBULATORY_CARE_PROVIDER_SITE_OTHER): Payer: Medicare PPO

## 2023-04-01 ENCOUNTER — Ambulatory Visit
Admission: RE | Admit: 2023-04-01 | Discharge: 2023-04-01 | Disposition: A | Discharge status: Home / Self Care | Payer: Medicare PPO | Source: Ambulatory Visit | Attending: Internal Medicine

## 2023-04-01 VITALS — BP 127/85 | HR 65 | Temp 98.5°F | Resp 17

## 2023-04-01 DIAGNOSIS — R0789 Other chest pain: Secondary | ICD-10-CM | POA: Diagnosis not present

## 2023-04-01 DIAGNOSIS — R0602 Shortness of breath: Secondary | ICD-10-CM | POA: Insufficient documentation

## 2023-04-01 NOTE — ED Triage Notes (Signed)
Pt presents with c/o sob and chest pressure since last night. States this morning she had diarrhea and intermittent stomach pain. She took Pepcid and [employer]

## 2023-04-01 NOTE — ED Provider Notes (Signed)
[facility]    CSN: [id number] Arrival date & time: 04/01/23  1128      History   Chief Complaint Chief Complaint  Patient presents with   Shortness of Breath   Diarrhea    HPI Bianca [name] is a 67 y.o. female with a past medical history of anxiety, asthma, hypertension, SVT who presents with 1 day of shortness of breath and chest pressure.  Patient reports last night she would have some shortness of breath with exertion as well as with laying flat.  Also states this morning she developed midsternal chest pressure that does not radiate.  She cannot identify if chest pressure and shortness of breath or associated.  She endorses some diarrhea and some sinus headache but denies cough, congestion, fevers, chills, body aches.  She does have a history of asthma and used her inhaler without improvement.  No smoking history.  She took Pepto and Pepcid with no change in symptoms.  Does states she has history of stomach ulcers.  She has been seen by cardiology in the past with echo in July 2024 showed EF of 60 to 65% with no valve disease.  She is also had 2 negative heart caths and 2012 and 2014.  No other concerns at this time.   Shortness of Breath Associated symptoms: chest pain and headaches   Diarrhea Associated symptoms: headaches     Past Medical History:  Diagnosis Date   Allergy    Anemia    Anxiety    Asthma    Eczema    H/O exercise stress test 2008; October 2014   was normal in 2008; positive for ischemia with inferior and lateral ST depressions October 2014   Hx of echocardiogram 091/2012   relatively normal as well. No significant valvar lesions. Normal Ef.   Hypertension    Lower extremity edema 05/18/2021   PRN Furosemide   Migraines    Palpitations     Patient Active Problem List   Diagnosis Date Noted   Coronary vasospasm (HCC) 05/18/2021   Premature atrial contractions 05/18/2021   Lower extremity edema 05/18/2021   Hemorrhoids 05/18/2021    Rectal pain 03/23/2021   Change in bowel habits 03/23/2021   RLQ abdominal pain 03/23/2021   Right flank pain 03/23/2021   Acute midline thoracic back pain 03/23/2021   Chest pain 03/13/2013   Abnormal stress ECG with treadmill 02/24/2013   HTN (hypertension) 12/24/2011   Heart palpitations 12/24/2011   Awareness of heartbeats 11/30/2011    Past Surgical History:  Procedure Laterality Date   ABDOMINAL EXPLORATION SURGERY  1980   CARDIAC CATHETERIZATION  04/2010   with no evidence of ischemia or significant coronary disease to speak of, normal LV function with relatively normal EDP.   LEFT HEART CATHETERIZATION WITH CORONARY ANGIOGRAM N/A 03/18/2013   Procedure: LEFT HEART CATHETERIZATION WITH CORONARY ANGIOGRAM;  Surgeon: Kathleene Hazel, MD;  Location: [facility] CATH [facility];  Service: Cardiovascular;  Laterality: N/A;   NASAL SINUS SURGERY      OB History   No obstetric history on file.      Home Medications    Prior to Admission medications   Medication Sig Start Date End Date Taking? Authorizing Provider  acetaminophen (TYLENOL) 325 MG tablet Take 500 mg by mouth every 6 (six) hours as needed for mild pain.     [provider]  albuterol (VENTOLIN HFA) 108 (90 Base) MCG/ACT inhaler Inhale 1-2 puffs into the lungs every 6 (six) hours as  needed for wheezing or shortness of breath. 01/30/23   Kozlow, Alvira Philips, MD  amLODipine (NORVASC) 2.5 MG tablet Take 1 tablet (2.5 mg total) by mouth 2 (two) times daily. 01/24/23   Kathleene Hazel, MD  ascorbic acid (VITAMIN C) 500 MG tablet Take 500 mg by mouth daily.    [provider]  cholecalciferol (VITAMIN D3) 25 MCG (1000 UNIT) tablet Take 1,000 Units by mouth daily.    [provider]  EPINEPHrine 0.3 mg/0.3 mL IJ SOAJ injection Inject 0.3 mg into the muscle as needed for anaphylaxis. 11/08/22   Alfonse Spruce, MD  famotidine (PEPCID) 20 MG tablet Take 20 mg by mouth daily. 12/29/22   [provider]  fluticasone (FLONASE) 50 MCG/ACT nasal spray Place 1 spray into both nostrils as needed for allergies.    [provider]  guaiFENesin (MUCINEX) 600 MG 12 hr tablet Take by mouth as needed.     [provider]  hydrocortisone-pramoxine [facility]) rectal foam Place 1 applicator rectally 2 (two) times daily. 03/23/21   Mansouraty, Netty Starring., MD  ipratropium-albuterol (DUONEB) 0.5-2.5 (3) MG/3ML SOLN Take 3 mLs by nebulization every 4 (four) hours as needed. 12/27/18   Kozlow, Alvira Philips, MD  levocetirizine (XYZAL) 5 MG tablet Take 1 tablet (5 mg total) by mouth every evening. 10/17/22   Alfonse Spruce, MD  lidocaine (XYLOCAINE) 5 % ointment Apply 1 Application topically daily as needed. 12/29/22 12/29/23  [provider]  Multiple Vitamin (MULTIVITAMIN) capsule Take 1 capsule by mouth daily.     [provider]    Family History Family History  Problem Relation Age of Onset   Hypertension Mother    Asthma Mother    Anemia Mother    Hypertension Father    Stroke Father    Hypertension Sister    Atrial fibrillation Brother    Stroke Brother    Hypertension Brother    Stroke Maternal Grandmother    Diabetes Maternal Grandmother    Cancer Maternal Grandfather    Diabetes Paternal Grandmother    Heart attack Paternal Grandfather    Cancer Paternal Grandfather    Diabetes Paternal Grandfather    Hypertension Other    Colon cancer Neg Hx    Esophageal cancer Neg Hx    Inflammatory bowel disease Neg Hx    Liver disease Neg Hx    Pancreatic cancer Neg Hx    Rectal cancer Neg Hx     Social History Social History   Tobacco Use   Smoking status: Never    Passive exposure: Never   Smokeless tobacco: Never  Vaping Use   Vaping status: Never Used  Substance Use Topics   Alcohol use: No    Alcohol/week: 0.0 standard drinks of alcohol   Drug use: No     Allergies   Codeine, Amoxicillin, Amoxicillin-pot clavulanate, Bee venom,  Elemental sulfur, Latex, Metoprolol, Milk-related compounds, Shellfish allergy, Sulfa antibiotics, and Sulfamethoxazole   Review of Systems Review of Systems  Respiratory:  Positive for shortness of breath.   Cardiovascular:  Positive for chest pain.  Gastrointestinal:  Positive for diarrhea.  Neurological:  Positive for headaches.     Physical Exam Triage Vital Signs ED Triage Vitals  Encounter Vitals Group     BP 04/01/23 1205 127/85     Systolic BP Percentile --      Diastolic BP Percentile --      Pulse Rate 04/01/23 1205 65     Resp 04/01/23  1205 17     Temp 04/01/23 1205 98.5 F (36.9 C)     Temp Source 04/01/23 1205 Oral     SpO2 04/01/23 1205 98 %     Weight --      Height --      Head Circumference --      Peak Flow --      Pain Score 04/01/23 1204 6     Pain Loc --      Pain Education --      Exclude from Growth Chart --    No data found.  Updated Vital Signs BP 127/85 (BP Location: Right Arm)   Pulse 65   Temp 98.5 F (36.9 C) (Oral)   Resp 17   LMP 11/16/2009   SpO2 98%   Visual Acuity Right Eye Distance:   Left Eye Distance:   Bilateral Distance:    Right Eye Near:   Left Eye Near:    Bilateral Near:     Physical Exam Vitals and nursing note reviewed.  Constitutional:      General: She is not in acute distress.    Appearance: She is well-developed. She is not ill-appearing.  HENT:     Head: Normocephalic and atraumatic.     Right Ear: Tympanic membrane and ear canal normal.     Left Ear: Tympanic membrane and ear canal normal.     Nose: No congestion or rhinorrhea.     Mouth/Throat:     Mouth: Mucous membranes are moist.     Pharynx: Oropharynx is clear. Uvula midline. No posterior oropharyngeal erythema.     Tonsils: No tonsillar exudate or tonsillar abscesses.  Eyes:     Conjunctiva/sclera: Conjunctivae normal.     Pupils: Pupils are equal, round, and reactive to light.  Cardiovascular:     Rate and Rhythm: Normal rate and regular  rhythm.     Heart sounds: Normal heart sounds.  Pulmonary:     Effort: Pulmonary effort is normal.     Breath sounds: Normal breath sounds.  Chest:     Chest wall: No tenderness.  Musculoskeletal:     Cervical back: Normal range of motion and neck supple.  Lymphadenopathy:     Cervical: No cervical adenopathy.  Skin:    General: Skin is warm and dry.  Neurological:     General: No focal deficit present.     Mental Status: She is alert and oriented to person, place, and time.  Psychiatric:        Mood and Affect: Mood normal.        Behavior: Behavior normal.      UC Treatments / Results  Labs (all labs ordered are listed, but only abnormal results are displayed) Labs Reviewed  SARS CORONAVIRUS 2 (TAT 6-24 HRS)    EKG   Radiology DG Chest 2 View Result Date: 04/01/2023 CLINICAL DATA:  Shortness of breath EXAM: CHEST - 2 VIEW COMPARISON:  01/19/2022 FINDINGS: The heart size and mediastinal contours are within normal limits. Both lungs are clear. The visualized skeletal structures are unremarkable. IMPRESSION: No active cardiopulmonary disease. Electronically Signed   By: Duanne Guess D.O.   On: 04/01/2023 12:59    Procedures ED EKG  Date/Time: 04/01/2023 12:36 PM  Performed by: Radford Pax, NP Authorized by: Radford Pax, NP   ECG interpreted by ED Physician in the absence of a cardiologist: no   Interpretation:    Interpretation: normal   Rate:    ECG  rate:  61   ECG rate assessment: normal   Rhythm:    Rhythm: sinus rhythm   QRS:    QRS axis:  Normal ST segments:    ST segments:  Normal T waves:    T waves: normal   Q waves:    Abnormal Q-waves: not present    (including critical care time)  Medications Ordered in UC Medications - No data to display  Initial Impression / Assessment and Plan / UC Course  I have reviewed the triage vital signs and the nursing notes.  Pertinent labs & imaging results that were available during my care of the  patient were reviewed by me and considered in my medical decision making (see chart for details).     Reviewed exam and symptoms with patient.  Discussed limitations and abilities of urgent care.  Declined flu testing, COVID PCR sent will contact for any positive results.  Chest x-ray negative and EKG without any acute changes.  Unclear cause of patient's shortness of breath with exertion, chest pressure.  She has no cough or congestion or fever.  Advised him unable to rule out any more urgent causes of her symptoms including PE, CAD, etc.  I advised that she go to the ER for further workup of her symptoms.  She declined at this time stating that she will go home and monitor symptoms and if they worsen she will go to the ER.  I did stress the importance of her getting reevaluated as soon as possible to determine any life-threatening causes of her symptoms.  Again she states she will monitor symptoms at home and will go if they worsen.  She was discharged in stable condition in no acute distress. Final Clinical Impressions(s) / UC Diagnoses   Final diagnoses:  Shortness of breath  Chest pressure   Discharge Instructions   None    ED Prescriptions   None    PDMP not reviewed this encounter.   Radford Pax, NP 04/01/23 [phone]

## 2023-04-01 NOTE — Discharge Instructions (Signed)
My medical recommendation is that you go to the ER for further workup of your symptoms

## 2023-04-02 LAB — SARS CORONAVIRUS 2 (TAT 6-24 HRS): SARS Coronavirus 2: NEGATIVE

## 2023-04-03 DIAGNOSIS — K642 Third degree hemorrhoids: Secondary | ICD-10-CM | POA: Diagnosis not present

## 2023-04-18 DIAGNOSIS — F4322 Adjustment disorder with anxiety: Secondary | ICD-10-CM | POA: Diagnosis not present

## 2023-04-25 DIAGNOSIS — F4322 Adjustment disorder with anxiety: Secondary | ICD-10-CM | POA: Diagnosis not present

## 2023-04-25 DIAGNOSIS — F411 Generalized anxiety disorder: Secondary | ICD-10-CM | POA: Diagnosis not present

## 2023-05-01 DIAGNOSIS — F4322 Adjustment disorder with anxiety: Secondary | ICD-10-CM | POA: Diagnosis not present

## 2023-05-01 DIAGNOSIS — F411 Generalized anxiety disorder: Secondary | ICD-10-CM | POA: Diagnosis not present

## 2023-05-11 ENCOUNTER — Encounter: Payer: Self-pay | Admitting: Allergy & Immunology

## 2023-05-11 ENCOUNTER — Ambulatory Visit: Payer: Medicare PPO | Admitting: Allergy & Immunology

## 2023-05-11 ENCOUNTER — Other Ambulatory Visit: Payer: Self-pay

## 2023-05-11 VITALS — BP 110/60 | HR 64 | Temp 98.3°F | Resp 20 | Ht 61.42 in | Wt 147.2 lb

## 2023-05-11 DIAGNOSIS — J4520 Mild intermittent asthma, uncomplicated: Secondary | ICD-10-CM | POA: Diagnosis not present

## 2023-05-11 DIAGNOSIS — K2100 Gastro-esophageal reflux disease with esophagitis, without bleeding: Secondary | ICD-10-CM

## 2023-05-11 DIAGNOSIS — T782XXD Anaphylactic shock, unspecified, subsequent encounter: Secondary | ICD-10-CM

## 2023-05-11 DIAGNOSIS — T7840XD Allergy, unspecified, subsequent encounter: Secondary | ICD-10-CM | POA: Diagnosis not present

## 2023-05-11 DIAGNOSIS — F411 Generalized anxiety disorder: Secondary | ICD-10-CM | POA: Diagnosis not present

## 2023-05-11 NOTE — Patient Instructions (Addendum)
1. Mild persistent asthma, uncomplicated - Lung testing not done today. - I think that you are doing well from a breathing perspective.  - Daily controller medication(s): NOTHING  - Prior to physical activity: albuterol 2 puffs 10-15 minutes before physical activity. - Rescue medications: DuoNeb nebulizer one vial every 4-6 hours as needed - Changes during respiratory infections or worsening symptoms: Add on Symbicort 1-2 puffs twice daily for TWO WEEKS. - Asthma control goals:  * Full participation in all desired activities (may need albuterol before activity) * Albuterol use two time or less a week on average (not counting use with activity) * Cough interfering with sleep two time or less a month * Oral steroids no more than once a year * No hospitalizations  2. Perennial allergic rhinitis - Continue with as needed nose sprays.   3. Concern for food sensitivities - I think you might benefit from seeing the integrative folks at: http://gray.org/ - You can call [phone] to make an appointment or talk to them about anticipated costs.  - I am not sure about whether they accept insurances or not.  - You can also look into [facility] testing which you can purchase online (notably Amazon and other places).  - This testing is not validated, so we do not do it at all.  - But there are some patients that find it helpful.   4. Return in about 1 year (around 05/10/2024). You can have the follow up appointment with Dr. Dellis Anes or a Nurse Practicioner (our Nurse Practitioners are excellent and always have Physician oversight!).    Please inform us of any Emergency Department visits, hospitalizations, or changes in symptoms. Call us before going to the ED for breathing or allergy symptoms since we might be able to fit you in for a sick visit. Feel free to contact us anytime with any questions, problems, or concerns.  It was a pleasure to see you again today!  Websites  that have reliable patient information: 1. American Academy of Asthma, Allergy, and Immunology: www.aaaai.org 2. Food Allergy Research and Education (FARE): foodallergy.org 3. Mothers of Asthmatics: http://www.asthmacommunitynetwork.org 4. American College of Allergy, Asthma, and Immunology: www.acaai.org      "Like" [place] on Facebook and Instagram for our latest updates!      A healthy democracy works best when [employer] participate! Make sure you are registered to vote! If you have moved or changed any of your contact information, you will need to get this updated before voting! Scan the QR codes below to learn more!      Food Intolerance Versus Food Allergy  Food IntoleranceSome of the symptoms of food intolerance and food allergy are similar, but the differences between the two are very important. Eating a food you are intolerant to can leave you feeling miserable. However, if you have a true food allergy, your body's reaction to this food could be life-threatening.  Digestive system versus immune system  A food intolerance response takes place in the digestive system. It occurs when you are unable to properly breakdown the food. This could be due to enzyme deficiencies, sensitivity to food additives or reactions to naturally occurring chemicals in foods. Often, people can eat small amounts of the food without causing problems.  A food allergic reaction involves the immune system. Your immune system controls how your body defends itself. For instance, if you have an allergy to cow's milk, your immune system identifies cow's milk as an invader or allergen. Your immune system overreacts by  producing antibodies called Immunoglobulin E (IgE). These antibodies travel to cells that release chemicals, causing an allergic reaction. Each type of IgE has a specific "radar" for each type of allergen.  Unlike an intolerance to food, a food allergy can cause a serious or even life-threatening  reaction by eating a microscopic amount, touching or inhaling the food.  Symptoms of allergic reactions to foods are generally seen on the skin (hives, itchiness, swelling of the skin). Gastrointestinal symptoms may include vomiting and diarrhea. Respiratory symptoms may accompany skin and gastrointestinal symptoms, but don't usually occur alone.  Anaphylaxis (pronounced an-a-fi-LAK-sis) is a serious allergic reaction that happens very quickly. Symptoms of anaphylaxis may include difficulty breathing, dizziness or loss of consciousness. Without immediate treatment--an injection of epinephrine (adrenalin) and expert care--anaphylaxis can be fatal.  To the Point: there is a very serious difference between being intolerant to a food and having a food allergy.     Regarding Food IgG Testing: In IgG testing, the blood is tested for IgG antibodies instead of being tested for IgE antibodies (i.e., the antibodies typically associated with food allergies). The existence of serum IgG antibodies towards particular foods is claimed by many practitioners as a tool to diagnose food allergy or intolerance. The problem with this is that IgG is a "memory antibody." IgG signifies exposure to a food, not allergy to a food. Since a normal immune system should make IgG antibodies to foreign proteins, a positive IgG test to a food is a sign of a normal immune system. In fact, a positive result can actually indicate tolerance for the food, not intolerance. There is no scientific evidence to support IgG testing for the diagnosis of food allergies.

## 2023-05-11 NOTE — Progress Notes (Signed)
FOLLOW UP  Date of Service/Encounter:  05/12/23   Assessment:   Allergic reactions to multiple medications - mast cell work up negative (confirmed with a referral to [facility])    Food allergies (shellfish, wheat, rice, red meats, avocado, potato) - negative testing    Preference for holistic treatment options - recommended [facility]   Complex past medical history including sensorineural hearing loss, hypertension, coronary heart disease, and abdominal pain     Alaney presents for a follow-up visit.  She continues to adverse food reactions, which I do not think are allergic in nature at all.  She wants to explore the sensitivity testing, so I gave her some options.  I explained the limitations of traditional food sensitivity testing, including the lack of control of trials showing that it provides any help at all.  I recommended that she see integrative medicine practice and provided a number for a practice in the [place] area.  I still think Xolair to be helpful for her hives, but as with all of her other medical issues, she really does not want to pursue anything aggressive.    Plan/Recommendations:   1. Mild persistent asthma, uncomplicated - Lung testing not done today. - I think that you are doing well from a breathing perspective.  - Daily controller medication(s): NOTHING  - Prior to physical activity: albuterol 2 puffs 10-15 minutes before physical activity. - Rescue medications: DuoNeb nebulizer one vial every 4-6 hours as needed - Changes during respiratory infections or worsening symptoms: Add on Symbicort 1-2 puffs twice daily for TWO WEEKS. - Asthma control goals:  * Full participation in all desired activities (may need albuterol before activity) * Albuterol use two time or less a week on average (not counting use with activity) * Cough interfering with sleep two time or less a month * Oral steroids no more than once a year * No  hospitalizations  2. Perennial allergic rhinitis - Continue with as needed nose sprays.   3. Concern for food sensitivities - I think you might benefit from seeing the integrative folks at: [facility]://[facility]/ - You can call [phone] to make an appointment or talk to them about anticipated costs.  - I am not sure about whether they accept insurances or not.  - You can also look into [facility] testing which you can purchase online (notably [employer] and other places). - This testing is not validated, so we do not do it at all.  - But there are some patients that find it helpful.   4. Return in about 1 year (around 05/10/2024). You can have the follow up appointment with Dr. Dellis Anes or a Nurse Practicioner (our Nurse Practitioners are excellent and always have Physician oversight!).   Subjective:   SIRI BUEGE is a 68 y.o. female presenting today for follow up of  Chief Complaint  Patient presents with   Follow-up    YOUNIQUE CASAD has a history of the following: Patient Active Problem List   Diagnosis Date Noted   Coronary vasospasm (HCC) 05/18/2021   Premature atrial contractions 05/18/2021   Lower extremity edema 05/18/2021   Hemorrhoids 05/18/2021   Rectal pain 03/23/2021   Change in bowel habits 03/23/2021   RLQ abdominal pain 03/23/2021   Right flank pain 03/23/2021   Acute midline thoracic back pain 03/23/2021   Chest pain 03/13/2013   Abnormal stress ECG with treadmill 02/24/2013   HTN (hypertension) 12/24/2011   Heart palpitations 12/24/2011   Awareness of heartbeats 11/30/2011  History obtained from: chart review and patient.  Discussed the use of AI scribe software for clinical note transcription with the patient and/or guardian, who gave verbal consent to proceed.  Luke is a 68 y.o. female presenting for a follow up visit.  She was last seen in September 2024 by Dr. Lucie Leather.  At that time, she went through a severe episode of  gastritis and esophagitis.  Changes to her diet were discussed including decreasing foods that might make reflux worse.  Since the last visit, she has been about the same. She reports feeling better overall since her last visit, with no new symptoms or changes in her health status. However, she continues to experience occasional lightheadedness, the cause of which remains unclear.  She did see [facility] in September 2024 for a second opinion.  They essentially said the same thing as we did.  They did not feel that her food reactions were consistent with an IgE mediated reaction.  They agreed with initiation of Xolair.  Again, she was not interested in it.  Allergic Rhinitis Symptom History: She has been managing her allergies by avoiding known allergens and using Flonase for nasal congestion.  Food Allergy Symptom History: She reports that certain foods, including nuts, broccoli, and collard greens, sometimes cause gastrointestinal discomfort or lightheadedness.  But her reactions are not consistent at all. She also reports adverse reactions to multivitamins, specifically Centrum and an over-the-counter [employer] brand. She has tried looking at the ingredients to compare the differences.  She describes experiencing sharp pains in her body and an overall feeling of unwellness after taking these multivitamins. She speculates that she might be allergic to zinc, an ingredient in the multivitamins, as her father had a zinc allergy.  I explained to her that these vitamins minerals are not large enough to cause a true allergic reaction and be recognized by the immune system, therefore we have no testing for this.  We have discussed Xolair in the past, but she has not been interested in proceeding with this.  GERD Symptom History: She has been taking Pepcid daily for her acid reflux and reports that it helps manage her symptoms. However, she expresses a desire to understand the root cause of her acid  reflux and food sensitivities.  Of note, she is followed by Dr. Romie Levee and manages her hemorrhoids.  Her last visit was in December 2024.  At that time, she was reporting increased rectal pain.  She again recommended performing surgery to correct it, but she was not interested in the surgery again.  Sitz bath's were recommended.  Ravneet has a history of allergies to several antibiotics, including amoxicillin and sulfa, which cause itching. She also reports a bad headache after taking doxycycline.  She really is not interested in any challenges to address these antibiotic allergy labels.  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 She has no immediate travel plans.   Otherwise, there have been no changes to her past medical history, surgical history, family history, or social history.    Review of systems otherwise negative other than that mentioned in the HPI.    Objective:   Blood pressure 110/60, pulse 64, temperature 98.3 F (36.8 C), temperature source Temporal, resp. rate 20, height 5' 1.42" (1.56 m), weight 147 lb 3.2 oz (66.8 kg), last menstrual period 11/16/2009, SpO2 100%. Body mass index is 27.44 kg/m.    Physical Exam Vitals reviewed.  Constitutional:  Appearance: She is well-developed.     Comments: Pleasant. Talkative.   HENT:     Head: Normocephalic and atraumatic.     Right Ear: Tympanic membrane, ear canal and external ear normal. No drainage, swelling or tenderness. Tympanic membrane is not injected, scarred, erythematous, retracted or bulging.     Left Ear: Tympanic membrane, ear canal and external ear normal. No drainage, swelling or tenderness. Tympanic membrane is not injected, scarred, erythematous, retracted or bulging.     Nose: No nasal deformity, septal deviation, mucosal edema or rhinorrhea.     Right Turbinates: Not enlarged or swollen.     Left Turbinates: Not enlarged or swollen.      Right Sinus: No maxillary sinus tenderness or frontal sinus tenderness.     Left Sinus: No maxillary sinus tenderness or frontal sinus tenderness.     Mouth/Throat:     Mouth: Mucous membranes are not pale and not dry.     Pharynx: Uvula midline.  Eyes:     General:        Right eye: No discharge.        Left eye: No discharge.     Conjunctiva/sclera: Conjunctivae normal.     Right eye: Right conjunctiva is not injected. No chemosis.    Left eye: Left conjunctiva is not injected. No chemosis.    Pupils: Pupils are equal, round, and reactive to light.  Cardiovascular:     Rate and Rhythm: Normal rate and regular rhythm.     Heart sounds: Normal heart sounds.  Pulmonary:     Effort: Pulmonary effort is normal. No tachypnea, accessory muscle usage or respiratory distress.     Breath sounds: Normal breath sounds. No wheezing, rhonchi or rales.  Chest:     Chest wall: No tenderness.  Abdominal:     Tenderness: There is no abdominal tenderness. There is no guarding or rebound.  Lymphadenopathy:     Head:     Right side of head: No submandibular, tonsillar or occipital adenopathy.     Left side of head: No submandibular, tonsillar or occipital adenopathy.     Cervical: No cervical adenopathy.  Skin:    General: Skin is warm.     Capillary Refill: Capillary refill takes less than 2 seconds.     Coloration: Skin is not pale.     Findings: No abrasion, erythema, petechiae or rash. Rash is not papular, urticarial or vesicular.     Comments: No rashes appreciated.   Neurological:     Mental Status: She is alert.  Psychiatric:        Behavior: Behavior is cooperative.      Diagnostic studies: none       Malachi Bonds, MD  [facility]

## 2023-05-12 ENCOUNTER — Encounter: Payer: Self-pay | Admitting: Allergy & Immunology

## 2023-05-20 ENCOUNTER — Other Ambulatory Visit: Payer: Self-pay

## 2023-05-20 ENCOUNTER — Encounter (HOSPITAL_COMMUNITY): Payer: Self-pay | Admitting: *Deleted

## 2023-05-20 ENCOUNTER — Emergency Department (HOSPITAL_COMMUNITY)
Admission: EM | Admit: 2023-05-20 | Discharge: 2023-05-21 | Disposition: A | Discharge status: Home / Self Care | Payer: Medicare PPO | Attending: Emergency Medicine | Admitting: Emergency Medicine

## 2023-05-20 DIAGNOSIS — Z79899 Other long term (current) drug therapy: Secondary | ICD-10-CM | POA: Insufficient documentation

## 2023-05-20 DIAGNOSIS — R0602 Shortness of breath: Secondary | ICD-10-CM | POA: Insufficient documentation

## 2023-05-20 DIAGNOSIS — R197 Diarrhea, unspecified: Secondary | ICD-10-CM | POA: Diagnosis not present

## 2023-05-20 DIAGNOSIS — B349 Viral infection, unspecified: Secondary | ICD-10-CM | POA: Insufficient documentation

## 2023-05-20 DIAGNOSIS — I201 Angina pectoris with documented spasm: Secondary | ICD-10-CM | POA: Insufficient documentation

## 2023-05-20 DIAGNOSIS — R1032 Left lower quadrant pain: Secondary | ICD-10-CM | POA: Diagnosis not present

## 2023-05-20 DIAGNOSIS — Z20822 Contact with and (suspected) exposure to covid-19: Secondary | ICD-10-CM | POA: Insufficient documentation

## 2023-05-20 DIAGNOSIS — R1030 Lower abdominal pain, unspecified: Secondary | ICD-10-CM | POA: Diagnosis not present

## 2023-05-20 DIAGNOSIS — I10 Essential (primary) hypertension: Secondary | ICD-10-CM | POA: Diagnosis not present

## 2023-05-20 DIAGNOSIS — R109 Unspecified abdominal pain: Secondary | ICD-10-CM | POA: Diagnosis not present

## 2023-05-20 DIAGNOSIS — J45909 Unspecified asthma, uncomplicated: Secondary | ICD-10-CM | POA: Diagnosis not present

## 2023-05-20 DIAGNOSIS — Z91040 Latex allergy status: Secondary | ICD-10-CM | POA: Insufficient documentation

## 2023-05-20 DIAGNOSIS — R0600 Dyspnea, unspecified: Secondary | ICD-10-CM | POA: Diagnosis not present

## 2023-05-20 LAB — COMPREHENSIVE METABOLIC PANEL
ALT: 18 U/L (ref 0–44)
AST: 27 U/L (ref 15–41)
Albumin: 3.8 g/dL (ref 3.5–5.0)
Alkaline Phosphatase: 61 U/L (ref 38–126)
Anion gap: 10 (ref 5–15)
BUN: 24 mg/dL — ABNORMAL HIGH (ref 8–23)
CO2: 23 mmol/L (ref 22–32)
Calcium: 10.2 mg/dL (ref 8.9–10.3)
Chloride: 106 mmol/L (ref 98–111)
Creatinine, Ser: 0.96 mg/dL (ref 0.44–1.00)
GFR, Estimated: >60 mL/min (ref >60)
Glucose, Bld: 97 mg/dL (ref 70–99)
Potassium: 3.6 mmol/L (ref 3.5–5.1)
Sodium: 139 mmol/L (ref 135–145)
Total Bilirubin: 0.8 mg/dL (ref 0.0–1.2)
Total Protein: 7.3 g/dL (ref 6.5–8.1)

## 2023-05-20 LAB — URINALYSIS, ROUTINE W REFLEX MICROSCOPIC
Bacteria, UA: NONE SEEN
Bilirubin Urine: NEGATIVE
Glucose, UA: NEGATIVE mg/dL
Ketones, ur: NEGATIVE mg/dL
Leukocytes,Ua: NEGATIVE
Nitrite: NEGATIVE
Protein, ur: NEGATIVE mg/dL
Specific Gravity, Urine: 1.015 (ref 1.005–1.030)
pH: 5 (ref 5.0–8.0)

## 2023-05-20 LAB — CBC
HCT: 35.8 % — ABNORMAL LOW (ref 36.0–46.0)
Hemoglobin: 12.9 g/dL (ref 12.0–15.0)
MCH: 28.9 pg (ref 26.0–34.0)
MCHC: 36 g/dL (ref 30.0–36.0)
MCV: 80.1 fL (ref 80.0–100.0)
Platelets: 349 10*3/uL (ref 150–400)
RBC: 4.47 MIL/uL (ref 3.87–5.11)
RDW: 14 % (ref 11.5–15.5)
WBC: 5.9 10*3/uL (ref 4.0–10.5)
nRBC: 0 % (ref 0.0–0.2)

## 2023-05-20 LAB — LIPASE, BLOOD: Lipase: 30 U/L (ref 11–51)

## 2023-05-20 NOTE — ED Triage Notes (Signed)
The tpt has had dizziness since this am nausea  vomiting and she has lower abd pain shortness of breath

## 2023-05-21 ENCOUNTER — Emergency Department (HOSPITAL_COMMUNITY): Payer: Medicare PPO

## 2023-05-21 DIAGNOSIS — R0600 Dyspnea, unspecified: Secondary | ICD-10-CM | POA: Diagnosis not present

## 2023-05-21 DIAGNOSIS — R109 Unspecified abdominal pain: Secondary | ICD-10-CM | POA: Diagnosis not present

## 2023-05-21 LAB — RESP PANEL BY RT-PCR (RSV, FLU A&B, COVID)  RVPGX2
Influenza A by PCR: NEGATIVE
Influenza B by PCR: NEGATIVE
Resp Syncytial Virus by PCR: NEGATIVE
SARS Coronavirus 2 by RT PCR: NEGATIVE

## 2023-05-21 LAB — TROPONIN I (HIGH SENSITIVITY): Troponin I (High Sensitivity): 6 ng/L (ref <18)

## 2023-05-21 MED ORDER — ONDANSETRON HCL 4 MG PO TABS
4.0000 mg | ORAL_TABLET | ORAL | 0 refills | Status: DC | PRN
Start: 2023-05-21 — End: 2023-08-24

## 2023-05-21 MED ORDER — KETOROLAC TROMETHAMINE 15 MG/ML IJ SOLN
15.0000 mg | Freq: Once | INTRAMUSCULAR | Status: AC
  Administered 2023-05-21: 15 mg via INTRAVENOUS
  Filled 2023-05-21: qty 1

## 2023-05-21 MED ORDER — IOHEXOL 350 MG/ML SOLN
75.0000 mL | Freq: Once | INTRAVENOUS | Status: AC | PRN
  Administered 2023-05-21: 75 mL via INTRAVENOUS

## 2023-05-21 MED ORDER — DICYCLOMINE HCL 20 MG PO TABS: 20.0000 mg | ORAL_TABLET | Freq: Two times a day (BID) | ORAL | 0 refills | Status: DC

## 2023-05-21 MED ORDER — ONDANSETRON HCL 4 MG/2ML IJ SOLN
4.0000 mg | Freq: Once | INTRAMUSCULAR | Status: AC
  Administered 2023-05-21: 4 mg via INTRAVENOUS
  Filled 2023-05-21: qty 2

## 2023-05-21 NOTE — ED Notes (Signed)
Patient ambulated to bathroom without assistance and steady on feet. Patient states she endorsed minor lightheadedness, however improved from when she initially came in.

## 2023-05-21 NOTE — Discharge Instructions (Addendum)
It was a pleasure caring for you today in the emergency department.  Please return to the emergency department for any worsening or worrisome symptoms.     The etiology of your abdominal pain is unclear.    There are many causes of abdominal pain. Most pain is not serious and goes away, but some pain gets worse, changes, or will not go away. Please return to the emergency department or see your doctor right away if you (or your family member) experience any of the following:  1. Pain that gets worse or moves to just one spot.  2. Pain that gets worse if you cough or sneeze.  3. Pain with going over a bump in the road.  4. Pain that does not get better in 24 hours.  5. Inability to keep down liquids (vomiting)-especially if you are making less urine.  6. Fainting.  7. Blood in the vomit or stool.  8. High fever or shaking chills.  9. Swelling of the abdomen.  10. Any new or worsening problem.      Follow-up Instructions  Return to the emergency department in 24 hours for recheck if worse.   Additional Instructions  No alcohol.  No caffeine, aspirin, or cigarettes.   Please return to the emergency department immediately for any new or concerning symptoms, or if you get worse.

## 2023-05-21 NOTE — ED Provider Notes (Signed)
[place] EMERGENCY DEPARTMENT AT [facility] Provider Note  CSN: [id number] Arrival date & time: 05/20/23 2036  Chief Complaint(s) Abdominal Pain and Dizziness  HPI Bianca [name] is a 68 y.o. female with past medical history as below, significant for anxiety, asthma, hypertension, migraine headaches, coronary vasospasm who presents to the ED with complaint of multi complaints including nausea, vomiting, dyspnea.  She is been feeling unwell since this morning, having some bodyaches, subjective fevers and chills, nonproductive cough, some difficulty breathing.  Lower abdominal pain, nausea, diarrhea.  No vomiting.  She used  her albuterol inhaler a few times which did not provide relief of her symptoms.  No chest pain.  Feels her breathing has improved while in the lobby  Past Medical History Past Medical History:  Diagnosis Date   Allergy    Anemia    Anxiety    Asthma    Eczema    H/O exercise stress test 2008; October 2014   was normal in 2008; positive for ischemia with inferior and lateral ST depressions October 2014   Hx of echocardiogram 091/2012   relatively normal as well. No significant valvar lesions. Normal Ef.   Hypertension    Lower extremity edema 05/18/2021   PRN Furosemide   Migraines    Palpitations    Patient Active Problem List   Diagnosis Date Noted   Coronary vasospasm (HCC) 05/18/2021   Premature atrial contractions 05/18/2021   Lower extremity edema 05/18/2021   Hemorrhoids 05/18/2021   Rectal pain 03/23/2021   Change in bowel habits 03/23/2021   RLQ abdominal pain 03/23/2021   Right flank pain 03/23/2021   Acute midline thoracic back pain 03/23/2021   Chest pain 03/13/2013   Abnormal stress ECG with treadmill 02/24/2013   HTN (hypertension) 12/24/2011   Heart palpitations 12/24/2011   Awareness of heartbeats 11/30/2011   Home Medication(s) Prior to Admission medications   Medication Sig Start Date End Date Taking? Authorizing  Provider  acetaminophen (TYLENOL) 500 MG tablet Take 500 mg by mouth every 6 (six) hours as needed for mild pain (pain score 1-3).   Yes [provider]  albuterol (VENTOLIN HFA) 108 (90 Base) MCG/ACT inhaler Inhale 1-2 puffs into the lungs every 6 (six) hours as needed for wheezing or shortness of breath. 01/30/23  Yes Kozlow, Alvira Philips, MD  amLODipine (NORVASC) 2.5 MG tablet Take 1 tablet (2.5 mg total) by mouth 2 (two) times daily. Patient taking differently: Take 2.5 mg by mouth daily. 01/24/23  Yes Kathleene Hazel, MD  ascorbic acid (VITAMIN C) 500 MG tablet Take 500 mg by mouth daily.   Yes [provider]  cholecalciferol (VITAMIN D3) 25 MCG (1000 UNIT) tablet Take 1,000 Units by mouth daily.   Yes [provider]  dicyclomine (BENTYL) 20 MG tablet Take 1 tablet (20 mg total) by mouth 2 (two) times daily. 05/21/23  Yes Tanda Rockers A, DO  EPINEPHrine 0.3 mg/0.3 mL IJ SOAJ injection Inject 0.3 mg into the muscle as needed for anaphylaxis. 11/08/22  Yes Alfonse Spruce, MD  famotidine (PEPCID) 20 MG tablet Take 20 mg by mouth daily. 12/29/22  Yes [provider]  fluticasone (FLONASE) 50 MCG/ACT nasal spray Place 1 spray into both nostrils as needed for allergies.   Yes [provider]  guaiFENesin (MUCINEX) 600 MG 12 hr tablet Take by mouth as needed.    Yes [provider]  levocetirizine (XYZAL) 5 MG tablet Take 1 tablet (5 mg total) by mouth  every evening. Patient taking differently: Take 5 mg by mouth daily as needed for allergies. 10/17/22  Yes Alfonse Spruce, MD  ondansetron (ZOFRAN) 4 MG tablet Take 1 tablet (4 mg total) by mouth every 4 (four) hours as needed for nausea or vomiting. 05/21/23  Yes Sloan Leiter, DO                                                                                                                                    Past Surgical History Past Surgical History:  Procedure Laterality Date    ABDOMINAL EXPLORATION SURGERY  1980   CARDIAC CATHETERIZATION  04/2010   with no evidence of ischemia or significant coronary disease to speak of, normal LV function with relatively normal EDP.   LEFT HEART CATHETERIZATION WITH CORONARY ANGIOGRAM N/A 03/18/2013   Procedure: LEFT HEART CATHETERIZATION WITH CORONARY ANGIOGRAM;  Surgeon: Kathleene Hazel, MD;  Location: [facility] CATH LAB;  Service: Cardiovascular;  Laterality: N/A;   NASAL SINUS SURGERY     Family History Family History  Problem Relation Age of Onset   Hypertension Mother    Asthma Mother    Anemia Mother    Hypertension Father    Stroke Father    Hypertension Sister    Atrial fibrillation Brother    Stroke Brother    Hypertension Brother    Stroke Maternal Grandmother    Diabetes Maternal Grandmother    Cancer Maternal Grandfather    Diabetes Paternal Grandmother    Heart attack Paternal Grandfather    Cancer Paternal Grandfather    Diabetes Paternal Grandfather    Hypertension Other    Colon cancer Neg Hx    Esophageal cancer Neg Hx    Inflammatory bowel disease Neg Hx    Liver disease Neg Hx    Pancreatic cancer Neg Hx    Rectal cancer Neg Hx     Social History Social History   Tobacco Use   Smoking status: Never    Passive exposure: Never   Smokeless tobacco: Never  Vaping Use   Vaping status: Never Used  Substance Use Topics   Alcohol use: No    Alcohol/week: 0.0 standard drinks of alcohol   Drug use: No   Allergies Codeine, Amoxicillin, Amoxicillin-pot clavulanate, Bee venom, Elemental sulfur, Latex, Metoprolol, Milk-related compounds, Shellfish allergy, Sulfa antibiotics, and Sulfamethoxazole  Review of Systems Review of Systems  Constitutional:  Positive for chills and fever.  HENT:  Positive for congestion, postnasal drip and rhinorrhea.   Respiratory:  Positive for cough and shortness of breath.   Cardiovascular:  Negative for chest pain and palpitations.  Gastrointestinal:  Positive  for abdominal pain and nausea. Negative for diarrhea and vomiting.  Genitourinary:  Negative for dysuria.  Musculoskeletal:  Positive for arthralgias.  Neurological:  Negative for light-headedness and headaches.  All other systems reviewed and are negative.   Physical Exam Vital Signs  I have reviewed  the triage vital signs BP 126/64   Pulse 62   Temp 98.1 F (36.7 C) (Oral)   Resp 11   Ht 5\' 1"  (1.549 m)   Wt 66.8 kg   LMP 11/16/2009   SpO2 100%   BMI 27.83 kg/m  Physical Exam Vitals and nursing note reviewed.  Constitutional:      General: She is not in acute distress.    Appearance: Normal appearance. She is well-developed.  HENT:     Head: Normocephalic and atraumatic.     Right Ear: External ear normal.     Left Ear: External ear normal.     Nose: Nose normal.     Mouth/Throat:     Mouth: Mucous membranes are moist.  Eyes:     General: No scleral icterus.       Right eye: No discharge.        Left eye: No discharge.  Cardiovascular:     Rate and Rhythm: Normal rate and regular rhythm.     Pulses: Normal pulses.     Heart sounds: Normal heart sounds.  Pulmonary:     Effort: Pulmonary effort is normal. No respiratory distress.     Breath sounds: Normal breath sounds. No stridor.  Abdominal:     General: Abdomen is flat. There is no distension.     Palpations: Abdomen is soft.     Tenderness: There is abdominal tenderness in the suprapubic area and left lower quadrant. There is no guarding or rebound.  Musculoskeletal:     Cervical back: No rigidity.     Right lower leg: No edema.     Left lower leg: No edema.  Skin:    General: Skin is warm and dry.     Capillary Refill: Capillary refill takes less than 2 seconds.  Neurological:     Mental Status: She is alert.  Psychiatric:        Mood and Affect: Mood normal.        Behavior: Behavior normal. Behavior is cooperative.     ED Results and Treatments Labs (all labs ordered are listed, but only  abnormal results are displayed) Labs Reviewed  COMPREHENSIVE METABOLIC PANEL - Abnormal; Notable for the following components:      Result Value   BUN 24 (*)    All other components within normal limits  CBC - Abnormal; Notable for the following components:   HCT 35.8 (*)    All other components within normal limits  URINALYSIS, ROUTINE W REFLEX MICROSCOPIC - Abnormal; Notable for the following components:   Hgb urine dipstick MODERATE (*)    All other components within normal limits  RESP PANEL BY RT-PCR (RSV, FLU A&B, COVID)  RVPGX2  LIPASE, BLOOD  TROPONIN I (HIGH SENSITIVITY)                                                                                                                          Radiology CT ABDOMEN PELVIS W  CONTRAST Result Date: 05/21/2023 CLINICAL DATA:  Acute, nonlocalized abdominal pain EXAM: CT ABDOMEN AND PELVIS WITH CONTRAST TECHNIQUE: Multidetector CT imaging of the abdomen and pelvis was performed using the standard protocol following bolus administration of intravenous contrast. RADIATION DOSE REDUCTION: This exam was performed according to the departmental dose-optimization program which includes automated exposure control, adjustment of the mA and/or kV according to patient size and/or use of iterative reconstruction technique. CONTRAST:  75mL OMNIPAQUE IOHEXOL 350 MG/ML SOLN COMPARISON:  01/28/2022 FINDINGS: Lower chest:  No contributory findings. Hepatobiliary: No focal liver abnormality.No evidence of biliary obstruction or stone. Pancreas: Unremarkable. Spleen: Unremarkable. Adrenals/Urinary Tract: Negative adrenals. Stable kidneys compared to renal MRI in 2023, including benign left upper pole cyst measuring 12 mm. No hydronephrosis or stone. Unremarkable bladder. Stomach/Bowel:  No obstruction. No appendicitis. Vascular/Lymphatic: No acute vascular abnormality. No mass or adenopathy. Reproductive:No pathologic findings. Other: No ascites or pneumoperitoneum.  Musculoskeletal: No acute abnormalities. Degenerative facet spurring in the lumbar spine. IMPRESSION: No acute finding or explanation for pain. Electronically Signed   By: Tiburcio Pea M.D.   On: 05/21/2023 04:36   DG Chest Portable 1 View Result Date: 05/21/2023 CLINICAL DATA:  Dyspnea EXAM: PORTABLE CHEST 1 VIEW COMPARISON:  04/01/2023 FINDINGS: The heart size and mediastinal contours are within normal limits. Both lungs are clear. The visualized skeletal structures are unremarkable. IMPRESSION: No active disease. Electronically Signed   By: Helyn Numbers M.D.   On: 05/21/2023 03:19    Pertinent labs & imaging results that were available during my care of the patient were reviewed by me and considered in my medical decision making (see MDM for details).  Medications Ordered in ED Medications  ketorolac (TORADOL) 15 MG/ML injection 15 mg (15 mg Intravenous Given 05/21/23 0306)  ondansetron (ZOFRAN) injection 4 mg (4 mg Intravenous Given 05/21/23 0336)  iohexol (OMNIPAQUE) 350 MG/ML injection 75 mL (75 mLs Intravenous Contrast Given 05/21/23 0420)                                                                                                                                     Procedures Procedures  (including critical care time)  Medical Decision Making / ED Course    Medical Decision Making:    NETANYA YAZDANI is a 68 y.o. female with past medical history as below, significant for anxiety, asthma, hypertension, migraine headaches, coronary vasospasm who presents to the ED with complaint of multi complaints including nausea, vomiting, dyspnea.. The complaint involves an extensive differential diagnosis and also carries with it a high risk of complications and morbidity.  Serious etiology was considered. Ddx includes but is not limited to: In my evaluation of this patient's dyspnea my DDx includes, but is not limited to, pneumonia, pulmonary embolism, pneumothorax, pulmonary edema, metabolic  acidosis, asthma, COPD, cardiac cause, anemia, anxiety, etc.  Differential diagnosis includes but is not exclusive to ectopic pregnancy, ovarian cyst, ovarian torsion, acute  appendicitis, urinary tract infection, endometriosis, bowel obstruction, hernia, colitis, renal colic, gastroenteritis, volvulus etc.   Complete initial physical exam performed, notably the patient was in no distress, no hypoxia.    Reviewed and confirmed nursing documentation for past medical history, family history, social history.  Vital signs reviewed.         Brief summary: 68 year old female history as above here with myriad of complaints and her exam is reassuring, no hypoxia.  Screening labs ordered in triage which are reassuring.  Will give Zofran, Toradol, get chest x-ray, CT abdomen pelvis.  I have increased the patient for possible viral syndrome given myriad of complaints.   Labs and imaging have resulted, these are stable.  She is feeling much better on recheck.  She is tolerating p.o. without difficulty.  She is ambulatory without assistance.  She has no chest pain, she had some dyspnea earlier today which has since resolved.  Troponin is negative, EKG without acute ischemic change.  Chest x-ray unremarkable.  Suspicion for ACS is low  I favor this is likely a viral syndrome.    I have given abdominal pain return precautions   The patient's overall condition has improved, the patient presents with abdominal pain without signs of peritonitis, or other life-threatening serious etiology. The patient understands that at this time there is no evidence for a more malignant underlying process, but the patient also understands that early in the process of an illness, an emergency department workup can be falsely reassuring. Detailed discussions were had with the patient regarding current findings, and need for close f/u with PCP or on call doctor. The patient appears stable for discharge and has been instructed to  return immediately if the symptoms worsen in any way, or in 24hr if not improved for re-evaluation. Patient verbalized understanding and is in agreement with current care plan.  All questions answered prior to discharge.           Additional history obtained: -Additional history obtained from na -External records from outside source obtained and reviewed including: Chart review including previous notes, labs, imaging, consultation notes including  Prior urgent care documentation, primary care documentation, home medications   Lab Tests: -I ordered, reviewed, and interpreted labs.   The pertinent results include:   Labs Reviewed  COMPREHENSIVE METABOLIC PANEL - Abnormal; Notable for the following components:      Result Value   BUN 24 (*)    All other components within normal limits  CBC - Abnormal; Notable for the following components:   HCT 35.8 (*)    All other components within normal limits  URINALYSIS, ROUTINE W REFLEX MICROSCOPIC - Abnormal; Notable for the following components:   Hgb urine dipstick MODERATE (*)    All other components within normal limits  RESP PANEL BY RT-PCR (RSV, FLU A&B, COVID)  RVPGX2  LIPASE, BLOOD  TROPONIN I (HIGH SENSITIVITY)    Notable for labs are stable  EKG   EKG Interpretation Date/Time:  Saturday May 20 2023 21:30:32 EST Ventricular Rate:  60 PR Interval:  138 QRS Duration:  82 QT Interval:  426 QTC Calculation: 426 R Axis:   87  Text Interpretation: Normal sinus rhythm Cannot rule out Anterior infarct , age undetermined Abnormal ECG When compared with ECG of 01-Apr-2023 12:15, PREVIOUS ECG IS PRESENT Confirmed by Tanda Rockers ([id number]) on 05/21/2023 2:49:07 AM         Imaging Studies ordered: I ordered imaging studies including cxr ctap I independently visualized the following  imaging with scope of interpretation limited to determining acute life threatening conditions related to emergency care; findings noted above I  independently visualized and interpreted imaging. I agree with the radiologist interpretation   Medicines ordered and prescription drug management: Meds ordered this encounter  Medications   ketorolac (TORADOL) 15 MG/ML injection 15 mg   ondansetron (ZOFRAN) injection 4 mg   iohexol (OMNIPAQUE) 350 MG/ML injection 75 mL   dicyclomine (BENTYL) 20 MG tablet    Sig: Take 1 tablet (20 mg total) by mouth 2 (two) times daily.    Dispense:  20 tablet    Refill:  0   ondansetron (ZOFRAN) 4 MG tablet    Sig: Take 1 tablet (4 mg total) by mouth every 4 (four) hours as needed for nausea or vomiting.    Dispense:  12 tablet    Refill:  0    -I have reviewed the patients home medicines and have made adjustments as needed   Consultations Obtained: Not applicable  Cardiac Monitoring: Continuous pulse oximetry interpreted by myself, 99% on RA.    Social Determinants of Health:  Diagnosis or treatment significantly limited by social determinants of health: na   Reevaluation: After the interventions noted above, I reevaluated the patient and found that they have improved  Co morbidities that complicate the patient evaluation  Past Medical History:  Diagnosis Date   Allergy    Anemia    Anxiety    Asthma    Eczema    H/O exercise stress test 2008; October 2014   was normal in 2008; positive for ischemia with inferior and lateral ST depressions October 2014   Hx of echocardiogram 091/2012   relatively normal as well. No significant valvar lesions. Normal Ef.   Hypertension    Lower extremity edema 05/18/2021   PRN Furosemide   Migraines    Palpitations       Dispostion: Disposition decision including need for hospitalization was considered, and patient discharged from emergency department.    Final Clinical Impression(s) / ED Diagnoses Final diagnoses:  Abdominal pain, unspecified abdominal location  Viral syndrome        Sloan Leiter, DO 05/21/23 [phone]

## 2023-05-23 DIAGNOSIS — B349 Viral infection, unspecified: Secondary | ICD-10-CM | POA: Diagnosis not present

## 2023-05-23 DIAGNOSIS — I10 Essential (primary) hypertension: Secondary | ICD-10-CM | POA: Diagnosis not present

## 2023-05-23 DIAGNOSIS — Z789 Other specified health status: Secondary | ICD-10-CM | POA: Diagnosis not present

## 2023-05-25 ENCOUNTER — Telehealth: Payer: Self-pay | Admitting: Cardiovascular Disease

## 2023-05-25 NOTE — Telephone Encounter (Signed)
 Pt c/o Shortness Of Breath: STAT if SOB developed within the last 24 hours or pt is noticeably SOB on the phone  1. Are you currently SOB (can you hear that pt is SOB on the phone)?   Yes  2. How long have you been experiencing SOB?   This morning around 5:00 am  3. Are you SOB when sitting or when up moving around?   Mostly when up and moving around but happens also when she is sitting  4. Are you currently experiencing any other symptoms?   Chest pains off and on   Patient stated she went to the ED on Saturday and they did an EKG and blood work.  Patient stated she is sometimes wobbly on her feet.  Patient stated she has also been having chest pains this morning which comes and goes.  Patient noted she has not taken any nitroglycerin .

## 2023-05-25 NOTE — Telephone Encounter (Signed)
 From Dr. Verlin: Can we get her into the office over the next few days? Chris   Scheduled patient with DOD (Dr. Shlomo) on Monday 9:40 am ____________________________________________________   Called patient and let her know.  She would still like to be seen tomorrow if possible.  Added to wait list and advised pt I will keep an eye on schedules for tomorrow and call her if something opens up.

## 2023-05-25 NOTE — Telephone Encounter (Signed)
 Pt complaining of SOB and fatigue/weakness on exertion x 5 days.  Pt states she developed intermittent CP this morning in the center of her chest lasting for about 2 minutes.  Pt denies current symptoms.  BP this am 134/78 and HR-66. Pt reports she was seen in the ED on Saturday, 05/20/23 and evaluated and discharged home.  Pt followed up with her PCP on Tuesday via virtual visit and was advised lab work did not indicate any acute process.  Pt states she would like to schedule an appointment with Dr Verlin this week.  Pt advised will forward to Dr Verlin and his RN for review and further recommendation.  Reviewed ED precautions.  Pt verbalizes understanding and agrees with current plan.

## 2023-05-26 DIAGNOSIS — E049 Nontoxic goiter, unspecified: Secondary | ICD-10-CM | POA: Diagnosis not present

## 2023-05-26 DIAGNOSIS — Z6827 Body mass index (BMI) 27.0-27.9, adult: Secondary | ICD-10-CM | POA: Diagnosis not present

## 2023-05-26 DIAGNOSIS — R030 Elevated blood-pressure reading, without diagnosis of hypertension: Secondary | ICD-10-CM | POA: Diagnosis not present

## 2023-05-26 NOTE — Telephone Encounter (Signed)
 Patient rescheduled to see Dr. [place] (DOD) on Monday at 1:20pm. Dr. Micael Adas has a doctor's appointment at time of patient's appt. FYI

## 2023-05-29 ENCOUNTER — Ambulatory Visit: Payer: Medicare PPO | Admitting: Cardiology

## 2023-05-29 ENCOUNTER — Ambulatory Visit: Payer: Medicare PPO | Attending: Cardiology | Admitting: Cardiology

## 2023-05-29 ENCOUNTER — Encounter: Payer: Self-pay | Admitting: Cardiology

## 2023-05-29 VITALS — BP 119/75 | HR 74 | Ht 61.0 in | Wt 143.8 lb

## 2023-05-29 DIAGNOSIS — R42 Dizziness and giddiness: Secondary | ICD-10-CM | POA: Diagnosis not present

## 2023-05-29 DIAGNOSIS — R002 Palpitations: Secondary | ICD-10-CM | POA: Diagnosis not present

## 2023-05-29 NOTE — Patient Instructions (Addendum)
 Medication Instructions:  Continue same medications  Lab Work: None ordered   Testing/Procedures: None ordered   Follow-Up: At [facility], you and your health needs are our priority.  As part of our continuing mission to provide you with exceptional heart care, we have created designated Provider Care Teams.  These Care Teams include your primary Cardiologist (physician) and Advanced Practice Providers (APPs -  Physician Assistants and Nurse Practitioners) who all work together to provide you with the care you need, when you need it.  We recommend signing up for the patient portal called "MyChart".  Sign up information is provided on this After Visit Summary.  MyChart is used to connect with patients for Virtual Visits (Telemedicine).  Patients are able to view lab/test results, encounter notes, upcoming appointments, etc.  Non-urgent messages can be sent to your provider as well.   To learn more about what you can do with MyChart, go to [email or web address].    Your next appointment:  As Needed    Provider: Dr.Jordan

## 2023-05-29 NOTE — Progress Notes (Signed)
  Cardiology Office Note:  .   Date:  05/29/2023  ID:  Bianca [name], DOB August 02, 1955, MRN [id number] PCP: Rae Bugler, MD  [facility] Providers Cardiologist:  Antoinette Batman, MD    History of Present Illness: Bianca [name]   Bianca [name] is a 68 y.o. female seen as a work in for evaluation of chest pain, fatigue and SOB. She has a history of chronic chest pain, possible coronary vasospasm, HTN, asthma, SVT, and PVCs. Was last seen by Dr Abel Hoe in October. She had normal cath in 2012 and 2014. Has been on chronic amlodipine . She was seen in our office in April 2024 with c/o mild dizziness and palpitations. She was evaluated in Neurology. Brain MRI with no acute process. Cardiac monitor April 2024 with sinus, 2 short runs of SVT (longest 5 beats), rare PACs, rare PVCs. Echo July 2024 with LVEF=60-65%, no significant valve disease.   She was seen in the ED on 05/20/23 with complaints of N/V, dyspnea, myalgias, cough. Also had some lower abdominal pain.  Evaluation in the ED included normal CXR, Abd CT, Ecg, and blood work including CBC, CMET, troponin. She was given Toradol  and Zofran . Resp panel negative. Felt to have a viral syndrome. Symptom improved and she was DC home. She was seen by her PCP this past week who noted BP low at 98 systolic and recommended holding her amlodipine . She still has a sensation of feeling lightheaded and wobbly. Some SOB.    ROS:   Studies Reviewed: .         Risk Assessment/Calculations:             Physical Exam:   VS:  BP 119/75   Pulse 74   Ht 5\' 1"  (1.549 m)   Wt 143 lb 12.8 oz (65.2 kg)   LMP 11/16/2009   SpO2 95%   BMI 27.17 kg/m    Wt Readings from Last 3 Encounters:  05/29/23 143 lb 12.8 oz (65.2 kg)  05/20/23 147 lb 4.3 oz (66.8 kg)  05/11/23 147 lb 3.2 oz (66.8 kg)    GEN: Well nourished, well developed in no acute distress NECK: No JVD; No carotid bruits CARDIAC: RRR, no murmurs, rubs, gallops RESPIRATORY:  Clear to auscultation  without rales, wheezing or rhonchi  ABDOMEN: Soft, non-tender, non-distended EXTREMITIES:  No edema; No deformity   ASSESSMENT AND PLAN: .    Symptoms of lightheadedness and SOB. Fairly extensive evaluation recently in ED and has had event monitor and Echo this past summer. I don't see a cardiac cause for these symptoms. I don't have a problem with holding amlodipine . She was on a very low dose to begin with and this is the only medication which would lower her BP. She hasn't had any symptoms of angina. Patient reassured and will follow up with Dr Abel Hoe PRN       Signed, Tari Lecount [place], MD

## 2023-06-06 DIAGNOSIS — F411 Generalized anxiety disorder: Secondary | ICD-10-CM | POA: Diagnosis not present

## 2023-06-08 DIAGNOSIS — F419 Anxiety disorder, unspecified: Secondary | ICD-10-CM | POA: Diagnosis not present

## 2023-06-08 DIAGNOSIS — J45909 Unspecified asthma, uncomplicated: Secondary | ICD-10-CM | POA: Diagnosis not present

## 2023-06-08 DIAGNOSIS — E049 Nontoxic goiter, unspecified: Secondary | ICD-10-CM | POA: Diagnosis not present

## 2023-06-08 DIAGNOSIS — Z0000 Encounter for general adult medical examination without abnormal findings: Secondary | ICD-10-CM | POA: Diagnosis not present

## 2023-06-08 DIAGNOSIS — R42 Dizziness and giddiness: Secondary | ICD-10-CM | POA: Diagnosis not present

## 2023-06-08 DIAGNOSIS — G4700 Insomnia, unspecified: Secondary | ICD-10-CM | POA: Diagnosis not present

## 2023-06-08 DIAGNOSIS — E7800 Pure hypercholesterolemia, unspecified: Secondary | ICD-10-CM | POA: Diagnosis not present

## 2023-06-08 DIAGNOSIS — I10 Essential (primary) hypertension: Secondary | ICD-10-CM | POA: Diagnosis not present

## 2023-06-08 DIAGNOSIS — R609 Edema, unspecified: Secondary | ICD-10-CM | POA: Diagnosis not present
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
## 2023-06-14 ENCOUNTER — Encounter: Payer: Self-pay | Admitting: Neurology

## 2023-06-14 ENCOUNTER — Ambulatory Visit: Payer: Medicare PPO | Admitting: Neurology

## 2023-06-14 VITALS — Ht 61.0 in | Wt 144.0 lb

## 2023-06-14 DIAGNOSIS — E049 Nontoxic goiter, unspecified: Secondary | ICD-10-CM | POA: Diagnosis not present

## 2023-06-14 DIAGNOSIS — M542 Cervicalgia: Secondary | ICD-10-CM | POA: Diagnosis not present

## 2023-06-14 DIAGNOSIS — R42 Dizziness and giddiness: Secondary | ICD-10-CM | POA: Diagnosis not present

## 2023-06-14 NOTE — Patient Instructions (Signed)
 I encourage you to keep your psychiatry appointment next month.  I do not have a neurologic reason currently for your lightheadedness. Your brain and blood vessels in the brain and neck are all normal.  The physicians and staff at [facility] are committed to providing excellent care. You may receive a survey requesting feedback about your experience at our office. We strive to receive "very good" responses to the survey questions. If you feel that your experience would prevent you from giving the office a "very good " response, please contact our office to try to remedy the situation. We may be reached at [phone]. Thank you for taking the time out of your busy day to complete the survey.  Jacquelyne Balint, MD [facility]

## 2023-06-19 DIAGNOSIS — K581 Irritable bowel syndrome with constipation: Secondary | ICD-10-CM | POA: Diagnosis not present

## 2023-06-19 DIAGNOSIS — K219 Gastro-esophageal reflux disease without esophagitis: Secondary | ICD-10-CM | POA: Diagnosis not present

## 2023-06-19 DIAGNOSIS — E7800 Pure hypercholesterolemia, unspecified: Secondary | ICD-10-CM | POA: Diagnosis not present

## 2023-06-22 DIAGNOSIS — R1032 Left lower quadrant pain: Secondary | ICD-10-CM | POA: Diagnosis not present

## 2023-06-22 DIAGNOSIS — K219 Gastro-esophageal reflux disease without esophagitis: Secondary | ICD-10-CM | POA: Diagnosis not present

## 2023-06-22 DIAGNOSIS — F419 Anxiety disorder, unspecified: Secondary | ICD-10-CM | POA: Diagnosis not present

## 2023-06-28 ENCOUNTER — Ambulatory Visit (HOSPITAL_COMMUNITY)
Admission: EM | Admit: 2023-06-28 | Discharge: 2023-06-28 | Disposition: A | Discharge status: Home / Self Care | Attending: Sports Medicine | Admitting: Sports Medicine

## 2023-06-28 ENCOUNTER — Encounter (HOSPITAL_COMMUNITY): Payer: Self-pay

## 2023-06-28 ENCOUNTER — Other Ambulatory Visit: Payer: Self-pay

## 2023-06-28 DIAGNOSIS — F411 Generalized anxiety disorder: Secondary | ICD-10-CM | POA: Diagnosis not present

## 2023-06-28 DIAGNOSIS — M542 Cervicalgia: Secondary | ICD-10-CM

## 2023-06-28 DIAGNOSIS — K219 Gastro-esophageal reflux disease without esophagitis: Secondary | ICD-10-CM | POA: Diagnosis not present

## 2023-06-28 MED ORDER — PANTOPRAZOLE SODIUM 20 MG PO TBEC: 20.0000 mg | DELAYED_RELEASE_TABLET | Freq: Every day | ORAL | 0 refills | Status: DC

## 2023-06-28 NOTE — Discharge Instructions (Signed)
 We are going to try a 2 week course of a PPI, pantoprazole, to help calm down your reflux. After the 2 weeks, return back to your pepcid for baseline control of the reflux.  If things are failing to improve over the next 2 weeks, please follow-up with your GI doctor.

## 2023-06-28 NOTE — ED Triage Notes (Signed)
 Pt states has been having heart burn for past few weeks and taking Pepcid with relief. States having rt sided neck pain since last night.

## 2023-06-28 NOTE — ED Provider Notes (Signed)
 [facility]    CSN: [id number] Arrival date & time: 06/28/23  0801      History   Chief Complaint Chief Complaint  Patient presents with   Neck Pain    HPI Bianca [name] is a 68 y.o. female.   Patient is a 68 y.o. female with past medical history significant for anxiety, hypertension, migraine headaches, coronary vasospasm, IBS and reflux who presents with a week of burning reflux that has progressed to burning right sided neck pain last night. She typically manages her chronic reflux with Pepcid 20 mg in the morning 2 mg the afternoon though last week transition to Prevacid 15 mg states that this was unhelpful.  She also started trying Gaviscon, though report this gave her constipation yesterday - to treat this she took prune juice last night.  Shortly after the prune juice she developed burning throat pain that she rated somewhere between a 5 and 8 out of 10 on the right that kept her up most the night.  This morning she rates it at a 2-3 out of 10.  She had some accompanying nausea this morning that resolved with breakfast.  She denies any dysphagia, odynophagia, chest pain, palpitations, shortness of breath.  She has had roughly 5 pounds of unintentional weight loss over the past month the reports that she had an upper endoscopy back in October 2024 that was normal.   Neck Pain   Past Medical History:  Diagnosis Date   Allergy    Anemia    Anxiety    Asthma    Eczema    H/O exercise stress test 2008; October 2014   was normal in 2008; positive for ischemia with inferior and lateral ST depressions October 2014   Hx of echocardiogram 091/2012   relatively normal as well. No significant valvar lesions. Normal Ef.   Hypertension    Lower extremity edema 05/18/2021   PRN Furosemide   Migraines    Palpitations     Patient Active Problem List   Diagnosis Date Noted   Coronary vasospasm (HCC) 05/18/2021   Premature atrial contractions 05/18/2021   Lower  extremity edema 05/18/2021   Hemorrhoids 05/18/2021   Rectal pain 03/23/2021   Change in bowel habits 03/23/2021   RLQ abdominal pain 03/23/2021   Right flank pain 03/23/2021   Acute midline thoracic back pain 03/23/2021   Chest pain 03/13/2013   Abnormal stress ECG with treadmill 02/24/2013   HTN (hypertension) 12/24/2011   Heart palpitations 12/24/2011   Awareness of heartbeats 11/30/2011    Past Surgical History:  Procedure Laterality Date   ABDOMINAL EXPLORATION SURGERY  1980   CARDIAC CATHETERIZATION  04/2010   with no evidence of ischemia or significant coronary disease to speak of, normal LV function with relatively normal EDP.   LEFT HEART CATHETERIZATION WITH CORONARY ANGIOGRAM N/A 03/18/2013   Procedure: LEFT HEART CATHETERIZATION WITH CORONARY ANGIOGRAM;  Surgeon: Kathleene Hazel, MD;  Location: [facility] CATH LAB;  Service: Cardiovascular;  Laterality: N/A;   NASAL SINUS SURGERY      OB History   No obstetric history on file.      Home Medications    Prior to Admission medications   Medication Sig Start Date End Date Taking? Authorizing Provider  pantoprazole (PROTONIX) 20 MG tablet Take 1 tablet (20 mg total) by mouth daily. 06/28/23  Yes Marisa Cyphers, MD  acetaminophen (TYLENOL) 500 MG tablet Take 500 mg by mouth every 6 (six) hours as needed for mild  pain (pain score 1-3).    [provider]  albuterol (VENTOLIN HFA) 108 (90 Base) MCG/ACT inhaler Inhale 1-2 puffs into the lungs every 6 (six) hours as needed for wheezing or shortness of breath. 01/30/23   Kozlow, Alvira Philips, MD  amLODipine (NORVASC) 2.5 MG tablet Take 1 tablet (2.5 mg total) by mouth 2 (two) times daily. Patient taking differently: Take 2.5 mg by mouth daily. 01/24/23   Kathleene Hazel, MD  ascorbic acid (VITAMIN C) 500 MG tablet Take 500 mg by mouth daily.    [provider]  cholecalciferol (VITAMIN D3) 25 MCG (1000 UNIT) tablet Take 1,000 Units by mouth daily.     [provider]  dicyclomine (BENTYL) 20 MG tablet Take 1 tablet (20 mg total) by mouth 2 (two) times daily. 05/21/23   Sloan Leiter, DO  EPINEPHrine 0.3 mg/0.3 mL IJ SOAJ injection Inject 0.3 mg into the muscle as needed for anaphylaxis. 11/08/22   Alfonse Spruce, MD  famotidine (PEPCID) 20 MG tablet Take 20 mg by mouth daily. 12/29/22   [provider]  fluticasone (FLONASE) 50 MCG/ACT nasal spray Place 1 spray into both nostrils as needed for allergies.    [provider]  guaiFENesin (MUCINEX) 600 MG 12 hr tablet Take by mouth as needed.     [provider]  levocetirizine (XYZAL) 5 MG tablet Take 1 tablet (5 mg total) by mouth every evening. Patient taking differently: Take 5 mg by mouth daily as needed for allergies. 10/17/22   Alfonse Spruce, MD  ondansetron (ZOFRAN) 4 MG tablet Take 1 tablet (4 mg total) by mouth every 4 (four) hours as needed for nausea or vomiting. 05/21/23   Sloan Leiter, DO    Family History Family History  Problem Relation Age of Onset   Hypertension Mother    Asthma Mother    Anemia Mother    Hypertension Father    Stroke Father    Hypertension Sister    Atrial fibrillation Brother    Stroke Brother    Hypertension Brother    Stroke Maternal Grandmother    Diabetes Maternal Grandmother    Cancer Maternal Grandfather    Diabetes Paternal Grandmother    Heart attack Paternal Grandfather    Cancer Paternal Grandfather    Diabetes Paternal Grandfather    Hypertension Other    Colon cancer Neg Hx    Esophageal cancer Neg Hx    Inflammatory bowel disease Neg Hx    Liver disease Neg Hx    Pancreatic cancer Neg Hx    Rectal cancer Neg Hx     Social History Social History   Tobacco Use   Smoking status: Never    Passive exposure: Never   Smokeless tobacco: Never  Vaping Use   Vaping status: Never Used  Substance Use Topics   Alcohol use: No    Alcohol/week: 0.0 standard drinks of alcohol   Drug use:  No     Allergies   Codeine, Amoxicillin, Amoxicillin-pot clavulanate, Bee venom, Elemental sulfur, Latex, Metoprolol, Milk-related compounds, Shellfish allergy, Sulfa antibiotics, and Sulfamethoxazole   Review of Systems Review of Systems  Musculoskeletal:  Positive for neck pain.     Physical Exam Triage Vital Signs ED Triage Vitals  Encounter Vitals Group     BP 06/28/23 0820 124/84     Systolic BP Percentile --      Diastolic BP Percentile --      Pulse Rate 06/28/23 0820 72  Resp 06/28/23 0820 18     Temp 06/28/23 0820 98.4 F (36.9 C)     Temp Source 06/28/23 0820 Oral     SpO2 06/28/23 0820 98 %     Weight --      Height --      Head Circumference --      Peak Flow --      Pain Score 06/28/23 0821 3     Pain Loc --      Pain Education --      Exclude from Growth Chart --    No data found.  Updated Vital Signs BP 124/84 (BP Location: Left Arm)   Pulse 72   Temp 98.4 F (36.9 C) (Oral)   Resp 18   LMP 11/16/2009   SpO2 98%   Visual Acuity Right Eye Distance:   Left Eye Distance:   Bilateral Distance:    Right Eye Near:   Left Eye Near:    Bilateral Near:     Physical Exam Constitutional:      Appearance: Normal appearance. She is normal weight.  HENT:     Head: Normocephalic and atraumatic.     Nose: Nose normal.     Mouth/Throat:     Mouth: Mucous membranes are moist.     Pharynx: Oropharynx is clear. No oropharyngeal exudate or posterior oropharyngeal erythema.  Eyes:     Extraocular Movements: Extraocular movements intact.     Pupils: Pupils are equal, round, and reactive to light.  Neck:     Vascular: No carotid bruit.  Cardiovascular:     Rate and Rhythm: Normal rate and regular rhythm.     Pulses: Normal pulses.     Heart sounds: Normal heart sounds.  Pulmonary:     Effort: Pulmonary effort is normal. No respiratory distress.     Breath sounds: Normal breath sounds.  Abdominal:     General: There is no distension.      Palpations: Abdomen is soft.     Tenderness: There is abdominal tenderness. There is no guarding or rebound.     Comments: Mild epigastric TTP  Musculoskeletal:        General: Normal range of motion.     Cervical back: Normal range of motion and neck supple. Tenderness present. No rigidity.  Lymphadenopathy:     Cervical: No cervical adenopathy.  Skin:    General: Skin is warm.     Capillary Refill: Capillary refill takes less than 2 seconds.  Neurological:     General: No focal deficit present.     Mental Status: She is alert and oriented to person, place, and time.  Psychiatric:        Mood and Affect: Mood normal.        Behavior: Behavior normal.      UC Treatments / Results  Labs (all labs ordered are listed, but only abnormal results are displayed) Labs Reviewed - No data to display  EKG   Radiology No results found.  Procedures Procedures (including critical care time)  Medications Ordered in UC Medications - No data to display  Initial Impression / Assessment and Plan / UC Course  I have reviewed the triage vital signs and the nursing notes.  Pertinent labs & imaging results that were available during my care of the patient were reviewed by me and considered in my medical decision making (see chart for details).     Vitals and triage reviewed, patient is hemodynamically stable.  Patient's presentation  appears most consistent with reflux induced neck pain.  Considered angina though her EKG is in sinus rhythm without any ST segment abnormalities concerning for ischemia.  Encouraged she try a 2-week course of pantoprazole then transition back to her Pepcid.  We discussed supportive measures of eating/drinking cold soft foods over the next couple days as well.  Advise follow-up with her GI specialist if her symptoms are not improving.  Patient's questions answered and she is in agreement this plan. Stable for discharge.  Final Clinical Impressions(s) / UC  Diagnoses   Final diagnoses:  Neck pain  Gastroesophageal reflux disease, unspecified whether esophagitis present     Discharge Instructions      We are going to try a 2 week course of a PPI, pantoprazole, to help calm down your reflux. After the 2 weeks, return back to your pepcid for baseline control of the reflux.  If things are failing to improve over the next 2 weeks, please follow-up with your GI doctor.   ED Prescriptions     Medication Sig Dispense Auth. Provider   pantoprazole (PROTONIX) 20 MG tablet Take 1 tablet (20 mg total) by mouth daily. 14 tablet Marisa Cyphers, MD      PDMP not reviewed this encounter.   Marisa Cyphers, MD 06/28/23 [phone]

## 2023-07-03 DIAGNOSIS — E049 Nontoxic goiter, unspecified: Secondary | ICD-10-CM | POA: Diagnosis not present

## 2023-07-03 DIAGNOSIS — R0982 Postnasal drip: Secondary | ICD-10-CM | POA: Diagnosis not present

## 2023-07-03 DIAGNOSIS — K219 Gastro-esophageal reflux disease without esophagitis: Secondary | ICD-10-CM | POA: Diagnosis not present

## 2023-07-03 DIAGNOSIS — R5383 Other fatigue: Secondary | ICD-10-CM | POA: Diagnosis not present

## 2023-07-04 DIAGNOSIS — E049 Nontoxic goiter, unspecified: Secondary | ICD-10-CM | POA: Diagnosis not present

## 2023-07-04 DIAGNOSIS — E069 Thyroiditis, unspecified: Secondary | ICD-10-CM | POA: Diagnosis not present

## 2023-07-04 DIAGNOSIS — R198 Other specified symptoms and signs involving the digestive system and abdomen: Secondary | ICD-10-CM | POA: Diagnosis not present

## 2023-07-04 DIAGNOSIS — L603 Nail dystrophy: Secondary | ICD-10-CM | POA: Diagnosis not present

## 2023-07-04 DIAGNOSIS — E042 Nontoxic multinodular goiter: Secondary | ICD-10-CM | POA: Diagnosis not present

## 2023-07-04 DIAGNOSIS — E061 Subacute thyroiditis: Secondary | ICD-10-CM | POA: Diagnosis not present

## 2023-07-04 DIAGNOSIS — L678 Other hair color and hair shaft abnormalities: Secondary | ICD-10-CM | POA: Diagnosis not present

## 2023-07-04 DIAGNOSIS — K5900 Constipation, unspecified: Secondary | ICD-10-CM | POA: Diagnosis not present

## 2023-07-04 DIAGNOSIS — R450 Nervousness: Secondary | ICD-10-CM | POA: Diagnosis not present

## 2023-07-04 DIAGNOSIS — M542 Cervicalgia: Secondary | ICD-10-CM | POA: Diagnosis not present

## 2023-07-04 DIAGNOSIS — E0789 Other specified disorders of thyroid: Secondary | ICD-10-CM | POA: Diagnosis not present

## 2023-07-10 DIAGNOSIS — I10 Essential (primary) hypertension: Secondary | ICD-10-CM | POA: Diagnosis not present

## 2023-07-10 DIAGNOSIS — F411 Generalized anxiety disorder: Secondary | ICD-10-CM | POA: Diagnosis not present

## 2023-07-12 DIAGNOSIS — F411 Generalized anxiety disorder: Secondary | ICD-10-CM | POA: Diagnosis not present

## 2023-07-26 DIAGNOSIS — R634 Abnormal weight loss: Secondary | ICD-10-CM | POA: Diagnosis not present

## 2023-07-26 DIAGNOSIS — F419 Anxiety disorder, unspecified: Secondary | ICD-10-CM | POA: Diagnosis not present

## 2023-07-26 DIAGNOSIS — R197 Diarrhea, unspecified: Secondary | ICD-10-CM | POA: Diagnosis not present

## 2023-07-26 DIAGNOSIS — F411 Generalized anxiety disorder: Secondary | ICD-10-CM | POA: Diagnosis not present

## 2023-07-26 DIAGNOSIS — R300 Dysuria: Secondary | ICD-10-CM | POA: Diagnosis not present

## 2023-07-26 DIAGNOSIS — L819 Disorder of pigmentation, unspecified: Secondary | ICD-10-CM | POA: Diagnosis not present

## 2023-07-26 DIAGNOSIS — E061 Subacute thyroiditis: Secondary | ICD-10-CM | POA: Diagnosis not present

## 2023-07-26 DIAGNOSIS — E0789 Other specified disorders of thyroid: Secondary | ICD-10-CM | POA: Diagnosis not present

## 2023-07-26 DIAGNOSIS — E049 Nontoxic goiter, unspecified: Secondary | ICD-10-CM | POA: Diagnosis not present

## 2023-07-28 DIAGNOSIS — R197 Diarrhea, unspecified: Secondary | ICD-10-CM | POA: Diagnosis not present

## 2023-07-28 DIAGNOSIS — Z1231 Encounter for screening mammogram for malignant neoplasm of breast: Secondary | ICD-10-CM | POA: Diagnosis not present

## 2023-07-28 DIAGNOSIS — E061 Subacute thyroiditis: Secondary | ICD-10-CM | POA: Diagnosis not present

## 2023-07-28 DIAGNOSIS — R634 Abnormal weight loss: Secondary | ICD-10-CM | POA: Diagnosis not present

## 2023-07-28 DIAGNOSIS — L819 Disorder of pigmentation, unspecified: Secondary | ICD-10-CM | POA: Diagnosis not present

## 2023-08-01 ENCOUNTER — Encounter (HOSPITAL_BASED_OUTPATIENT_CLINIC_OR_DEPARTMENT_OTHER): Payer: Self-pay | Admitting: Emergency Medicine

## 2023-08-01 ENCOUNTER — Other Ambulatory Visit: Payer: Self-pay

## 2023-08-01 ENCOUNTER — Emergency Department (HOSPITAL_BASED_OUTPATIENT_CLINIC_OR_DEPARTMENT_OTHER)

## 2023-08-01 ENCOUNTER — Emergency Department (HOSPITAL_BASED_OUTPATIENT_CLINIC_OR_DEPARTMENT_OTHER)
Admission: EM | Admit: 2023-08-01 | Discharge: 2023-08-01 | Disposition: A | Discharge status: Home / Self Care | Attending: Emergency Medicine | Admitting: Emergency Medicine

## 2023-08-01 DIAGNOSIS — R519 Headache, unspecified: Secondary | ICD-10-CM | POA: Diagnosis not present

## 2023-08-01 DIAGNOSIS — Z91040 Latex allergy status: Secondary | ICD-10-CM | POA: Diagnosis not present

## 2023-08-01 DIAGNOSIS — R944 Abnormal results of kidney function studies: Secondary | ICD-10-CM | POA: Insufficient documentation

## 2023-08-01 DIAGNOSIS — R55 Syncope and collapse: Secondary | ICD-10-CM | POA: Diagnosis not present

## 2023-08-01 DIAGNOSIS — E049 Nontoxic goiter, unspecified: Secondary | ICD-10-CM | POA: Diagnosis not present

## 2023-08-01 DIAGNOSIS — Z79899 Other long term (current) drug therapy: Secondary | ICD-10-CM | POA: Insufficient documentation

## 2023-08-01 DIAGNOSIS — M542 Cervicalgia: Secondary | ICD-10-CM | POA: Diagnosis not present

## 2023-08-01 DIAGNOSIS — I6782 Cerebral ischemia: Secondary | ICD-10-CM | POA: Diagnosis not present

## 2023-08-01 DIAGNOSIS — R42 Dizziness and giddiness: Secondary | ICD-10-CM | POA: Insufficient documentation

## 2023-08-01 DIAGNOSIS — I10 Essential (primary) hypertension: Secondary | ICD-10-CM | POA: Diagnosis not present

## 2023-08-01 LAB — URINALYSIS, ROUTINE W REFLEX MICROSCOPIC
Bacteria, UA: NONE SEEN
Bilirubin Urine: NEGATIVE
Glucose, UA: NEGATIVE mg/dL
Ketones, ur: NEGATIVE mg/dL
Leukocytes,Ua: NEGATIVE
Nitrite: NEGATIVE
Protein, ur: NEGATIVE mg/dL
Specific Gravity, Urine: 1.008 (ref 1.005–1.030)
pH: 5 (ref 5.0–8.0)

## 2023-08-01 LAB — BASIC METABOLIC PANEL WITH GFR
Anion gap: 6 (ref 5–15)
BUN: 20 mg/dL (ref 8–23)
CO2: 29 mmol/L (ref 22–32)
Calcium: 10 mg/dL (ref 8.9–10.3)
Chloride: 102 mmol/L (ref 98–111)
Creatinine, Ser: 1.05 mg/dL — ABNORMAL HIGH (ref 0.44–1.00)
GFR, Estimated: 58 mL/min — ABNORMAL LOW (ref >60)
Glucose, Bld: 86 mg/dL (ref 70–99)
Potassium: 4.3 mmol/L (ref 3.5–5.1)
Sodium: 137 mmol/L (ref 135–145)

## 2023-08-01 LAB — CBC
HCT: 37.2 % (ref 36.0–46.0)
Hemoglobin: 13.3 g/dL (ref 12.0–15.0)
MCH: 28.3 pg (ref 26.0–34.0)
MCHC: 35.8 g/dL (ref 30.0–36.0)
MCV: 79.1 fL — ABNORMAL LOW (ref 80.0–100.0)
Platelets: 369 10*3/uL (ref 150–400)
RBC: 4.7 MIL/uL (ref 3.87–5.11)
RDW: 13.9 % (ref 11.5–15.5)
WBC: 4.8 10*3/uL (ref 4.0–10.5)
nRBC: 0 % (ref 0.0–0.2)

## 2023-08-01 LAB — CBG MONITORING, ED: Glucose-Capillary: 85 mg/dL (ref 70–99)

## 2023-08-01 MED ORDER — DIPHENHYDRAMINE HCL 50 MG/ML IJ SOLN
12.5000 mg | Freq: Once | INTRAMUSCULAR | Status: AC
  Administered 2023-08-01: 12.5 mg via INTRAVENOUS
  Filled 2023-08-01: qty 1

## 2023-08-01 MED ORDER — METOCLOPRAMIDE HCL 5 MG/ML IJ SOLN
5.0000 mg | Freq: Once | INTRAMUSCULAR | Status: AC
  Administered 2023-08-01: 5 mg via INTRAVENOUS
  Filled 2023-08-01: qty 2

## 2023-08-01 MED ORDER — IOHEXOL 350 MG/ML SOLN
100.0000 mL | Freq: Once | INTRAVENOUS | Status: AC | PRN
  Administered 2023-08-01: 100 mL via INTRAVENOUS

## 2023-08-01 MED ORDER — SODIUM CHLORIDE 0.9 % IV BOLUS
500.0000 mL | Freq: Once | INTRAVENOUS | Status: AC
  Administered 2023-08-01: 500 mL via INTRAVENOUS

## 2023-08-01 NOTE — ED Provider Notes (Signed)
 [place] EMERGENCY DEPARTMENT AT [facility] Provider Note   CSN: [id number] Arrival date & time: 08/01/23  1141     History  Chief Complaint  Patient presents with   Dizziness    Bianca [name] is a 68 y.o. female.  Patient with history of hypertension, palpitations, dizziness --presents to the emergency department for evaluation of lightheadedness.  Patient states that she has had intermittent symptoms of heaviness in her head with lightheaded sensation without syncope.  This has been a recurrent problem for the patient, she was worked up last year by neurology.  She had CT head and neck and MRA head and neck which were negative.  Symptoms have been worse over the past couple of days.  She has pressure that goes from the top of her head down her neck.  No vision change.  No nausea or vomiting.  No significant headache.  No real vertigo symptoms.  Today she felt some tingling of her left upper lip which was concerning.  She states that her symptoms are severe enough over the past few days where it has "really scared me". Patient denies signs of stroke including: facial droop, slurred speech, aphasia, weakness/numbness in extremities, imbalance/trouble walking.        Home Medications Prior to Admission medications   Medication Sig Start Date End Date Taking? Authorizing Provider  acetaminophen (TYLENOL) 500 MG tablet Take 500 mg by mouth every 6 (six) hours as needed for mild pain (pain score 1-3).    [provider]  albuterol (VENTOLIN HFA) 108 (90 Base) MCG/ACT inhaler Inhale 1-2 puffs into the lungs every 6 (six) hours as needed for wheezing or shortness of breath. 01/30/23   Kozlow, Alvira Philips, MD  amLODipine (NORVASC) 2.5 MG tablet Take 1 tablet (2.5 mg total) by mouth 2 (two) times daily. Patient taking differently: Take 2.5 mg by mouth daily. 01/24/23   Kathleene Hazel, MD  ascorbic acid (VITAMIN C) 500 MG tablet Take 500 mg by mouth daily.    [provider]  cholecalciferol (VITAMIN D3) 25 MCG (1000 UNIT) tablet Take 1,000 Units by mouth daily.    [provider]  dicyclomine (BENTYL) 20 MG tablet Take 1 tablet (20 mg total) by mouth 2 (two) times daily. 05/21/23   Sloan Leiter, DO  EPINEPHrine 0.3 mg/0.3 mL IJ SOAJ injection Inject 0.3 mg into the muscle as needed for anaphylaxis. 11/08/22   Alfonse Spruce, MD  famotidine (PEPCID) 20 MG tablet Take 20 mg by mouth daily. 12/29/22   [provider]  fluticasone (FLONASE) 50 MCG/ACT nasal spray Place 1 spray into both nostrils as needed for allergies.    [provider]  guaiFENesin (MUCINEX) 600 MG 12 hr tablet Take by mouth as needed.     [provider]  levocetirizine (XYZAL) 5 MG tablet Take 1 tablet (5 mg total) by mouth every evening. Patient taking differently: Take 5 mg by mouth daily as needed for allergies. 10/17/22   Alfonse Spruce, MD  ondansetron (ZOFRAN) 4 MG tablet Take 1 tablet (4 mg total) by mouth every 4 (four) hours as needed for nausea or vomiting. 05/21/23   Sloan Leiter, DO  pantoprazole (PROTONIX) 20 MG tablet Take 1 tablet (20 mg total) by mouth daily. 06/28/23   Marisa Cyphers, MD      Allergies    Codeine, Amoxicillin, Amoxicillin-pot clavulanate, Bee venom, Elemental sulfur, Latex, Metoprolol, Milk-related compounds, Shellfish allergy, Sulfa antibiotics, and Sulfamethoxazole  Review of Systems   Review of Systems  Physical Exam Updated Vital Signs BP 128/84 (BP Location: Right Arm)   Pulse 71   Temp 97.6 F (36.4 C)   Resp 18   LMP 11/16/2009   SpO2 94%  Physical Exam Vitals and nursing note reviewed.  Constitutional:      Appearance: She is well-developed.  HENT:     Head: Normocephalic and atraumatic.     Right Ear: Tympanic membrane, ear canal and external ear normal.     Left Ear: Tympanic membrane, ear canal and external ear normal.     Ears:     Comments: Hearing aids, TMs are normal     Nose: Nose normal.     Mouth/Throat:     Pharynx: Uvula midline.  Eyes:     General: Lids are normal.     Extraocular Movements:     Right eye: No nystagmus.     Left eye: No nystagmus.     Conjunctiva/sclera: Conjunctivae normal.     Pupils: Pupils are equal, round, and reactive to light.     Comments: No nystagmus noted  Cardiovascular:     Rate and Rhythm: Normal rate and regular rhythm.  Pulmonary:     Effort: Pulmonary effort is normal.     Breath sounds: Normal breath sounds.  Abdominal:     Palpations: Abdomen is soft.     Tenderness: There is no abdominal tenderness.  Musculoskeletal:     Cervical back: Normal range of motion and neck supple. No tenderness or bony tenderness.  Skin:    General: Skin is warm and dry.  Neurological:     Mental Status: She is alert and oriented to person, place, and time.     GCS: GCS eye subscore is 4. GCS verbal subscore is 5. GCS motor subscore is 6.     Cranial Nerves: No cranial nerve deficit.     Sensory: No sensory deficit.     Motor: No weakness.     Coordination: Coordination normal.     Comments: Upper extremity myotomes tested bilaterally:  C5 Shoulder abduction 5/5 C6 Elbow flexion/wrist extension 5/5 C7 Elbow extension 5/5 C8 Finger flexion 5/5 T1 Finger abduction 5/5  Lower extremity myotomes tested bilaterally: L2 Hip flexion 5/5 L3 Knee extension 5/5 L4 Ankle dorsiflexion 5/5 S1 Ankle plantar flexion 5/5      ED Results / Procedures / Treatments   Labs (all labs ordered are listed, but only abnormal results are displayed) Labs Reviewed  BASIC METABOLIC PANEL WITH GFR - Abnormal; Notable for the following components:      Result Value   Creatinine, Ser 1.05 (*)    GFR, Estimated 58 (*)    All other components within normal limits  CBC - Abnormal; Notable for the following components:   MCV 79.1 (*)    All other components within normal limits  URINALYSIS, ROUTINE W REFLEX MICROSCOPIC - Abnormal; Notable  for the following components:   Color, Urine COLORLESS (*)    Hgb urine dipstick TRACE (*)    All other components within normal limits  CBG MONITORING, ED  CBG MONITORING, ED    EKG EKG Interpretation Date/Time:  Tuesday August 01 2023 11:52:51 EDT Ventricular Rate:  67 PR Interval:  136 QRS Duration:  82 QT Interval:  414 QTC Calculation: 437 R Axis:   56  Text Interpretation: Normal sinus rhythm Possible Anterior infarct (cited on or before 20-May-2023) Abnormal ECG When compared with ECG of  28-Jun-2023 08:27, Nonspecific T wave abnormality no longer evident in Inferior leads Confirmed by Rosealee Concha ([id number]) on 08/01/2023 12:40:04 PM  Radiology CT Head Wo Contrast Result Date: 08/01/2023 CLINICAL DATA:  Syncope/presyncope. Cerebrovascular cause suspected. EXAM: CT HEAD WITHOUT CONTRAST TECHNIQUE: Contiguous axial images were obtained from the base of the skull through the vertex without intravenous contrast. RADIATION DOSE REDUCTION: This exam was performed according to the departmental dose-optimization program which includes automated exposure control, adjustment of the mA and/or kV according to patient size and/or use of iterative reconstruction technique. COMPARISON:  09/19/2022.  08/08/2022. FINDINGS: Brain: No sign of acute stroke. Mild chronic small-vessel ischemic change of the cerebral hemispheric white matter. No cortical or large vessel territory stroke. No mass, hemorrhage, hydrocephalus or extra-axial collection. Vascular: Negative Skull: No significant finding. Sinuses/Orbits: Clear/normal.  Previous FESS. Other: None IMPRESSION: No acute CT finding. Mild chronic small-vessel ischemic change of the cerebral hemispheric white matter. Electronically Signed   By: Bettylou Brunner M.D.   On: 08/01/2023 14:35    Procedures Procedures    Medications Ordered in ED Medications  sodium chloride 0.9 % bolus 500 mL (0 mLs Intravenous Stopped 08/01/23 1345)  metoCLOPramide (REGLAN)  injection 5 mg (5 mg Intravenous Given 08/01/23 1303)  diphenhydrAMINE (BENADRYL) injection 12.5 mg (12.5 mg Intravenous Given 08/01/23 1303)  iohexol (OMNIPAQUE) 350 MG/ML injection 100 mL (100 mLs Intravenous Contrast Given 08/01/23 1646)    ED Course/ Medical Decision Making/ A&P    Patient seen and examined. History obtained directly from patient.  Reviewed previous neurology notes and outpatient imaging.  Labs/EKG: CBC, BMP, UA.  EKG personally reviewed and interpreted as above  Imaging: Ordered CT head  Medications/Fluids: Ordered: Reglan/Benadryl, fluid bolus  Most recent vital signs reviewed and are as follows: BP 128/84 (BP Location: Right Arm)   Pulse 71   Temp 97.6 F (36.4 C)   Resp 18   LMP 11/16/2009   SpO2 94%   Initial impression: Head pressure and lightheadedness, worsening.  No focal neuro deficits on exam.  4:49 PM Reassessment performed. Patient appears stable.  Patient discussed with and seen earlier by Dr. Adrain Alar. We discussed whether or not additional imaging would be warranted.  We reviewed previous neuro notes and given questionable findings on previous imaging, discussed that CTA may be reasonable.  Labs personally reviewed and interpreted including: CBC with normal white blood cell count and hemoglobin; BMP mildly elevated creatinine at 1.05 otherwise unremarkable; UA negative for infection.  Imaging personally visualized and interpreted including: CT head, gree negative for acute findings.  Reviewed pertinent lab work and imaging with patient at bedside. Questions answered.   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 MRA neck was normal.   Discussed possible options regarding how to proceed with patient.  Currently she is still having some symptoms although she has marginally improved.  We discussed discharge with close outpatient follow-up given overall reassuring findings today.  We also discussed obtaining CTA of the head and neck to evaluate for any new changes and reassess the findings on previous  CTA.  After discussion, patient would like to have additional imaging.  CTA head and neck ordered.  6:30 PM Reassessment performed. Patient appears comfortable.  Patient ambulatory in the hallway without any difficulty or need for assistance.  Imaging personally visualized and interpreted including: CTA of the head and neck, does not show any evidence of dissection, aneurysm, stenosis.  Patient does have some diminutive blood vessels, seen previously on imaging, but per report nothing about these seems to indicate stenosis or an acute blockage.  Reviewed pertinent lab work and imaging with patient at bedside. Questions answered.   Most current vital signs reviewed and are as follows: BP 129/80   Pulse 67   Temp 98 F (36.7 C)   Resp 18   LMP 11/16/2009   SpO2 100%   Plan: Discharge to home.   Prescriptions written for: None  Other home care instructions discussed: Close monitoring of symptoms   ED return instructions discussed: Patient counseled to return if they have weakness in their arms or legs, slurred speech, trouble walking or talking, confusion, worsening trouble with their balance, or if they have any other concerns. Patient verbalizes understanding and agrees with plan.   Follow-up instructions discussed: Patient encouraged to follow-up with their PCP in 3 days.                                 Medical Decision Making Amount and/or Complexity of Data Reviewed Labs: ordered. Radiology: ordered.  Risk Prescription drug  management.   Patient presents today with nonspecific dizziness and lightheadedness.  No full syncope.  No chest pain or shortness of breath.  Patient has been seen by neurology and PCP over the past year for similar symptoms.  She has had extensive workup that did not give a definitive reason for the symptoms, but no stroke identified.  Symptoms today seem similar to previous, however more pronounced over the past few days.  Workup today with reassuring basic labs, reassuring noncontrast head CT, no significant findings on CT angio of the head and neck.  In regards to the patient's head symptoms, critical differentials were considered including subarachnoid hemorrhage, intracerebral hemorrhage, epidural/subdural hematoma, pituitary apoplexy, vertebral/carotid artery dissection, giant cell arteritis, central venous thrombosis, reversible cerebral vasoconstriction, acute angle closure glaucoma, idiopathic intracranial hypertension, bacterial meningitis, viral encephalitis, carbon monoxide poisoning, posterior reversible encephalopathy syndrome, pre-eclampsia.   Reg flag symptoms related to these causes were considered including systemic symptoms (fever, weight loss), neurologic symptoms (confusion, mental status change, vision change, associated seizure), acute or sudden "thunderclap" onset, patient age 40 or older with new or progressive headache, patient of any age with first headache or change in headache pattern, pregnant or postpartum status, history of HIV or other immunocompromise, history of cancer, headache occurring with exertion, associated neck or shoulder pain, associated traumatic injury, concurrent use of anticoagulation, family history of spontaneous SAH, and concurrent drug use.    Other benign, more common causes of headache were considered including migraine, tension-type headache, cluster headache, referred pain from other cause such as sinus infection, dental pain, trigeminal neuralgia.    On exam, patient has a reassuring neuro exam including baseline mental status, no significant neck pain or meningeal signs, no signs of severe infection or fever.   The patient's vital signs, pertinent lab work and imaging were reviewed and interpreted as discussed in the ED course. Hospitalization was considered for further testing, treatments, or serial exams/observation. However as  patient is well-appearing, has a stable exam over the course of their evaluation, and reassuring studies today, I do not feel that they warrant admission at this time. This plan was discussed with the patient who verbalizes agreement and comfort with this plan and seems reliable and able to return to the Emergency Department with worsening or changing symptoms.          Final Clinical Impression(s) / ED Diagnoses Final diagnoses:  Lightheadedness  Pressure in head    Rx / DC Orders ED Discharge Orders     None         Lyna Sandhoff, PA-C 08/01/23 1835    Rosealee Concha, MD 08/02/23 1335

## 2023-08-01 NOTE — ED Notes (Signed)
 Patient transported to CT

## 2023-08-01 NOTE — ED Triage Notes (Signed)
 Light headedness and pressure pain on top left side of head Woke up with symptom When to bed around 11:30pm without symptoms  Similar episode before

## 2023-08-01 NOTE — ED Notes (Signed)
 Patient notified of need for urine sample to complete eval/assessment. Specimen cup provided and instructions for clean catch given. Patient will notify staff when able to provide

## 2023-08-01 NOTE — Discharge Instructions (Addendum)
 Please read and follow all provided instructions.  Your diagnoses today include:  1. Lightheadedness   2. Pressure in head     Tests performed today include: CT of your head which was normal and did not show any serious cause of your symptoms CT angio of the head and neck to look at the blood vessels Complete blood cell count: No concerns Basic metabolic panel: No concerns Urinalysis (urine test): Negative for infection Vital signs. See below for your results today.   Medications:  In the Emergency Department you received: Reglan - antinausea/headache medication Benadryl - antihistamine to counteract potential side effects of reglan  Take any prescribed medications only as directed.  Additional information:  Follow any educational materials contained in this packet.  Follow-up instructions: Please follow-up with your primary care provider in the next 3 days for further evaluation of your symptoms.   Return instructions:  Please return to the Emergency Department if you experience worsening symptoms. Return if the medications do not resolve your headache, if it recurs, or if you have multiple episodes of vomiting or cannot keep down fluids. Return if you have a change from the usual headache. RETURN IMMEDIATELY IF you: Develop a sudden, severe headache Develop confusion or become poorly responsive or faint Develop a fever above 100.58F or problem breathing Have a change in speech, vision, swallowing, or understanding Develop new weakness, numbness, tingling, incoordination in your arms or legs Have a seizure Please return if you have any other emergent concerns.  Additional Information:  Your vital signs today were: BP 124/69   Pulse 60   Temp 98 F (36.7 C)   Resp 18   LMP 11/16/2009   SpO2 100%  If your blood pressure (BP) was elevated above 135/85 this visit, please have this repeated by your doctor within one month. --------------

## 2023-08-08 DIAGNOSIS — K219 Gastro-esophageal reflux disease without esophagitis: Secondary | ICD-10-CM | POA: Diagnosis not present

## 2023-08-08 DIAGNOSIS — I10 Essential (primary) hypertension: Secondary | ICD-10-CM | POA: Diagnosis not present

## 2023-08-08 DIAGNOSIS — F411 Generalized anxiety disorder: Secondary | ICD-10-CM | POA: Diagnosis not present

## 2023-08-11 ENCOUNTER — Encounter: Payer: Self-pay | Admitting: Cardiovascular Disease

## 2023-08-14 ENCOUNTER — Encounter: Payer: Self-pay | Admitting: Allergy and Immunology

## 2023-08-14 DIAGNOSIS — R300 Dysuria: Secondary | ICD-10-CM | POA: Diagnosis not present

## 2023-08-14 DIAGNOSIS — R350 Frequency of micturition: Secondary | ICD-10-CM | POA: Diagnosis not present

## 2023-08-14 DIAGNOSIS — R3982 Chronic bladder pain: Secondary | ICD-10-CM | POA: Diagnosis not present

## 2023-08-14 DIAGNOSIS — R311 Benign essential microscopic hematuria: Secondary | ICD-10-CM | POA: Diagnosis not present

## 2023-08-16 DIAGNOSIS — F411 Generalized anxiety disorder: Secondary | ICD-10-CM | POA: Diagnosis not present

## 2023-08-17 ENCOUNTER — Ambulatory Visit
Admission: RE | Admit: 2023-08-17 | Discharge: 2023-08-17 | Disposition: A | Discharge status: Home / Self Care | Source: Ambulatory Visit | Attending: Family Medicine | Admitting: Family Medicine

## 2023-08-17 VITALS — BP 117/83 | HR 61 | Temp 98.4°F | Resp 16

## 2023-08-17 DIAGNOSIS — R42 Dizziness and giddiness: Secondary | ICD-10-CM | POA: Diagnosis not present

## 2023-08-17 DIAGNOSIS — R519 Headache, unspecified: Secondary | ICD-10-CM | POA: Diagnosis not present

## 2023-08-17 DIAGNOSIS — Z711 Person with feared health complaint in whom no diagnosis is made: Secondary | ICD-10-CM

## 2023-08-17 MED ORDER — LEVOCETIRIZINE DIHYDROCHLORIDE 5 MG PO TABS: 5.0000 mg | ORAL_TABLET | Freq: Every evening | ORAL | 0 refills | Status: DC

## 2023-08-17 NOTE — Discharge Instructions (Addendum)
 Start taking Xyzal  daily to address your symptoms for possible allergies. Please follow up with your regular doctor for a recheck and continued work up.

## 2023-08-17 NOTE — ED Triage Notes (Signed)
 Pt c/o pressure to top of head, dizziness and tingling to left fingers-woke ~6am with c/o-states she had  pressure to "left side of my head last night"-states she did have all the same sx last week-did not seek medical care last week=NAD-steady gait

## 2023-08-17 NOTE — ED Provider Notes (Signed)
 [facility]  Note:  This document was prepared using Conservation officer, historic buildings and may include unintentional dictation errors.  MRN: [id number] DOB: 23-Jan-1956  Subjective:   Bianca [name] is a 68 y.o. female presenting for acute onset this morning of swelling and pressure over the top of her head with associated dizziness and tingling in the left pinky finger.  Patient has had these symptoms over the past month.  Has been seen through the emergency room and had extensive workup.  She followed up with her PCP and reports that there was not any definitive direction with respect to her symptoms.  She does report that they occur randomly and intermittently.  Also has concerns that she is sensitive to sodium.  Reports that if she eats anything with a little bit of sodium, her pressure can increase.  She does not endorse any neck pain, confusion, speech disturbance, weakness of an extremity, chest pain, shortness of breath.  She does have allergies but does not take Xyzal  consistently.  Has taken it twice in the past month.  Has a history of migraines but no active headache.  No smoking of any kind including cigarettes, cigars, vaping, marijuana use.  No drug use.  No alcohol use.  No current facility-administered medications for this encounter.  Current Outpatient Medications:    acetaminophen  (TYLENOL ) 500 MG tablet, Take 500 mg by mouth every 6 (six) hours as needed for mild pain (pain score 1-3)., Disp: , Rfl:    albuterol  (VENTOLIN  HFA) 108 (90 Base) MCG/ACT inhaler, Inhale 1-2 puffs into the lungs every 6 (six) hours as needed for wheezing or shortness of breath., Disp: 18 g, Rfl: 2   amLODipine  (NORVASC ) 2.5 MG tablet, Take 1 tablet (2.5 mg total) by mouth 2 (two) times daily. (Patient taking differently: Take 2.5 mg by mouth daily.), Disp: 180 tablet, Rfl: 3   ascorbic acid (VITAMIN C) 500 MG tablet, Take 500 mg by mouth daily., Disp: , Rfl:    cholecalciferol  (VITAMIN D3) 25 MCG (1000 UNIT) tablet, Take 1,000 Units by mouth daily., Disp: , Rfl:    dicyclomine  (BENTYL ) 20 MG tablet, Take 1 tablet (20 mg total) by mouth 2 (two) times daily., Disp: 20 tablet, Rfl: 0   EPINEPHrine  0.3 mg/0.3 mL IJ SOAJ injection, Inject 0.3 mg into the muscle as needed for anaphylaxis., Disp: 0.3 mL, Rfl: 2   famotidine (PEPCID) 20 MG tablet, Take 20 mg by mouth daily., Disp: , Rfl:    fluticasone  (FLONASE) 50 MCG/ACT nasal spray, Place 1 spray into both nostrils as needed for allergies., Disp: , Rfl:    guaiFENesin (MUCINEX) 600 MG 12 hr tablet, Take by mouth as needed. , Disp: , Rfl:    levocetirizine (XYZAL ) 5 MG tablet, Take 1 tablet (5 mg total) by mouth every evening. (Patient taking differently: Take 5 mg by mouth daily as needed for allergies.), Disp: 30 tablet, Rfl: 5   ondansetron  (ZOFRAN ) 4 MG tablet, Take 1 tablet (4 mg total) by mouth every 4 (four) hours as needed for nausea or vomiting., Disp: 12 tablet, Rfl: 0   pantoprazole  (PROTONIX ) 20 MG tablet, Take 1 tablet (20 mg total) by mouth daily., Disp: 14 tablet, Rfl: 0   Allergies  Allergen Reactions   Codeine Other (See Comments)    Other reaction(s): Other (See Comments) Pounding in head  Pounding in head    Amoxicillin Other (See Comments)    Diarrhea and vomiting.  Has patient had  a PCN reaction causing immediate rash, facial/tongue/throat swelling, SOB or lightheadedness with hypotension: No Has patient had a PCN reaction causing severe rash involving mucus membranes or skin necrosis: No Has patient had a PCN reaction that required hospitalization No Has patient had a PCN reaction occurring within the last 10 years: No If all of the above answers are "NO", then may proceed with Cephalosporin use.    Amoxicillin-Pot Clavulanate Other (See Comments)    Diarrhea and vomiting.    Bee Venom     Swelling   Elemental Sulfur Itching   Latex Hives and Other (See Comments)   Metoprolol  Other (See  Comments)    Hair loss   Milk-Related Compounds     Diarrhea & gas   Shellfish Allergy      HIVES, SWELLING, N/V & DIARREHA   Sulfa Antibiotics Itching   Sulfamethoxazole Other (See Comments)    Itching    Past Medical History:  Diagnosis Date   Allergy     Anemia    Anxiety    Asthma    Eczema    H/O exercise stress test 2008; October 2014   was normal in 2008; positive for ischemia with inferior and lateral ST depressions October 2014   Hx of echocardiogram 091/2012   relatively normal as well. No significant valvar lesions. Normal Ef.   Hypertension    Lower extremity edema 05/18/2021   PRN Furosemide    Migraines    Palpitations      Past Surgical History:  Procedure Laterality Date   ABDOMINAL EXPLORATION SURGERY  1980   CARDIAC CATHETERIZATION  04/2010   with no evidence of ischemia or significant coronary disease to speak of, normal LV function with relatively normal EDP.   LEFT HEART CATHETERIZATION WITH CORONARY ANGIOGRAM N/A 03/18/2013   Procedure: LEFT HEART CATHETERIZATION WITH CORONARY ANGIOGRAM;  Surgeon: Odie Benne, MD;  Location: [facility] CATH LAB;  Service: Cardiovascular;  Laterality: N/A;   NASAL SINUS SURGERY      Family History  Problem Relation Age of Onset   Hypertension Mother    Asthma Mother    Anemia Mother    Hypertension Father    Stroke Father    Hypertension Sister    Atrial fibrillation Brother    Stroke Brother    Hypertension Brother    Stroke Maternal Grandmother    Diabetes Maternal Grandmother    Cancer Maternal Grandfather    Diabetes Paternal Grandmother    Heart attack Paternal Grandfather    Cancer Paternal Grandfather    Diabetes Paternal Grandfather    Hypertension Other    Colon cancer Neg Hx    Esophageal cancer Neg Hx    Inflammatory bowel disease Neg Hx    Liver disease Neg Hx    Pancreatic cancer Neg Hx    Rectal cancer Neg Hx     Social History   Tobacco Use   Smoking status: Never    Passive  exposure: Never   Smokeless tobacco: Never  Vaping Use   Vaping status: Never Used  Substance Use Topics   Alcohol use: No    Alcohol/week: 0.0 standard drinks of alcohol   Drug use: No    ROS   Objective:   Vitals: BP 117/83 (BP Location: Left Arm)   Pulse 61   Temp 98.4 F (36.9 C) (Oral)   Resp 16   LMP 11/16/2009   SpO2 98%   Physical Exam Constitutional:      General: She is not in acute  distress.    Appearance: Normal appearance. She is well-developed and normal weight. She is not ill-appearing, toxic-appearing or diaphoretic.  HENT:     Head: Normocephalic and atraumatic.     Right Ear: Tympanic membrane, ear canal and external ear normal. No drainage or tenderness. No middle ear effusion. There is no impacted cerumen. Tympanic membrane is not erythematous or bulging.     Left Ear: Tympanic membrane, ear canal and external ear normal. No drainage or tenderness.  No middle ear effusion. There is no impacted cerumen. Tympanic membrane is not erythematous or bulging.     Nose: Nose normal. No congestion or rhinorrhea.     Mouth/Throat:     Mouth: Mucous membranes are moist. No oral lesions.     Pharynx: No pharyngeal swelling, oropharyngeal exudate, posterior oropharyngeal erythema or uvula swelling.     Tonsils: No tonsillar exudate or tonsillar abscesses.  Eyes:     General: No scleral icterus.       Right eye: No discharge.        Left eye: No discharge.     Extraocular Movements: Extraocular movements intact.     Right eye: Normal extraocular motion.     Left eye: Normal extraocular motion.     Conjunctiva/sclera: Conjunctivae normal.  Neck:     Meningeal: Brudzinski's sign and Kernig's sign absent.  Cardiovascular:     Rate and Rhythm: Normal rate.  Pulmonary:     Effort: Pulmonary effort is normal.  Musculoskeletal:     Cervical back: Normal range of motion and neck supple. No swelling, edema, deformity, erythema, signs of trauma, lacerations, rigidity,  spasms, torticollis, tenderness, bony tenderness or crepitus. No pain with movement, spinous process tenderness or muscular tenderness. Normal range of motion.  Lymphadenopathy:     Cervical: No cervical adenopathy.  Skin:    General: Skin is warm and dry.  Neurological:     General: No focal deficit present.     Mental Status: She is alert and oriented to person, place, and time.     Cranial Nerves: No cranial nerve deficit, dysarthria or facial asymmetry.     Motor: No weakness or pronator drift.     Coordination: Romberg sign negative. Coordination normal. Finger-Nose-Finger Test and Heel to [facility] Test normal. Rapid alternating movements normal.     Gait: Gait and tandem walk normal.     Deep Tendon Reflexes: Reflexes normal.  Psychiatric:        Mood and Affect: Mood normal.        Behavior: Behavior normal.        Thought Content: Thought content normal.        Judgment: Judgment normal.     CT ANGIO HEAD NECK W WO CM Result Date: 08/01/2023 CLINICAL DATA:  Vertigo, lightheadedness, pressure and pain on top left side of head. EXAM: CT ANGIOGRAPHY HEAD AND NECK WITH AND WITHOUT CONTRAST TECHNIQUE: Multidetector CT imaging of the head and neck was performed using the standard protocol during bolus administration of intravenous contrast. Multiplanar CT image reconstructions and MIPs were obtained to evaluate the vascular anatomy. Carotid stenosis measurements (when applicable) are obtained utilizing NASCET criteria, using the distal internal carotid diameter as the denominator. RADIATION DOSE REDUCTION: This exam was performed according to the departmental dose-optimization program which includes automated exposure control, adjustment of the mA and/or kV according to patient size and/or use of iterative reconstruction technique. CONTRAST:  OMNIPAQUE  IOHEXOL  350 MG/ML SOLN COMPARISON:  Same day CT head.  CTA head 09/30/2022. FINDINGS: CTA NECK FINDINGS Aortic arch: Standard configuration  of the aortic arch. Imaged portion shows no evidence of aneurysm or dissection. No significant stenosis of the major arch vessel origins. Pulmonary arteries: As permitted by contrast timing, there are no filling defects in the visualized pulmonary arteries. Subclavian arteries: The subclavian arteries are patent bilaterally. Right carotid system: No evidence of dissection, stenosis (50% or greater), or occlusion. Left carotid system: No evidence of dissection, stenosis (50% or greater), or occlusion. There is mild lateral displacement of the proximal common carotid artery by the enlarged left thyroid  lobe. Vertebral arteries: The left vertebral artery originates on the aortic arch. The right vertebral artery originates on the right subclavian artery. Both vertebral arteries are diminutive but appear similar in caliber. The right vertebral artery is patent to the vertebrobasilar confluence. The left vertebral artery likely terminates at the origin of the left PICA. Skeleton: No acute findings. Degenerative changes in the cervical spine. Disc space narrowing greatest at C6-7. Other neck: The visualized airway is patent. No cervical lymphadenopathy. There is enlargement of the left thyroid  lobe with suggestion of a large nodule which corresponds to findings of prior ultrasound from 2014 which reports a previously benign biopsy. Upper chest: Visualized lung apices are clear. Review of the MIP images confirms the above findings CTA HEAD FINDINGS ANTERIOR CIRCULATION: The intracranial ICAs are patent bilaterally. No significant stenosis, proximal occlusion, aneurysm, or vascular malformation. MCAs: The middle cerebral arteries are patent bilaterally. ACAs: The anterior cerebral arteries are patent bilaterally. POSTERIOR CIRCULATION: PCAs: Patent bilaterally. The right PCA is mostly supplied by the posterior communicating artery with relatively normal sized P1 segment appreciated. There is duplication of the left PCA. The  posterior communicating artery in the normal position supplies the left PCA branch which supplies the more superior aspect of the left PCA territory and receives additional contribution from a diminutive P1 segment. There is an additional left PCA branch originating more distally along the left ICA which supplies the posterior left temporal lobe and inferior left occipital cortex. Pcomm: The posterior communicating arteries are visualized bilaterally. SCAs: Patent bilaterally. Basilar artery: Diminutive appearance of the basilar artery which appears primarily supplied by the right vertebral artery. There is significantly diminished caliber distal to the origin of the AICAs. AICAs: Visualized bilaterally. PICAs: Visualized bilaterally. Vertebral arteries: The intracranial vertebral arteries are patent. Venous sinuses: As permitted by contrast timing, patent. Anatomic variants: Variant anatomy of the posterior circulation as above. Review of the MIP images confirms the above findings IMPRESSION: No large vessel occlusion. No high-grade stenosis, aneurysm, or vascular malformation of the arteries in the head and neck. Similar variant anatomy of the posterior circulation as described above. Electronically Signed   By: Denny Flack M.D.   On: 08/01/2023 18:08   CT Head Wo Contrast Result Date: 08/01/2023 CLINICAL DATA:  Syncope/presyncope. Cerebrovascular cause suspected. EXAM: CT HEAD WITHOUT CONTRAST TECHNIQUE: Contiguous axial images were obtained from the base of the skull through the vertex without intravenous contrast. RADIATION DOSE REDUCTION: This exam was performed according to the departmental dose-optimization program which includes automated exposure control, adjustment of the mA and/or kV according to patient size and/or use of iterative reconstruction technique. COMPARISON:  09/19/2022.  08/08/2022. FINDINGS: Brain: No sign of acute stroke. Mild chronic small-vessel ischemic change of the cerebral  hemispheric white matter. No cortical or large vessel territory stroke. No mass, hemorrhage, hydrocephalus or extra-axial collection. Vascular: Negative Skull: No significant finding. Sinuses/Orbits: Clear/normal.  Previous FESS.  Other: None IMPRESSION: No acute CT finding. Mild chronic small-vessel ischemic change of the cerebral hemispheric white matter. Electronically Signed   By: Bettylou Brunner M.D.   On: 08/01/2023 14:35   CT ABDOMEN PELVIS W CONTRAST Result Date: 05/21/2023 CLINICAL DATA:  Acute, nonlocalized abdominal pain EXAM: CT ABDOMEN AND PELVIS WITH CONTRAST TECHNIQUE: Multidetector CT imaging of the abdomen and pelvis was performed using the standard protocol following bolus administration of intravenous contrast. RADIATION DOSE REDUCTION: This exam was performed according to the departmental dose-optimization program which includes automated exposure control, adjustment of the mA and/or kV according to patient size and/or use of iterative reconstruction technique. CONTRAST:  75mL OMNIPAQUE  IOHEXOL  350 MG/ML SOLN COMPARISON:  01/28/2022 FINDINGS: Lower chest:  No contributory findings. Hepatobiliary: No focal liver abnormality.No evidence of biliary obstruction or stone. Pancreas: Unremarkable. Spleen: Unremarkable. Adrenals/Urinary Tract: Negative adrenals. Stable kidneys compared to renal MRI in 2023, including benign left upper pole cyst measuring 12 mm. No hydronephrosis or stone. Unremarkable bladder. Stomach/Bowel:  No obstruction. No appendicitis. Vascular/Lymphatic: No acute vascular abnormality. No mass or adenopathy. Reproductive:No pathologic findings. Other: No ascites or pneumoperitoneum. Musculoskeletal: No acute abnormalities. Degenerative facet spurring in the lumbar spine. IMPRESSION: No acute finding or explanation for pain. Electronically Signed   By: Ronnette Coke M.D.   On: 05/21/2023 04:36   DG Chest Portable 1 View Result Date: 05/21/2023 CLINICAL DATA:  Dyspnea EXAM:  PORTABLE CHEST 1 VIEW COMPARISON:  04/01/2023 FINDINGS: The heart size and mediastinal contours are within normal limits. Both lungs are clear. The visualized skeletal structures are unremarkable. IMPRESSION: No active disease. Electronically Signed   By: Worthy Heads M.D.   On: 05/21/2023 03:19   Recent Results (from the past 2160 hours)  Urinalysis, Routine w reflex microscopic -Urine, Clean Catch     Status: Abnormal   Collection Time: 05/20/23  9:10 PM  Result Value Ref Range   Color, Urine YELLOW YELLOW   APPearance CLEAR CLEAR   Specific Gravity, Urine 1.015 1.005 - 1.030   pH 5.0 5.0 - 8.0   Glucose, UA NEGATIVE NEGATIVE mg/dL   Hgb urine dipstick MODERATE (A) NEGATIVE   Bilirubin Urine NEGATIVE NEGATIVE   Ketones, ur NEGATIVE NEGATIVE mg/dL   Protein, ur NEGATIVE NEGATIVE mg/dL   Nitrite NEGATIVE NEGATIVE   Leukocytes,Ua NEGATIVE NEGATIVE   RBC / HPF 0-5 0 - 5 RBC/hpf   WBC, UA 0-5 0 - 5 WBC/hpf   Bacteria, UA NONE SEEN NONE SEEN   Squamous Epithelial / HPF 0-5 0 - 5 /HPF    Comment: Performed at [facility], [address] [place], [place] [address]  Lipase, blood     Status: None   Collection Time: 05/20/23  9:37 PM  Result Value Ref Range   Lipase 30 11 - 51 U/L    Comment: Performed at [facility], [address] [place], [place] [address]  Comprehensive metabolic panel     Status: Abnormal   Collection Time: 05/20/23  9:37 PM  Result Value Ref Range   Sodium 139 135 - 145 mmol/L   Potassium 3.6 3.5 - 5.1 mmol/L   Chloride 106 98 - 111 mmol/L   CO2 23 22 - 32 mmol/L   Glucose, Bld 97 70 - 99 mg/dL    Comment: Glucose reference range applies only to samples taken after fasting for at least 8 hours.   BUN 24 (H) 8 - 23 mg/dL   Creatinine, Ser 0.98 0.44 - 1.00  mg/dL   Calcium  10.2 8.9 - 10.3 mg/dL   Total Protein 7.3 6.5 - 8.1 g/dL   Albumin 3.8 3.5 - 5.0 g/dL   AST 27 15 - 41 U/L   ALT 18 0 - 44 U/L   Alkaline Phosphatase 61 38 - 126 U/L   Total  Bilirubin 0.8 0.0 - 1.2 mg/dL   GFR, Estimated >16 >10 mL/min    Comment: (NOTE) Calculated using the CKD-EPI Creatinine Equation (2021)    Anion gap 10 5 - 15    Comment: Performed at [facility], [address] [place], [place] [address]  CBC     Status: Abnormal   Collection Time: 05/20/23  9:37 PM  Result Value Ref Range   WBC 5.9 4.0 - 10.5 K/uL   RBC 4.47 3.87 - 5.11 MIL/uL   Hemoglobin 12.9 12.0 - 15.0 g/dL   HCT 40.9 (L) 81.1 - 91.4 %   MCV 80.1 80.0 - 100.0 fL   MCH 28.9 26.0 - 34.0 pg   MCHC 36.0 30.0 - 36.0 g/dL   RDW 78.2 95.6 - 21.3 %   Platelets 349 150 - 400 K/uL   nRBC 0.0 0.0 - 0.2 %    Comment: Performed at [facility], [address], [place], [place] [address]  Resp panel by RT-PCR (RSV, Flu A&B, Covid) Anterior Nasal Swab     Status: None   Collection Time: 05/21/23  3:09 AM   Specimen: Anterior Nasal Swab  Result Value Ref Range   SARS Coronavirus 2 by RT PCR NEGATIVE NEGATIVE   Influenza A by PCR NEGATIVE NEGATIVE   Influenza B by PCR NEGATIVE NEGATIVE    Comment: (NOTE) The Xpert Xpress SARS-CoV-2/FLU/RSV plus assay is intended as an aid in the diagnosis of influenza from Nasopharyngeal swab specimens and should not be used as a sole basis for treatment. Nasal washings and aspirates are unacceptable for Xpert Xpress SARS-CoV-2/FLU/RSV testing.  Fact Sheet for Patients: BloggerCourse.com  Fact Sheet for Healthcare Providers: SeriousBroker.it  This test is not yet approved or cleared by the [place]  FDA and has been authorized for detection and/or diagnosis of SARS-CoV-2 by FDA under an Emergency Use Authorization (EUA). This EUA will remain in effect (meaning this test can be used) for the duration of the COVID-19 declaration under Section 564(b)(1) of the Act, 21 U.S.C. section 360bbb-3(b)(1), unless the authorization is terminated or revoked.     Resp Syncytial Virus by PCR  NEGATIVE NEGATIVE    Comment: (NOTE) Fact Sheet for Patients: BloggerCourse.com  Fact Sheet for Healthcare Providers: SeriousBroker.it  This test is not yet approved or cleared by the [place]  FDA and has been authorized for detection and/or diagnosis of SARS-CoV-2 by FDA under an Emergency Use Authorization (EUA). This EUA will remain in effect (meaning this test can be used) for the duration of the COVID-19 declaration under Section 564(b)(1) of the Act, 21 U.S.C. section 360bbb-3(b)(1), unless the authorization is terminated or revoked.  Performed at [facility], [address], [place], [place] [address]   Troponin I (High Sensitivity)     Status: None   Collection Time: 05/21/23  3:09 AM  Result Value Ref Range   Troponin I (High Sensitivity) 6 <18 ng/L    Comment: (NOTE) Elevated high sensitivity troponin I (hsTnI) values and significant  changes across serial measurements may suggest ACS but many other  chronic and acute conditions are known to elevate hsTnI results.  Refer to  the "Links" section for chest pain algorithms and additional  guidance. Performed at [facility], [address] [place], [place] [address]   CBG monitoring, ED     Status: None   Collection Time: 08/01/23 11:49 AM  Result Value Ref Range   Glucose-Capillary 85 70 - 99 mg/dL    Comment: Glucose reference range applies only to samples taken after fasting for at least 8 hours.  Basic metabolic panel     Status: Abnormal   Collection Time: 08/01/23 12:01 PM  Result Value Ref Range   Sodium 137 135 - 145 mmol/L   Potassium 4.3 3.5 - 5.1 mmol/L   Chloride 102 98 - 111 mmol/L   CO2 29 22 - 32 mmol/L   Glucose, Bld 86 70 - 99 mg/dL    Comment: Glucose reference range applies only to samples taken after fasting for at least 8 hours.   BUN 20 8 - 23 mg/dL   Creatinine, Ser 0.27 (H) 0.44 - 1.00 mg/dL   Calcium  10.0 8.9 - 10.3 mg/dL    GFR, Estimated 58 (L) >60 mL/min    Comment: (NOTE) Calculated using the CKD-EPI Creatinine Equation (2021)    Anion gap 6 5 - 15    Comment: Performed at [facility], [address], [place], [place] [address]  CBC     Status: Abnormal   Collection Time: 08/01/23 12:01 PM  Result Value Ref Range   WBC 4.8 4.0 - 10.5 K/uL   RBC 4.70 3.87 - 5.11 MIL/uL   Hemoglobin 13.3 12.0 - 15.0 g/dL   HCT 44.0 34.7 - 42.5 %   MCV 79.1 (L) 80.0 - 100.0 fL   MCH 28.3 26.0 - 34.0 pg   MCHC 35.8 30.0 - 36.0 g/dL   RDW 95.6 38.7 - 56.4 %   Platelets 369 150 - 400 K/uL   nRBC 0.0 0.0 - 0.2 %    Comment: Performed at [facility], [address] [place], [place] [address]  Urinalysis, Routine w reflex microscopic -Urine, Clean Catch     Status: Abnormal   Collection Time: 08/01/23  3:00 PM  Result Value Ref Range   Color, Urine COLORLESS (A) YELLOW   APPearance CLEAR CLEAR   Specific Gravity, Urine 1.008 1.005 - 1.030   pH 5.0 5.0 - 8.0   Glucose, UA NEGATIVE NEGATIVE mg/dL   Hgb urine dipstick TRACE (A) NEGATIVE   Bilirubin Urine NEGATIVE NEGATIVE   Ketones, ur NEGATIVE NEGATIVE mg/dL   Protein, ur NEGATIVE NEGATIVE mg/dL   Nitrite NEGATIVE NEGATIVE   Leukocytes,Ua NEGATIVE NEGATIVE   RBC / HPF 0-5 0 - 5 RBC/hpf   WBC, UA 0-5 0 - 5 WBC/hpf   Bacteria, UA NONE SEEN NONE SEEN   Squamous Epithelial / HPF 0-5 0 - 5 /HPF    Comment: Performed at [facility], [address] [place], [place] [address]     Assessment and Plan :   PDMP not reviewed this encounter.  1. Pressure in head   2. Physically well but worried   3. Dizziness    Patient has reassuring physical exam findings, hemodynamically stable vital signs.  I reassured patient after a thorough review of her medical chart obtained from the emergency room.  Restarting Xyzal  is low risk to address allergic rhinitis as a possible source of her symptoms.  Emphasized need for  follow-up with her PCP, consideration for repeat labs versus referral versus a trial of medication to address  possible anxiety.  Counseled patient on potential for adverse effects with medications prescribed/recommended today, ER and return-to-clinic precautions discussed, patient verbalized understanding.    Adolph Hoop, [place] 08/17/23 1154

## 2023-08-18 ENCOUNTER — Encounter: Payer: Self-pay | Admitting: Neurology

## 2023-08-18 NOTE — Telephone Encounter (Signed)
 Patient called and stated that she sent a mychart message regarding her sudden salt sensitivity. Patient stated that she feels pressure on the top of her head and gets lightheaded with small amount of sodium. Patient is requesting a call back at [phone]

## 2023-08-22 DIAGNOSIS — R42 Dizziness and giddiness: Secondary | ICD-10-CM | POA: Diagnosis not present

## 2023-08-22 DIAGNOSIS — R5383 Other fatigue: Secondary | ICD-10-CM | POA: Diagnosis not present

## 2023-08-22 DIAGNOSIS — Z1330 Encounter for screening examination for mental health and behavioral disorders, unspecified: Secondary | ICD-10-CM | POA: Diagnosis not present

## 2023-08-22 DIAGNOSIS — R079 Chest pain, unspecified: Secondary | ICD-10-CM | POA: Diagnosis not present

## 2023-08-23 ENCOUNTER — Emergency Department (HOSPITAL_BASED_OUTPATIENT_CLINIC_OR_DEPARTMENT_OTHER): Admitting: Radiology

## 2023-08-23 ENCOUNTER — Emergency Department (HOSPITAL_BASED_OUTPATIENT_CLINIC_OR_DEPARTMENT_OTHER)
Admission: EM | Admit: 2023-08-23 | Discharge: 2023-08-23 | Disposition: A | Discharge status: Home / Self Care | Attending: Emergency Medicine | Admitting: Emergency Medicine

## 2023-08-23 ENCOUNTER — Other Ambulatory Visit: Payer: Self-pay

## 2023-08-23 ENCOUNTER — Encounter (HOSPITAL_BASED_OUTPATIENT_CLINIC_OR_DEPARTMENT_OTHER): Payer: Self-pay | Admitting: Emergency Medicine

## 2023-08-23 DIAGNOSIS — Z91040 Latex allergy status: Secondary | ICD-10-CM | POA: Insufficient documentation

## 2023-08-23 DIAGNOSIS — R0789 Other chest pain: Secondary | ICD-10-CM | POA: Diagnosis not present

## 2023-08-23 DIAGNOSIS — I10 Essential (primary) hypertension: Secondary | ICD-10-CM | POA: Diagnosis not present

## 2023-08-23 DIAGNOSIS — Z79899 Other long term (current) drug therapy: Secondary | ICD-10-CM | POA: Insufficient documentation

## 2023-08-23 DIAGNOSIS — R079 Chest pain, unspecified: Secondary | ICD-10-CM | POA: Diagnosis not present

## 2023-08-23 DIAGNOSIS — J45909 Unspecified asthma, uncomplicated: Secondary | ICD-10-CM | POA: Diagnosis not present

## 2023-08-23 DIAGNOSIS — R42 Dizziness and giddiness: Secondary | ICD-10-CM | POA: Insufficient documentation

## 2023-08-23 LAB — BASIC METABOLIC PANEL WITH GFR
Anion gap: 11 (ref 5–15)
BUN: 21 mg/dL (ref 8–23)
CO2: 26 mmol/L (ref 22–32)
Calcium: 10.6 mg/dL — ABNORMAL HIGH (ref 8.9–10.3)
Chloride: 101 mmol/L (ref 98–111)
Creatinine, Ser: 1.03 mg/dL — ABNORMAL HIGH (ref 0.44–1.00)
GFR, Estimated: 59 mL/min — ABNORMAL LOW (ref >60)
Glucose, Bld: 101 mg/dL — ABNORMAL HIGH (ref 70–99)
Potassium: 4.2 mmol/L (ref 3.5–5.1)
Sodium: 138 mmol/L (ref 135–145)

## 2023-08-23 LAB — CBC
HCT: 36.5 % (ref 36.0–46.0)
Hemoglobin: 13.3 g/dL (ref 12.0–15.0)
MCH: 29.1 pg (ref 26.0–34.0)
MCHC: 36.4 g/dL — ABNORMAL HIGH (ref 30.0–36.0)
MCV: 79.9 fL — ABNORMAL LOW (ref 80.0–100.0)
Platelets: 321 10*3/uL (ref 150–400)
RBC: 4.57 MIL/uL (ref 3.87–5.11)
RDW: 14.1 % (ref 11.5–15.5)
WBC: 6 10*3/uL (ref 4.0–10.5)
nRBC: 0 % (ref 0.0–0.2)

## 2023-08-23 LAB — CBG MONITORING, ED: Glucose-Capillary: 98 mg/dL (ref 70–99)

## 2023-08-23 LAB — TROPONIN T, HIGH SENSITIVITY
Troponin T High Sensitivity: <15 ng/L (ref <19)
Troponin T High Sensitivity: <15 ng/L (ref <19)

## 2023-08-23 NOTE — ED Provider Notes (Signed)
 [place] EMERGENCY DEPARTMENT AT [facility] Provider Note   CSN: [id number] Arrival date & time: 08/23/23  1656     History {Add pertinent medical, surgical, social history, OB history to HPI:1} Chief Complaint  Patient presents with  . Chest Pain  . Dizziness    Bianca [name] is a 68 y.o. female.  HPI    68 year old female with a history of allergy , anxiety, hypertension, suspicion for coronary vasospasm previously seen by [facility], recent ED visit for lightheadedness, palpitations   Saw cardiology at [facility], Dr. Lavonda Pour yesterday and is scheduled for stress test.  This morning after eating began to feel very lightheaded, continued eating and by the time finished eating began to have nervous feeling in chest and stomach and lightheadedness which lasted 1-2 hours.  At lunch the same thing happened.  After dinner tonight it happened again and was horrible, felt like going to faint.  Has been taking medicine for acid reflux, heart burn. Started having head pressure then stopped. Salt sensitivity. Stopped the reflux medicine Took colace one night, headache and called EMS (when she came here last in April) Things with sodium seem to make head hurt.  Today after eating had lightheadedness and nervous feeling, ate FODMAP diet.  Today every time ate felt lightheaded and difficulty standing.  Now nausea.  Sometimes will have palpitations with the lightheadedness. Took blood sugar was 107. Did have trouble with hypoglycemia in the past 40 years ago saw endocrinologist in [place]   For 3-4 months has had the dizziness, lightheadedness after eating, dizziness has been there for over a year.   Has felt feeling of nervousness when getting on the treadmill, was doing silver sneakers but has not been doing it because of lightheadedness.  Has spent a lot of time and money trying to figure it out, trying to get her life back  Feeling a little better now than she  was earlier today.  Not feeling like near-syncope  No syncope (hx of once a long time ago at a funeral)  January lost weight unintentionally Prior to that also did. Trying to gain weight back.  Has had some lightheadedness with eating, and at first changed diet.  Eating pears for breakfast.   Now seems like everything triggering it Had long term monitor 4/19 2024 4/5 2022 long term monitor.   Cath normal 2012, 2014  Past Medical History:  Diagnosis Date  . Allergy    . Anemia   . Anxiety   . Asthma   . Eczema   . H/O exercise stress test 2008; October 2014   was normal in 2008; positive for ischemia with inferior and lateral ST depressions October 2014  . Hx of echocardiogram 091/2012   relatively normal as well. No significant valvar lesions. Normal Ef.  . Hypertension   . Lower extremity edema 05/18/2021   PRN Furosemide   . Migraines   . Palpitations     Past Surgical History:  Procedure Laterality Date  . ABDOMINAL EXPLORATION SURGERY  1980  . CARDIAC CATHETERIZATION  04/2010   with no evidence of ischemia or significant coronary disease to speak of, normal LV function with relatively normal EDP.  [facility]aron Aas LEFT HEART CATHETERIZATION WITH CORONARY ANGIOGRAM N/A 03/18/2013   Procedure: LEFT HEART CATHETERIZATION WITH CORONARY ANGIOGRAM;  Surgeon: Odie Benne, MD;  Location: [facility] CATH LAB;  Service: Cardiovascular;  Laterality: N/A;  . NASAL SINUS SURGERY       Home Medications Prior to Admission medications  Medication Sig Start Date End Date Taking? Authorizing Provider  acetaminophen  (TYLENOL ) 500 MG tablet Take 500 mg by mouth every 6 (six) hours as needed for mild pain (pain score 1-3).    [provider]  albuterol  (VENTOLIN  HFA) 108 (90 Base) MCG/ACT inhaler Inhale 1-2 puffs into the lungs every 6 (six) hours as needed for wheezing or shortness of breath. 01/30/23   Kozlow, Rema Care, MD  amLODipine  (NORVASC ) 2.5 MG tablet Take 1 tablet (2.5 mg total) by  mouth 2 (two) times daily. Patient taking differently: Take 2.5 mg by mouth daily. 01/24/23   Odie Benne, MD  ascorbic acid (VITAMIN C) 500 MG tablet Take 500 mg by mouth daily.    [provider]  cholecalciferol (VITAMIN D3) 25 MCG (1000 UNIT) tablet Take 1,000 Units by mouth daily.    [provider]  dicyclomine  (BENTYL ) 20 MG tablet Take 1 tablet (20 mg total) by mouth 2 (two) times daily. 05/21/23   Russella Courts A, DO  EPINEPHrine  0.3 mg/0.3 mL IJ SOAJ injection Inject 0.3 mg into the muscle as needed for anaphylaxis. 11/08/22   Rochester Chuck, MD  famotidine (PEPCID) 20 MG tablet Take 20 mg by mouth daily. 12/29/22   [provider]  fluticasone  (FLONASE) 50 MCG/ACT nasal spray Place 1 spray into both nostrils as needed for allergies.    [provider]  guaiFENesin (MUCINEX) 600 MG 12 hr tablet Take by mouth as needed.     [provider]  levocetirizine (XYZAL ) 5 MG tablet Take 1 tablet (5 mg total) by mouth every evening. 08/17/23   Adolph Hoop, PA-C  ondansetron  (ZOFRAN ) 4 MG tablet Take 1 tablet (4 mg total) by mouth every 4 (four) hours as needed for nausea or vomiting. 05/21/23   Russella Courts A, DO  pantoprazole  (PROTONIX ) 20 MG tablet Take 1 tablet (20 mg total) by mouth daily. 06/28/23   Currieo, Andrew D, MD      Allergies    Codeine, Amoxicillin, Amoxicillin-pot clavulanate, Bee venom, Elemental sulfur, Latex, Metoprolol , Milk-related compounds, Shellfish allergy , Sulfa antibiotics, and Sulfamethoxazole    Review of Systems   Review of Systems  Physical Exam Updated Vital Signs BP 120/80 (BP Location: Right Arm)   Pulse 69   Temp 98.1 F (36.7 C)   Resp 16   LMP 11/16/2009   SpO2 100%  Physical Exam  ED Results / Procedures / Treatments   Labs (all labs ordered are listed, but only abnormal results are displayed) Labs Reviewed  CBC - Abnormal; Notable for the following components:      Result Value   MCV  79.9 (*)    MCHC 36.4 (*)    All other components within normal limits  BASIC METABOLIC PANEL WITH GFR  TROPONIN T, HIGH SENSITIVITY    EKG None  Radiology No results found.  Procedures Procedures  {Document cardiac monitor, telemetry assessment procedure when appropriate:1}  Medications Ordered in ED Medications - No data to display  ED Course/ Medical Decision Making/ A&P   {   Click here for ABCD2, HEART and other calculatorsREFRESH Note before signing :1}                               ***  Labs completed and personally evaluated and interpreted by me show creatinine similar to prior.  No anemia or leukocytosis.  No clinically significant electrolyte abnormalities.  Troponin is negative.  Chest x-ray completed and personally eval and interpreted by me shows no evidence of pneumonia, pneumothorax, or pulmonary edema.   {Document critical care time when appropriate:1} {Document review of labs and clinical decision tools ie heart score, Chads2Vasc2 etc:1}  {Document your independent review of radiology images, and any outside records:1} {Document your discussion with family members, caretakers, and with consultants:1} {Document social determinants of health affecting pt's care:1} {Document your decision making why or why not admission, treatments were needed:1} Final Clinical Impression(s) / ED Diagnoses Final diagnoses:  None    Rx / DC Orders ED Discharge Orders     None

## 2023-08-23 NOTE — Discharge Instructions (Addendum)
 Consider postprandial vagal activity as etiology of your symptoms. Follow up with your Cardiologist, Allergist and PCP.

## 2023-08-23 NOTE — ED Notes (Signed)
 AVS given.Questions answered.Discharged ambulatory.

## 2023-08-23 NOTE — ED Notes (Signed)
 Blood sugar was 98

## 2023-08-23 NOTE — ED Triage Notes (Signed)
 C/o increased dizziness and anxiety on two separate occasions today. States both symptoms were worse after eating. Has chest discomfort as well.

## 2023-08-24 ENCOUNTER — Encounter: Payer: Self-pay | Admitting: Allergy & Immunology

## 2023-08-24 ENCOUNTER — Ambulatory Visit: Admitting: Allergy & Immunology

## 2023-08-24 VITALS — BP 106/64 | HR 62 | Temp 98.1°F | Resp 12 | Ht 61.42 in | Wt 137.3 lb

## 2023-08-24 DIAGNOSIS — R1031 Right lower quadrant pain: Secondary | ICD-10-CM | POA: Diagnosis not present

## 2023-08-24 DIAGNOSIS — T7800XD Anaphylactic reaction due to unspecified food, subsequent encounter: Secondary | ICD-10-CM

## 2023-08-24 DIAGNOSIS — T781XXA Other adverse food reactions, not elsewhere classified, initial encounter: Secondary | ICD-10-CM | POA: Diagnosis not present

## 2023-08-24 DIAGNOSIS — T7840XD Allergy, unspecified, subsequent encounter: Secondary | ICD-10-CM

## 2023-08-24 DIAGNOSIS — K2100 Gastro-esophageal reflux disease with esophagitis, without bleeding: Secondary | ICD-10-CM | POA: Diagnosis not present

## 2023-08-24 DIAGNOSIS — I10 Essential (primary) hypertension: Secondary | ICD-10-CM

## 2023-08-24 DIAGNOSIS — F419 Anxiety disorder, unspecified: Secondary | ICD-10-CM | POA: Diagnosis not present

## 2023-08-24 DIAGNOSIS — T782XXD Anaphylactic shock, unspecified, subsequent encounter: Secondary | ICD-10-CM

## 2023-08-24 DIAGNOSIS — J4520 Mild intermittent asthma, uncomplicated: Secondary | ICD-10-CM | POA: Diagnosis not present

## 2023-08-24 DIAGNOSIS — R42 Dizziness and giddiness: Secondary | ICD-10-CM | POA: Diagnosis not present

## 2023-08-24 MED ORDER — ALBUTEROL SULFATE HFA 108 (90 BASE) MCG/ACT IN AERS
1.0000 | INHALATION_SPRAY | Freq: Four times a day (QID) | RESPIRATORY_TRACT | 2 refills | Status: AC | PRN
Start: 2023-08-24 — End: ?

## 2023-08-24 MED ORDER — BUDESONIDE-FORMOTEROL FUMARATE 160-4.5 MCG/ACT IN AERO: 2.0000 | INHALATION_SPRAY | Freq: Two times a day (BID) | RESPIRATORY_TRACT | 1 refills | Status: AC

## 2023-08-24 MED ORDER — LORATADINE 10 MG PO TABS: 10.0000 mg | ORAL_TABLET | Freq: Every day | ORAL | 3 refills | Status: DC | PRN

## 2023-08-24 NOTE — Progress Notes (Signed)
 FOLLOW UP  Date of Service/Encounter:  08/24/23   Assessment:   Allergic reactions to multiple medications - mast cell work up negative (confirmed with a referral to [facility])    Food allergies (shellfish, wheat, rice, red meats, avocado, potato) - negative testing (consider adding on Xolair with IgE of 57 in June 2024)   Preference for holistic treatment options - recommended [facility]   Complex past medical history including sensorineural hearing loss, hypertension, coronary heart disease, and abdominal pain  Plan/Recommendations:   1. Mild persistent asthma, uncomplicated - Lung testing not done today. - I think that you are doing well from a breathing perspective.  - Daily controller medication(s): NOTHING  - Prior to physical activity: albuterol  2 puffs 10-15 minutes before physical activity. - Rescue medications: DuoNeb nebulizer one vial every 4-6 hours as needed - Changes during respiratory infections or worsening symptoms: Add on Symbicort  1-2 puffs twice daily for TWO WEEKS. - Asthma control goals:  * Full participation in all desired activities (may need albuterol  before activity) * Albuterol  use two time or less a week on average (not counting use with activity) * Cough interfering with sleep two time or less a month * Oral steroids no more than once a year * No hospitalizations  2. Perennial allergic rhinitis - Continue with as needed nose sprays.   3. Concern for food intolerance - Salt is know to cause high blood pressure (by causing retention of fluid in your blood vessels).  - I agree with holding off on the amlodipine  to see how this works. - Your blood pressure is low today, which can cause light headedness.  - I think you might benefit from seeing the integrative folks at: http://[facility].org/ - You can call [phone] to make an appointment or talk to them about anticipated costs.  - I am not sure about whether  they accept insurances or not.  - You can also look into Everlywell testing which you can purchase online (notably Amazon and other places).  - This testing is not validated, so we do not do it at all.  - But there are some patients that find it helpful.  - You can try taking CLARITIN (loratadine) daily to see if this helps.  - Claritin has the least amount of side effects.   4. Return in about 1 year (around 08/23/2024). You can have the follow up appointment with Dr. Idolina Maker or a Nurse Practicioner (our Nurse Practitioners are excellent and always have Physician oversight!).    Subjective:   Bianca [name] is a 68 y.o. female presenting today for follow up of  Chief Complaint  Patient presents with   Asthma    Says she is doing well. No issues or concerns at this time.    Allergic Rhinitis     Says she is having some issues with post nasal drip.    Bianca [name] has a history of the following: Patient Active Problem List   Diagnosis Date Noted   Coronary vasospasm (HCC) 05/18/2021   Premature atrial contractions 05/18/2021   Lower extremity edema 05/18/2021   Hemorrhoids 05/18/2021   Rectal pain 03/23/2021   Change in bowel habits 03/23/2021   RLQ abdominal pain 03/23/2021   Right flank pain 03/23/2021   Acute midline thoracic back pain 03/23/2021   Chest pain 03/13/2013   Abnormal stress ECG with treadmill 02/24/2013   HTN (hypertension) 12/24/2011   Heart palpitations 12/24/2011   Awareness of heartbeats 11/30/2011  History obtained from: chart review and patient.  Discussed the use of AI scribe software for clinical note transcription with the patient and/or guardian, who gave verbal consent to proceed.  Bianca [name] is a 68 y.o. female presenting for a follow up visit. She was last seen in January 2025.  At that time, like testing was not done.  She was doing well with albuterol  as needed and DuoNebs added during flares.  She has Symbicort  that she has when she is  sick as well.  For her allergic rhinitis, we continue with as needed nasal sprays.  We recommended that she see [facility] because she had a lot of concerns with food sensitivities and reactions which were not allergic in nature at all.  Since the last visit, she has had some postnasal drip that has worsened as of late.   She has experienced a sudden increase in sensitivity to salt, which she notes causes her blood pressure to rise. She avoids adding salt to her food and steers clear of canned and processed foods. She suspects that medications like Pepcid, Prabisamol, and Colace, which contain sodium, may be contributing to this issue. She has stopped taking these medications due to the blood pressure increase. She recalls an episode where her blood pressure reached 144/98 after taking 50 mg of Colace, accompanied by a sensation of pressure and a 'knot' on the left side of her head, and tingling in her little fingers.  She also reports increased sensitivity to various foods, leading to lightheadedness and diarrhea. Foods that previously did not cause issues, such as eggs, organic baked chicken, carrots, and sweet potatoes, now result in these symptoms. She describes an episode where eating eggs led to lightheadedness and staggering, followed by diarrhea. She has also experienced a sensation of a small lump in the back of her throat after eating pears, which she has been consuming for regularity. Despite these symptoms, she continues to eat as she needs to maintain her nutrition.  She has lost 12 pounds since January, despite not trying to lose weight, and has been attempting to gain weight after previously losing 30 pounds due to stomach issues. She has a limited number of 'safe foods' and notes that even these are now causing issues.  She takes amlodipine , half of a 2.5 mg pill, for blood pressure management. She notes that her blood pressure reading was 106/64, which she considers  low.  She has tried digestive enzymes once, which did not seem to help and coincided with a high blood pressure episode. No hives, itching, or vomiting, but she did have diarrhea yesterday and a little this morning. No blood in the diarrhea. She recently experienced stomach upset after eating tilapia, which she prepared herself to ensure no additional seasonings were used.   She has a history of a severe reaction to shrimp around 10+ years ago, which caused full body hives and diarrhea, and has since avoided seafood. We have tested in the past, but she has been negative. She is not interested in an oral challenge. She is open to doing Xolair for management of food allergies.     Otherwise, there have been no changes to her past medical history, surgical history, family history, or social history.    Review of systems otherwise negative other than that mentioned in the HPI.    Objective:   Blood pressure 106/64, pulse 62, temperature 98.1 F (36.7 C), temperature source Temporal, resp. rate 12, height 5' 1.42" (1.56 m), weight 137  lb 4.8 oz (62.3 kg), last menstrual period 11/16/2009, SpO2 100%. Body mass index is 25.59 kg/m.     Physical Exam Vitals reviewed.  Constitutional:      Appearance: She is well-developed.     Comments: Talkative.   HENT:     Head: Normocephalic and atraumatic.     Right Ear: Tympanic membrane, ear canal and external ear normal. No drainage, swelling or tenderness. Tympanic membrane is not injected, scarred, erythematous, retracted or bulging.     Left Ear: Tympanic membrane, ear canal and external ear normal. No drainage, swelling or tenderness. Tympanic membrane is not injected, scarred, erythematous, retracted or bulging.     Nose: No nasal deformity, septal deviation, mucosal edema or rhinorrhea.     Right Turbinates: Enlarged, swollen and pale.     Left Turbinates: Enlarged, swollen and pale.     Right Sinus: No maxillary sinus tenderness or frontal  sinus tenderness.     Left Sinus: No maxillary sinus tenderness or frontal sinus tenderness.     Mouth/Throat:     Mouth: Mucous membranes are not pale and not dry.     Pharynx: Uvula midline.  Eyes:     General:        Right eye: No discharge.        Left eye: No discharge.     Conjunctiva/sclera: Conjunctivae normal.     Right eye: Right conjunctiva is not injected. No chemosis.    Left eye: Left conjunctiva is not injected. No chemosis.    Pupils: Pupils are equal, round, and reactive to light.  Cardiovascular:     Rate and Rhythm: Normal rate and regular rhythm.     Heart sounds: Normal heart sounds.  Pulmonary:     Effort: Pulmonary effort is normal. No tachypnea, accessory muscle usage or respiratory distress.     Breath sounds: Normal breath sounds. No wheezing, rhonchi or rales.     Comments: Moving air well in all lung fields. No increased work of breathing noted.  Chest:     Chest wall: No tenderness.  Abdominal:     Tenderness: There is no abdominal tenderness. There is no guarding or rebound.  Lymphadenopathy:     Head:     Right side of head: No submandibular, tonsillar or occipital adenopathy.     Left side of head: No submandibular, tonsillar or occipital adenopathy.     Cervical: No cervical adenopathy.  Skin:    General: Skin is warm.     Capillary Refill: Capillary refill takes less than 2 seconds.     Coloration: Skin is not pale.     Findings: No abrasion, erythema, petechiae or rash. Rash is not papular, urticarial or vesicular.     Comments: No rashes appreciated.   Neurological:     Mental Status: She is alert.  Psychiatric:        Behavior: Behavior is cooperative.      Diagnostic studies: none        Drexel Gentles, MD  [facility] 

## 2023-08-24 NOTE — Patient Instructions (Addendum)
 1. Mild persistent asthma, uncomplicated - Lung testing not done today. - I think that you are doing well from a breathing perspective.  - Daily controller medication(s): NOTHING  - Prior to physical activity: albuterol  2 puffs 10-15 minutes before physical activity. - Rescue medications: DuoNeb nebulizer one vial every 4-6 hours as needed - Changes during respiratory infections or worsening symptoms: Add on Symbicort  1-2 puffs twice daily for TWO WEEKS. - Asthma control goals:  * Full participation in all desired activities (may need albuterol  before activity) * Albuterol  use two time or less a week on average (not counting use with activity) * Cough interfering with sleep two time or less a month * Oral steroids no more than once a year * No hospitalizations  2. Perennial allergic rhinitis - The addition of the Claritin should help with the postnasal drip.  3. Concern for food intolerance - Salt is know to cause high blood pressure (by causing retention of fluid in your blood vessels).  - I agree with holding off on the amlodipine  to see how this works. - Your blood pressure is low today, which can cause light headedness.  - I think you might benefit from seeing the integrative folks at: [facility]://[facility]org/ - You can call [phone] to make an appointment or talk to them about anticipated costs.  - I am not sure about whether they accept insurances or not.  - You can also look into Everlywell testing which you can purchase online (notably Amazon and other places).  - This testing is not validated, so we do not do it at all.  - But there are some patients that find it helpful.  - You can try taking CLARITIN (loratadine) daily to see if this helps.  - Claritin has the least amount of side effects.   4. Return in about 1 year (around 08/23/2024). You can have the follow up appointment with Dr. Idolina Maker or a Nurse Practicioner (our Nurse Practitioners are excellent  and always have Physician oversight!).    Please inform us  of any Emergency Department visits, hospitalizations, or changes in symptoms. Call us  before going to the ED for breathing or allergy  symptoms since we might be able to fit you in for a sick visit. Feel free to contact us  anytime with any questions, problems, or concerns.  It was a pleasure to see you again today!  Websites that have reliable patient information: 1. American Academy of Asthma, Allergy , and Immunology: www.aaaai.org 2. Food Allergy  Research and Education (FARE): foodallergy.org 3. Mothers of Asthmatics: http://www.asthmacommunitynetwork.org 4. American College of Allergy , Asthma, and Immunology: www.acaai.org      "Like" us  on Facebook and Instagram for our latest updates!      A healthy democracy works best when [employer] participate! Make sure you are registered to vote! If you have moved or changed any of your contact information, you will need to get this updated before voting! Scan the QR codes below to learn more!

## 2023-08-25 ENCOUNTER — Encounter: Payer: Self-pay | Admitting: Allergy & Immunology

## 2023-08-25 ENCOUNTER — Telehealth: Payer: Self-pay | Admitting: Cardiovascular Disease

## 2023-08-25 DIAGNOSIS — T7840XD Allergy, unspecified, subsequent encounter: Secondary | ICD-10-CM

## 2023-08-25 DIAGNOSIS — T7800XD Anaphylactic reaction due to unspecified food, subsequent encounter: Secondary | ICD-10-CM

## 2023-08-25 NOTE — Telephone Encounter (Signed)
 Pt called in asking to switch to Dr. Theodis Fiscal, is this switch okay?

## 2023-08-26 ENCOUNTER — Emergency Department (HOSPITAL_COMMUNITY)

## 2023-08-26 ENCOUNTER — Encounter (HOSPITAL_COMMUNITY): Payer: Self-pay | Admitting: Emergency Medicine

## 2023-08-26 ENCOUNTER — Emergency Department (HOSPITAL_COMMUNITY)
Admission: EM | Admit: 2023-08-26 | Discharge: 2023-08-26 | Disposition: A | Discharge status: Home / Self Care | Attending: Emergency Medicine | Admitting: Emergency Medicine

## 2023-08-26 ENCOUNTER — Other Ambulatory Visit: Payer: Self-pay

## 2023-08-26 DIAGNOSIS — E049 Nontoxic goiter, unspecified: Secondary | ICD-10-CM | POA: Diagnosis not present

## 2023-08-26 DIAGNOSIS — R519 Headache, unspecified: Secondary | ICD-10-CM | POA: Diagnosis not present

## 2023-08-26 DIAGNOSIS — R9082 White matter disease, unspecified: Secondary | ICD-10-CM | POA: Diagnosis not present

## 2023-08-26 DIAGNOSIS — Z91040 Latex allergy status: Secondary | ICD-10-CM | POA: Diagnosis not present

## 2023-08-26 DIAGNOSIS — R079 Chest pain, unspecified: Secondary | ICD-10-CM | POA: Diagnosis not present

## 2023-08-26 DIAGNOSIS — R42 Dizziness and giddiness: Secondary | ICD-10-CM | POA: Diagnosis not present

## 2023-08-26 DIAGNOSIS — R531 Weakness: Secondary | ICD-10-CM | POA: Diagnosis not present

## 2023-08-26 DIAGNOSIS — G43809 Other migraine, not intractable, without status migrainosus: Secondary | ICD-10-CM | POA: Diagnosis not present

## 2023-08-26 LAB — CBC
HCT: 37.2 % (ref 36.0–46.0)
Hemoglobin: 13.5 g/dL (ref 12.0–15.0)
MCH: 29 pg (ref 26.0–34.0)
MCHC: 36.3 g/dL — ABNORMAL HIGH (ref 30.0–36.0)
MCV: 79.8 fL — ABNORMAL LOW (ref 80.0–100.0)
Platelets: 361 10*3/uL (ref 150–400)
RBC: 4.66 MIL/uL (ref 3.87–5.11)
RDW: 14.1 % (ref 11.5–15.5)
WBC: 4.9 10*3/uL (ref 4.0–10.5)
nRBC: 0 % (ref 0.0–0.2)

## 2023-08-26 LAB — COMPREHENSIVE METABOLIC PANEL WITH GFR
ALT: 23 U/L (ref 0–44)
AST: 31 U/L (ref 15–41)
Albumin: 4.1 g/dL (ref 3.5–5.0)
Alkaline Phosphatase: 57 U/L (ref 38–126)
Anion gap: 11 (ref 5–15)
BUN: 16 mg/dL (ref 8–23)
CO2: 24 mmol/L (ref 22–32)
Calcium: 10.1 mg/dL (ref 8.9–10.3)
Chloride: 103 mmol/L (ref 98–111)
Creatinine, Ser: 0.8 mg/dL (ref 0.44–1.00)
GFR, Estimated: >60 mL/min (ref >60)
Glucose, Bld: 106 mg/dL — ABNORMAL HIGH (ref 70–99)
Potassium: 3.5 mmol/L (ref 3.5–5.1)
Sodium: 138 mmol/L (ref 135–145)
Total Bilirubin: 1.1 mg/dL (ref 0.0–1.2)
Total Protein: 7.6 g/dL (ref 6.5–8.1)

## 2023-08-26 LAB — DIFFERENTIAL
Abs Immature Granulocytes: 0.01 10*3/uL (ref 0.00–0.07)
Basophils Absolute: 0.1 10*3/uL (ref 0.0–0.1)
Basophils Relative: 1 %
Eosinophils Absolute: 0.1 10*3/uL (ref 0.0–0.5)
Eosinophils Relative: 1 %
Immature Granulocytes: 0 %
Lymphocytes Relative: 37 %
Lymphs Abs: 1.8 10*3/uL (ref 0.7–4.0)
Monocytes Absolute: 0.3 10*3/uL (ref 0.1–1.0)
Monocytes Relative: 6 %
Neutro Abs: 2.7 10*3/uL (ref 1.7–7.7)
Neutrophils Relative %: 55 %

## 2023-08-26 LAB — I-STAT CHEM 8, ED
BUN: 18 mg/dL (ref 8–23)
Calcium, Ion: 1.28 mmol/L (ref 1.15–1.40)
Chloride: 103 mmol/L (ref 98–111)
Creatinine, Ser: 0.9 mg/dL (ref 0.44–1.00)
Glucose, Bld: 103 mg/dL — ABNORMAL HIGH (ref 70–99)
HCT: 41 % (ref 36.0–46.0)
Hemoglobin: 13.9 g/dL (ref 12.0–15.0)
Potassium: 3.5 mmol/L (ref 3.5–5.1)
Sodium: 139 mmol/L (ref 135–145)
TCO2: 24 mmol/L (ref 22–32)

## 2023-08-26 LAB — TROPONIN I (HIGH SENSITIVITY)
Troponin I (High Sensitivity): 6 ng/L (ref <18)
Troponin I (High Sensitivity): 6 ng/L (ref <18)

## 2023-08-26 LAB — ETHANOL: Alcohol, Ethyl (B): <15 mg/dL (ref <15)

## 2023-08-26 LAB — CBG MONITORING, ED
Glucose-Capillary: 98 mg/dL (ref 70–99)
Glucose-Capillary: 98 mg/dL (ref 70–99)

## 2023-08-26 LAB — PROTIME-INR
INR: 1.1 (ref 0.8–1.2)
Prothrombin Time: 14 s (ref 11.4–15.2)

## 2023-08-26 LAB — APTT: aPTT: 26 s (ref 24–36)

## 2023-08-26 MED ORDER — SODIUM CHLORIDE 0.9% FLUSH
3.0000 mL | Freq: Once | INTRAVENOUS | Status: AC
  Administered 2023-08-26: 3 mL via INTRAVENOUS

## 2023-08-26 MED ORDER — MECLIZINE HCL 25 MG PO TABS
25.0000 mg | ORAL_TABLET | Freq: Once | ORAL | Status: AC
  Administered 2023-08-26: 25 mg via ORAL
  Filled 2023-08-26: qty 1

## 2023-08-26 MED ORDER — IOHEXOL 350 MG/ML SOLN
75.0000 mL | Freq: Once | INTRAVENOUS | Status: AC | PRN
  Administered 2023-08-26: 75 mL via INTRAVENOUS

## 2023-08-26 MED ORDER — KETOROLAC TROMETHAMINE 15 MG/ML IJ SOLN
15.0000 mg | Freq: Once | INTRAMUSCULAR | Status: AC
  Administered 2023-08-26: 15 mg via INTRAVENOUS
  Filled 2023-08-26: qty 1

## 2023-08-26 MED ORDER — DIPHENHYDRAMINE HCL 50 MG/ML IJ SOLN
25.0000 mg | Freq: Once | INTRAMUSCULAR | Status: AC
  Administered 2023-08-26: 25 mg via INTRAVENOUS
  Filled 2023-08-26: qty 1

## 2023-08-26 NOTE — ED Provider Notes (Signed)
 [place] [facility] Provider Note   CSN: [id number] Arrival date & time: 08/26/23  4098     History  Chief Complaint  Patient presents with   Dizziness   Weakness   Headache    Bianca [name] is a 68 y.o. female.  She is 35-year-old female with prior medical history detailed below presents for evaluation.  Patient reports that last night around 8 PM she developed pressure on the left side of her head.  She feels like there is a "bump on the inside" of her head.  This pressure persisted until she went to bed around 11 PM.  She woke up this morning with the same left-sided headache and discomfort.  She also noted at this time a tingling in her left hand and arm.  She denies weakness.  She denies speech change.  She denies vision change.  She denies nausea or vomiting.  She denies fever.  She is ambulatory without difficulty.  She reports mild lightheadedness.  She reports that symptoms are worse with rapid movement of her head.  The history is provided by the patient.       Home Medications Prior to Admission medications   Medication Sig Start Date End Date Taking? Authorizing Provider  albuterol  (VENTOLIN  HFA) 108 (90 Base) MCG/ACT inhaler Inhale 1-2 puffs into the lungs every 6 (six) hours as needed for wheezing or shortness of breath. 08/24/23   Rochester Chuck, MD  amLODipine  (NORVASC ) 2.5 MG tablet Take 1 tablet (2.5 mg total) by mouth 2 (two) times daily. Patient taking differently: Take 2.5 mg by mouth daily. 01/24/23   Odie Benne, MD  ascorbic acid (VITAMIN C) 500 MG tablet Take 500 mg by mouth daily.    [provider]  budesonide -formoterol  (SYMBICORT ) 160-4.5 MCG/ACT inhaler Inhale 2 puffs into the lungs 2 (two) times daily. 08/24/23   Rochester Chuck, MD  cholecalciferol (VITAMIN D3) 25 MCG (1000 UNIT) tablet Take 1,000 Units by mouth daily.    [provider]  EPINEPHrine  0.3 mg/0.3 mL IJ SOAJ  injection Inject 0.3 mg into the muscle as needed for anaphylaxis. 11/08/22   Rochester Chuck, MD  famotidine (PEPCID) 20 MG tablet Take 20 mg by mouth daily. 12/29/22   [provider]  fluticasone  (FLONASE) 50 MCG/ACT nasal spray Place 1 spray into both nostrils as needed for allergies.    [provider]  guaiFENesin (MUCINEX) 600 MG 12 hr tablet Take by mouth as needed.     [provider]  levocetirizine (XYZAL ) 5 MG tablet Take 1 tablet (5 mg total) by mouth every evening. 08/17/23   Adolph Hoop, PA-C  loratadine  (CLARITIN ) 10 MG tablet Take 1 tablet (10 mg total) by mouth daily as needed for allergies (Can take an extra dose during flare ups.). 08/24/23   Rochester Chuck, MD      Allergies    Codeine, Amoxicillin, Amoxicillin-pot clavulanate, Bee venom, Elemental sulfur, Latex, Metoprolol , Milk-related compounds, Shellfish allergy , Sulfa antibiotics, and Sulfamethoxazole    Review of Systems   Review of Systems  All other systems reviewed and are negative.   Physical Exam Updated Vital Signs BP (!) 141/80 (BP Location: Right Arm)   Pulse 76   Temp 98.2 F (36.8 C)   Resp 16   Ht 5\' 1"  (1.549 m)   Wt 63.5 kg   LMP 11/16/2009   SpO2 100%   BMI 26.45 kg/m  Physical Exam Vitals and nursing  note reviewed.  Constitutional:      General: She is not in acute distress.    Appearance: Normal appearance. She is well-developed.  HENT:     Head: Normocephalic and atraumatic.  Eyes:     Conjunctiva/sclera: Conjunctivae normal.     Pupils: Pupils are equal, round, and reactive to light.  Cardiovascular:     Rate and Rhythm: Normal rate and regular rhythm.     Heart sounds: Normal heart sounds.  Pulmonary:     Effort: Pulmonary effort is normal. No respiratory distress.     Breath sounds: Normal breath sounds.  Abdominal:     General: There is no distension.     Palpations: Abdomen is soft.     Tenderness: There is no abdominal tenderness.   Musculoskeletal:        General: No deformity. Normal range of motion.     Cervical back: Normal range of motion and neck supple.  Skin:    General: Skin is warm and dry.  Neurological:     General: No focal deficit present.     Mental Status: She is alert and oriented to person, place, and time. Mental status is at baseline.     GCS: GCS eye subscore is 4. GCS verbal subscore is 5. GCS motor subscore is 6.     Cranial Nerves: No cranial nerve deficit, dysarthria or facial asymmetry.     Motor: No weakness.     Gait: Gait normal.     ED Results / Procedures / Treatments   Labs (all labs ordered are listed, but only abnormal results are displayed) Labs Reviewed  I-STAT CHEM 8, ED - Abnormal; Notable for the following components:      Result Value   Glucose, Bld 103 (*)    All other components within normal limits  PROTIME-INR  APTT  CBC  DIFFERENTIAL  COMPREHENSIVE METABOLIC PANEL WITH GFR  ETHANOL  CBG MONITORING, ED  CBG MONITORING, ED  TROPONIN I (HIGH SENSITIVITY)    EKG None  Radiology No results found.  Procedures Procedures    Medications Ordered in ED Medications  sodium chloride  flush (NS) 0.9 % injection 3 mL (has no administration in time range)    ED Course/ Medical Decision Making/ A&P                                 Medical Decision Making Amount and/or Complexity of Data Reviewed Labs: ordered. Radiology: ordered.  Risk Prescription drug management.    Medical Screen Complete  This patient presented to the ED with complaint of headache, paresthesia.  This complaint involves an extensive number of treatment options. The initial differential diagnosis includes, but is not limited to, migraine, CVA, metabolic abnormality, etc.  This presentation is: Acute, Self-Limited, Previously Undiagnosed, Uncertain Prognosis, Complicated, Systemic Symptoms, and Threat to Life/Bodily Function  Patient is presenting with headache and multiple  associated symptoms.  Presentation is perhaps most consistent with complex migraine.  Obtained workup is reassuringly without acute abnormality.  Specifically CTA head and neck and MRI brain are without acute pathology identified.  Patient treated for migraine.  On reevaluation she is much improved.  The patient has established outpatient providers.  She understands need for close outpatient follow-up.  Strict return precautions given understood.  Additional history obtained:  External records from outside sources obtained and reviewed including prior ED visits and prior Inpatient records.   Problem List /  ED Course:  Headache, paresthesia   Reevaluation:  After the interventions noted above, I reevaluated the patient and found that they have: resolved Disposition:  After consideration of the diagnostic results and the patients response to treatment, I feel that the patent would benefit from close outpatient follow-up.          Final Clinical Impression(s) / ED Diagnoses Final diagnoses:  Other migraine without status migrainosus, not intractable    Rx / DC Orders ED Discharge Orders     None         Burnette Carte, MD 08/26/23 1338

## 2023-08-26 NOTE — ED Triage Notes (Signed)
 Pt arrives via POV from home with complaint of waking and feeling dizzy. Pt also states when she woke up she noted her left arm was weaker than normal as well as hand left arm felt heavy with tingling in fingers. No droop, drift, slurred speech. Pt reports LKW 11pm. Pt states went to bed with a headache along left side of her head which is still present. PT alert, oriented x4, NAD at present.

## 2023-08-26 NOTE — ED Notes (Signed)
 Pt complaining of worsening head pressure. MD notified.

## 2023-08-26 NOTE — Discharge Instructions (Signed)
 Return for any problem.  ?

## 2023-08-26 NOTE — ED Notes (Signed)
 Nt called CCMD to put pt on monitor

## 2023-08-26 NOTE — ED Notes (Signed)
 Patient Alert and oriented to baseline. Stable and ambulatory to baseline. Patient verbalized understanding of the discharge instructions.  Patient belongings were taken by the patient.

## 2023-08-26 NOTE — ED Notes (Signed)
 Lab notified to add troponin

## 2023-08-28 DIAGNOSIS — F411 Generalized anxiety disorder: Secondary | ICD-10-CM | POA: Diagnosis not present

## 2023-08-29 ENCOUNTER — Telehealth: Payer: Self-pay | Admitting: *Deleted

## 2023-08-29 DIAGNOSIS — I10 Essential (primary) hypertension: Secondary | ICD-10-CM | POA: Diagnosis not present

## 2023-08-29 DIAGNOSIS — K5900 Constipation, unspecified: Secondary | ICD-10-CM | POA: Diagnosis not present

## 2023-08-29 DIAGNOSIS — E7800 Pure hypercholesterolemia, unspecified: Secondary | ICD-10-CM | POA: Diagnosis not present

## 2023-08-29 DIAGNOSIS — Z713 Dietary counseling and surveillance: Secondary | ICD-10-CM | POA: Diagnosis not present

## 2023-08-29 NOTE — Telephone Encounter (Signed)
 Called to discuss with patient starting Xolair 150mg  every 4 weeks for food allergies. Her copay will likely be $100 if she needs to try for Xolair PAP can mail application to her. SHe advised she is having some additional tests coming up and wants to wait on this for now and will reach back out to me if she decides to proceed with therapy

## 2023-08-29 NOTE — Telephone Encounter (Signed)
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 Total IgE 57.

## 2023-08-30 DIAGNOSIS — R634 Abnormal weight loss: Secondary | ICD-10-CM | POA: Diagnosis not present

## 2023-08-30 DIAGNOSIS — R1084 Generalized abdominal pain: Secondary | ICD-10-CM | POA: Diagnosis not present

## 2023-08-30 DIAGNOSIS — R110 Nausea: Secondary | ICD-10-CM | POA: Diagnosis not present

## 2023-08-30 DIAGNOSIS — R197 Diarrhea, unspecified: Secondary | ICD-10-CM | POA: Diagnosis not present

## 2023-08-30 DIAGNOSIS — R12 Heartburn: Secondary | ICD-10-CM | POA: Diagnosis not present

## 2023-08-30 DIAGNOSIS — R140 Abdominal distension (gaseous): Secondary | ICD-10-CM | POA: Diagnosis not present

## 2023-08-30 DIAGNOSIS — R195 Other fecal abnormalities: Secondary | ICD-10-CM | POA: Diagnosis not present

## 2023-08-30 NOTE — Telephone Encounter (Signed)
 Sounds good. Thank you

## 2023-08-31 DIAGNOSIS — F411 Generalized anxiety disorder: Secondary | ICD-10-CM | POA: Diagnosis not present

## 2023-09-06 DIAGNOSIS — T7800XD Anaphylactic reaction due to unspecified food, subsequent encounter: Secondary | ICD-10-CM | POA: Diagnosis not present

## 2023-09-06 DIAGNOSIS — T7840XD Allergy, unspecified, subsequent encounter: Secondary | ICD-10-CM | POA: Diagnosis not present

## 2023-09-06 NOTE — Addendum Note (Signed)
 Addended by: Rochester Chuck on: 09/06/2023 09:03 AM   Modules accepted: Orders

## 2023-09-07 DIAGNOSIS — R1032 Left lower quadrant pain: Secondary | ICD-10-CM | POA: Diagnosis not present

## 2023-09-07 DIAGNOSIS — R6339 Other feeding difficulties: Secondary | ICD-10-CM | POA: Diagnosis not present

## 2023-09-07 DIAGNOSIS — R634 Abnormal weight loss: Secondary | ICD-10-CM | POA: Diagnosis not present

## 2023-09-07 DIAGNOSIS — R109 Unspecified abdominal pain: Secondary | ICD-10-CM | POA: Diagnosis not present

## 2023-09-08 LAB — ALLERGEN, SWEET POTATO,F54: Allergen Sweet Potato IgE: <0.1 kU/L

## 2023-09-08 LAB — F245-IGE EGG, WHOLE: Egg, Whole IgE: 0.24 kU/L — AB

## 2023-09-08 LAB — ALLERGEN, CHICKEN F83: Chicken IgE: <0.1 kU/L

## 2023-09-08 LAB — ALLERGEN, TURKEY, F284: Allergen Turkey IgE: 0.13 kU/L — AB

## 2023-09-12 ENCOUNTER — Ambulatory Visit: Payer: Self-pay | Admitting: Allergy & Immunology

## 2023-09-12 DIAGNOSIS — E049 Nontoxic goiter, unspecified: Secondary | ICD-10-CM | POA: Diagnosis not present

## 2023-09-12 NOTE — Telephone Encounter (Signed)
Pt calling back for an update  

## 2023-09-13 NOTE — Telephone Encounter (Signed)
Informed pt of switch

## 2023-09-18 DIAGNOSIS — K219 Gastro-esophageal reflux disease without esophagitis: Secondary | ICD-10-CM | POA: Diagnosis not present

## 2023-09-18 DIAGNOSIS — I10 Essential (primary) hypertension: Secondary | ICD-10-CM | POA: Diagnosis not present

## 2023-09-18 DIAGNOSIS — E049 Nontoxic goiter, unspecified: Secondary | ICD-10-CM | POA: Diagnosis not present

## 2023-09-18 DIAGNOSIS — R42 Dizziness and giddiness: Secondary | ICD-10-CM | POA: Diagnosis not present

## 2023-09-18 DIAGNOSIS — G4700 Insomnia, unspecified: Secondary | ICD-10-CM | POA: Diagnosis not present

## 2023-09-18 DIAGNOSIS — R609 Edema, unspecified: Secondary | ICD-10-CM | POA: Diagnosis not present

## 2023-09-18 DIAGNOSIS — J302 Other seasonal allergic rhinitis: Secondary | ICD-10-CM | POA: Diagnosis not present

## 2023-09-18 DIAGNOSIS — E7800 Pure hypercholesterolemia, unspecified: Secondary | ICD-10-CM | POA: Diagnosis not present

## 2023-09-18 DIAGNOSIS — F419 Anxiety disorder, unspecified: Secondary | ICD-10-CM | POA: Diagnosis not present

## 2023-09-19 DIAGNOSIS — D132 Benign neoplasm of duodenum: Secondary | ICD-10-CM | POA: Diagnosis not present

## 2023-09-19 DIAGNOSIS — K2970 Gastritis, unspecified, without bleeding: Secondary | ICD-10-CM | POA: Diagnosis not present

## 2023-09-19 DIAGNOSIS — R1084 Generalized abdominal pain: Secondary | ICD-10-CM | POA: Diagnosis not present

## 2023-09-19 DIAGNOSIS — K279 Peptic ulcer, site unspecified, unspecified as acute or chronic, without hemorrhage or perforation: Secondary | ICD-10-CM | POA: Diagnosis not present

## 2023-09-26 DIAGNOSIS — R42 Dizziness and giddiness: Secondary | ICD-10-CM | POA: Diagnosis not present

## 2023-09-26 DIAGNOSIS — R5383 Other fatigue: Secondary | ICD-10-CM | POA: Diagnosis not present

## 2023-09-26 DIAGNOSIS — R079 Chest pain, unspecified: Secondary | ICD-10-CM | POA: Diagnosis not present

## 2023-09-28 DIAGNOSIS — F411 Generalized anxiety disorder: Secondary | ICD-10-CM | POA: Diagnosis not present

## 2023-10-12 ENCOUNTER — Telehealth: Payer: Self-pay | Admitting: Cardiovascular Disease

## 2023-10-12 NOTE — Telephone Encounter (Signed)
 Called pt again. Full name and DOB verified.  Pt states she has had a L sided headache for the past couple months and a nervous, shaky feeling in her chest. Also states she feels wobbly when her BP goes as high as 135s. Pt thinks these symptoms are related to her Amlodipine  and believes her symptoms get worse when she takes Pepcid (takes pepcid twice a day).  Pt states she has been hydrating herself, only stressor is her anxiety. Pt seen by [facility] for a second opinion d/t not being able to see Dr. Raford soon.   Recent BP readings: 121/78 - 131/84 -135/84 (off the top of her head).  Advised pt that symptoms might not be related to Amlodipine  d/t being on medications for 10+ years.  Will route message to provider for review.

## 2023-10-12 NOTE — Telephone Encounter (Signed)
 Called and spoke to pt. Full name and DOB verified.  Pt aware of MD's comment. Advised to see [facility] cardiology since they were the last providers to see her. Appt scheduled with TR in Oct 2025. No further comments or questions were asked.

## 2023-10-12 NOTE — Telephone Encounter (Signed)
 Pt c/o medication issue:  1. Name of Medication: amLODipine  (NORVASC ) 2.5 MG tablet   2. How are you currently taking this medication (dosage and times per day)? 2.5 mg, 1 time daily   3. Are you having a reaction (difficulty breathing--STAT)? Headaches  4. What is your medication issue? Med causing Headaches, lightheaded, nervous/shaky feeling

## 2023-10-12 NOTE — Telephone Encounter (Signed)
 Called and spoke to pt; pt has someone else on the other line. Call dropped when she transferred.

## 2023-10-12 NOTE — Telephone Encounter (Signed)
 Called and left voice message to call back. MC sent to follow up.

## 2023-10-12 NOTE — Telephone Encounter (Signed)
 Agree with plan of care as provided by Dr. Raford.   Quavion Boule S Jontavia Leatherbury, NP
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
## 2023-11-01 DIAGNOSIS — F411 Generalized anxiety disorder: Secondary | ICD-10-CM | POA: Diagnosis not present

## 2023-11-02 DIAGNOSIS — M5412 Radiculopathy, cervical region: Secondary | ICD-10-CM | POA: Diagnosis not present

## 2023-11-02 DIAGNOSIS — N644 Mastodynia: Secondary | ICD-10-CM | POA: Diagnosis not present

## 2023-11-03 ENCOUNTER — Encounter: Payer: Self-pay | Admitting: Neurology

## 2023-11-03 ENCOUNTER — Ambulatory Visit: Admitting: Neurology

## 2023-11-03 VITALS — BP 131/78 | HR 69 | Ht 61.0 in | Wt 134.0 lb

## 2023-11-03 DIAGNOSIS — R202 Paresthesia of skin: Secondary | ICD-10-CM

## 2023-11-03 DIAGNOSIS — M542 Cervicalgia: Secondary | ICD-10-CM | POA: Diagnosis not present

## 2023-11-03 DIAGNOSIS — R519 Headache, unspecified: Secondary | ICD-10-CM | POA: Diagnosis not present

## 2023-11-03 DIAGNOSIS — R200 Anesthesia of skin: Secondary | ICD-10-CM

## 2023-11-03 NOTE — Patient Instructions (Addendum)
 I agree with you starting physical therapy. This will help with your neck and maybe your arm and head as well.  We discussed nortriptyline for headaches. This can also potentially help with anxiety. You want to think about it. Let me know if you want me to prescribe it. The most common side effects are sleepiness and dry mouth.   I will see you again in about 4 months.  The physicians and staff at [facility] are committed to providing excellent care. You may receive a survey requesting feedback about your experience at our office. We strive to receive very good responses to the survey questions. If you feel that your experience would prevent you from giving the office a very good  response, please contact our office to try to remedy the situation. We may be reached at [phone]. Thank you for taking the time out of your busy day to complete the survey.  Venetia Potters, MD [facility]

## 2023-11-06 ENCOUNTER — Other Ambulatory Visit: Payer: Self-pay | Admitting: Physician Assistant

## 2023-11-06 DIAGNOSIS — N644 Mastodynia: Secondary | ICD-10-CM

## 2023-11-07 ENCOUNTER — Ambulatory Visit
Admission: RE | Admit: 2023-11-07 | Discharge: 2023-11-07 | Disposition: A | Discharge status: Home / Self Care | Source: Ambulatory Visit | Attending: Physician Assistant | Admitting: Physician Assistant

## 2023-11-07 ENCOUNTER — Ambulatory Visit: Admission: RE | Admit: 2023-11-07 | Source: Ambulatory Visit

## 2023-11-07 DIAGNOSIS — N644 Mastodynia: Secondary | ICD-10-CM

## 2023-11-20 ENCOUNTER — Other Ambulatory Visit: Payer: Self-pay

## 2023-11-20 ENCOUNTER — Encounter: Payer: Self-pay | Admitting: Neurology

## 2023-11-20 DIAGNOSIS — M12811 Other specific arthropathies, not elsewhere classified, right shoulder: Secondary | ICD-10-CM

## 2023-11-20 DIAGNOSIS — R42 Dizziness and giddiness: Secondary | ICD-10-CM

## 2023-11-20 DIAGNOSIS — E049 Nontoxic goiter, unspecified: Secondary | ICD-10-CM

## 2023-11-20 DIAGNOSIS — F411 Generalized anxiety disorder: Secondary | ICD-10-CM | POA: Diagnosis not present

## 2023-11-20 DIAGNOSIS — R200 Anesthesia of skin: Secondary | ICD-10-CM

## 2023-11-20 DIAGNOSIS — M5412 Radiculopathy, cervical region: Secondary | ICD-10-CM | POA: Diagnosis not present

## 2023-11-20 DIAGNOSIS — R519 Headache, unspecified: Secondary | ICD-10-CM

## 2023-11-20 DIAGNOSIS — M542 Cervicalgia: Secondary | ICD-10-CM

## 2023-11-20 DIAGNOSIS — H9191 Unspecified hearing loss, right ear: Secondary | ICD-10-CM

## 2023-11-20 DIAGNOSIS — R2689 Other abnormalities of gait and mobility: Secondary | ICD-10-CM

## 2023-11-21 ENCOUNTER — Other Ambulatory Visit

## 2023-11-21 DIAGNOSIS — E049 Nontoxic goiter, unspecified: Secondary | ICD-10-CM | POA: Diagnosis not present

## 2023-11-21 DIAGNOSIS — M542 Cervicalgia: Secondary | ICD-10-CM | POA: Diagnosis not present

## 2023-11-21 DIAGNOSIS — R202 Paresthesia of skin: Secondary | ICD-10-CM | POA: Diagnosis not present

## 2023-11-21 DIAGNOSIS — M12811 Other specific arthropathies, not elsewhere classified, right shoulder: Secondary | ICD-10-CM | POA: Diagnosis not present

## 2023-11-21 DIAGNOSIS — R200 Anesthesia of skin: Secondary | ICD-10-CM | POA: Diagnosis not present

## 2023-11-21 DIAGNOSIS — H9191 Unspecified hearing loss, right ear: Secondary | ICD-10-CM | POA: Diagnosis not present

## 2023-11-21 DIAGNOSIS — R1010 Upper abdominal pain, unspecified: Secondary | ICD-10-CM | POA: Diagnosis not present

## 2023-11-21 DIAGNOSIS — R634 Abnormal weight loss: Secondary | ICD-10-CM | POA: Diagnosis not present

## 2023-11-21 DIAGNOSIS — R519 Headache, unspecified: Secondary | ICD-10-CM | POA: Diagnosis not present

## 2023-11-21 DIAGNOSIS — R2689 Other abnormalities of gait and mobility: Secondary | ICD-10-CM | POA: Diagnosis not present

## 2023-11-21 DIAGNOSIS — R42 Dizziness and giddiness: Secondary | ICD-10-CM | POA: Diagnosis not present

## 2023-11-22 ENCOUNTER — Ambulatory Visit: Payer: Self-pay | Admitting: Neurology

## 2023-11-22 LAB — C-REACTIVE PROTEIN: CRP: <3 mg/L (ref <8.0)

## 2023-11-22 LAB — SEDIMENTATION RATE: Sed Rate: 9 mm/h (ref 0–30)

## 2023-11-27 DIAGNOSIS — M5412 Radiculopathy, cervical region: Secondary | ICD-10-CM | POA: Diagnosis not present

## 2023-11-28 DIAGNOSIS — L218 Other seborrheic dermatitis: Secondary | ICD-10-CM | POA: Diagnosis not present

## 2023-11-28 DIAGNOSIS — L308 Other specified dermatitis: Secondary | ICD-10-CM | POA: Diagnosis not present

## 2023-11-30 DIAGNOSIS — F411 Generalized anxiety disorder: Secondary | ICD-10-CM | POA: Diagnosis not present

## 2023-12-06 DIAGNOSIS — M5412 Radiculopathy, cervical region: Secondary | ICD-10-CM | POA: Diagnosis not present

## 2023-12-06 DIAGNOSIS — F411 Generalized anxiety disorder: Secondary | ICD-10-CM | POA: Diagnosis not present

## 2023-12-07 DIAGNOSIS — L309 Dermatitis, unspecified: Secondary | ICD-10-CM | POA: Diagnosis not present

## 2023-12-07 DIAGNOSIS — J45909 Unspecified asthma, uncomplicated: Secondary | ICD-10-CM | POA: Diagnosis not present

## 2023-12-07 DIAGNOSIS — J302 Other seasonal allergic rhinitis: Secondary | ICD-10-CM | POA: Diagnosis not present

## 2023-12-07 DIAGNOSIS — E7800 Pure hypercholesterolemia, unspecified: Secondary | ICD-10-CM | POA: Diagnosis not present

## 2023-12-07 DIAGNOSIS — I10 Essential (primary) hypertension: Secondary | ICD-10-CM | POA: Diagnosis not present

## 2023-12-07 DIAGNOSIS — E049 Nontoxic goiter, unspecified: Secondary | ICD-10-CM | POA: Diagnosis not present

## 2023-12-07 DIAGNOSIS — F419 Anxiety disorder, unspecified: Secondary | ICD-10-CM | POA: Diagnosis not present

## 2023-12-07 DIAGNOSIS — G4700 Insomnia, unspecified: Secondary | ICD-10-CM | POA: Diagnosis not present

## 2023-12-07 DIAGNOSIS — R630 Anorexia: Secondary | ICD-10-CM | POA: Diagnosis not present

## 2023-12-07 DIAGNOSIS — Z8639 Personal history of other endocrine, nutritional and metabolic disease: Secondary | ICD-10-CM | POA: Diagnosis not present

## 2023-12-13 DIAGNOSIS — M5412 Radiculopathy, cervical region: Secondary | ICD-10-CM | POA: Diagnosis not present

## 2023-12-15 DIAGNOSIS — I10 Essential (primary) hypertension: Secondary | ICD-10-CM | POA: Diagnosis not present

## 2023-12-20 DIAGNOSIS — F411 Generalized anxiety disorder: Secondary | ICD-10-CM | POA: Diagnosis not present

## 2023-12-20 DIAGNOSIS — M5412 Radiculopathy, cervical region: Secondary | ICD-10-CM | POA: Diagnosis not present

## 2023-12-21 DIAGNOSIS — G43909 Migraine, unspecified, not intractable, without status migrainosus: Secondary | ICD-10-CM | POA: Diagnosis not present

## 2023-12-21 DIAGNOSIS — R2689 Other abnormalities of gait and mobility: Secondary | ICD-10-CM | POA: Diagnosis not present

## 2023-12-21 DIAGNOSIS — H903 Sensorineural hearing loss, bilateral: Secondary | ICD-10-CM | POA: Diagnosis not present

## 2023-12-21 DIAGNOSIS — M542 Cervicalgia: Secondary | ICD-10-CM | POA: Diagnosis not present

## 2023-12-25 DIAGNOSIS — H903 Sensorineural hearing loss, bilateral: Secondary | ICD-10-CM | POA: Diagnosis not present

## 2023-12-25 DIAGNOSIS — M542 Cervicalgia: Secondary | ICD-10-CM | POA: Diagnosis not present

## 2023-12-25 DIAGNOSIS — R2689 Other abnormalities of gait and mobility: Secondary | ICD-10-CM | POA: Diagnosis not present

## 2023-12-25 DIAGNOSIS — G43809 Other migraine, not intractable, without status migrainosus: Secondary | ICD-10-CM | POA: Diagnosis not present

## 2023-12-26 DIAGNOSIS — M5412 Radiculopathy, cervical region: Secondary | ICD-10-CM | POA: Diagnosis not present

## 2024-01-01 DIAGNOSIS — E049 Nontoxic goiter, unspecified: Secondary | ICD-10-CM | POA: Diagnosis not present

## 2024-01-01 DIAGNOSIS — E7800 Pure hypercholesterolemia, unspecified: Secondary | ICD-10-CM | POA: Diagnosis not present

## 2024-01-01 DIAGNOSIS — Z713 Dietary counseling and surveillance: Secondary | ICD-10-CM | POA: Diagnosis not present

## 2024-01-01 DIAGNOSIS — I10 Essential (primary) hypertension: Secondary | ICD-10-CM | POA: Diagnosis not present

## 2024-01-03 DIAGNOSIS — M5412 Radiculopathy, cervical region: Secondary | ICD-10-CM | POA: Diagnosis not present

## 2024-01-04 DIAGNOSIS — F411 Generalized anxiety disorder: Secondary | ICD-10-CM | POA: Diagnosis not present

## 2024-01-05 DIAGNOSIS — F411 Generalized anxiety disorder: Secondary | ICD-10-CM | POA: Diagnosis not present

## 2024-01-17 DIAGNOSIS — R140 Abdominal distension (gaseous): Secondary | ICD-10-CM | POA: Diagnosis not present

## 2024-01-17 DIAGNOSIS — R109 Unspecified abdominal pain: Secondary | ICD-10-CM | POA: Diagnosis not present

## 2024-01-17 DIAGNOSIS — R1010 Upper abdominal pain, unspecified: Secondary | ICD-10-CM | POA: Diagnosis not present

## 2024-01-17 DIAGNOSIS — K219 Gastro-esophageal reflux disease without esophagitis: Secondary | ICD-10-CM | POA: Diagnosis not present

## 2024-01-17 DIAGNOSIS — K582 Mixed irritable bowel syndrome: Secondary | ICD-10-CM | POA: Diagnosis not present

## 2024-01-18 DIAGNOSIS — F411 Generalized anxiety disorder: Secondary | ICD-10-CM | POA: Diagnosis not present

## 2024-01-25 DIAGNOSIS — F411 Generalized anxiety disorder: Secondary | ICD-10-CM | POA: Diagnosis not present

## 2024-01-30 ENCOUNTER — Ambulatory Visit (HOSPITAL_BASED_OUTPATIENT_CLINIC_OR_DEPARTMENT_OTHER): Admitting: Cardiovascular Disease

## 2024-01-30 ENCOUNTER — Encounter (HOSPITAL_BASED_OUTPATIENT_CLINIC_OR_DEPARTMENT_OTHER): Payer: Self-pay | Admitting: Cardiovascular Disease

## 2024-01-30 VITALS — BP 118/68 | HR 70 | Ht 61.0 in | Wt 133.0 lb

## 2024-01-30 DIAGNOSIS — E010 Iodine-deficiency related diffuse (endemic) goiter: Secondary | ICD-10-CM | POA: Diagnosis not present

## 2024-01-30 DIAGNOSIS — I201 Angina pectoris with documented spasm: Secondary | ICD-10-CM | POA: Diagnosis not present

## 2024-01-30 DIAGNOSIS — I10 Essential (primary) hypertension: Secondary | ICD-10-CM | POA: Diagnosis not present

## 2024-01-30 DIAGNOSIS — I491 Atrial premature depolarization: Secondary | ICD-10-CM | POA: Diagnosis not present

## 2024-01-30 NOTE — Patient Instructions (Addendum)
 Consider switching from amlodipine  to spironolactone for your blood pressure.    Medication Instructions:  No changes today *If you need a refill on your cardiac medications before your next appointment, please call your pharmacy*  Lab Work: Return for blood work first thing in the morning  (one morning soon) catecholamines, metanephrines   Testing/Procedures: none  Follow-Up: At [facility], you and your health needs are our priority.  As part of our continuing mission to provide you with exceptional heart care, our providers are all part of one team.  This team includes your primary Cardiologist (physician) and Advanced Practice Providers or APPs (Physician Assistants and Nurse Practitioners) who all work together to provide you with the care you need, when you need it.  Your next appointment:   6 month(s)  Provider:   Annabella Scarce, MD, Rosaline Bane, NP, or Reche Finder, NP

## 2024-01-30 NOTE — Progress Notes (Signed)
 Cardiology Office Note:  .   Date:  01/30/2024  ID:  Bianca [name], DOB 1955-04-29, MRN [id number] PCP: Seabron Lenis, MD  [facility] Providers Cardiologist:  Lonni Cash, MD    History of Present Illness: Bianca    Bianca [name] is a 68 y.o. 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  She was previously a patient of Dr. Cash.  She is undergone cardiac catheterization in 2012 and 2014 and found to have normal coronary arteries.  Echo 04/2010 revealed normal systolic function.  She has been on amlodipine  which has controlled her blood pressure well.  She has had some mild lower extremity edema.  She wore cardiac monitor 07/2020 which showed sinus rhythm and PACs.  She reported wanting to stop amlodipine  because she felt she had been on it too long.  She was started on losartan  but felt weak so she started back on amlodipine .  Calcium  score was 0 on 06/2021.  She noted mild dizziness and palpitations 07/2022.  She was seen by neurology and brain MRI was negative.  Repeat monitor showed short runs of SVT lasting up to 5 beats.  Repeat echo revealed LVEF 60-65% without any significant abnormalities.  She saw Dr. [place] 05/2023 after an ED visit for nausea, vomiting, dyspnea, myalgias, and cough.  Symptoms were thought to be viral.  Dr. [place] did not think any cardiac evaluation was necessary at that time.  She called our office 09/2023 reporting left-sided headache and a nervous, shaky feeling in her chest.  Blood pressure was in the 130s and she felt that symptoms may be related to her amlodipine .  Discussed the use of AI scribe software for clinical note transcription with the patient, who gave verbal consent to proceed.  History of Present Illness [name] experiences fatigue, nervousness, and a shaky sensation in the center of her chest. Initially, she was taking amlodipine  2.5 mg daily, but her blood pressure fluctuated  between 109/60 and 97/60, which was considered low. Cutting the dose in half improved her fatigue, but stopping amlodipine  completely led to a rise in blood pressure after a few days, especially with triggers like antacids and salt intake.  She is on antacids for IBS and acid reflux, but some antacids cause her blood pressure to rise and give her a heavy feeling in her head. She has tried various antacids including Pepcid, Prevacid, Prilosec, and Pepto Bismol, noting that Tagamet caused her blood pressure to rise to 169/96. She experiences pressure in her chest and head with these medications, and sometimes feels lightheaded and has pressure in her head when taking pantoprazole .  She has been prescribed antidepressants but experiences a jittery feeling in the center of her chest and shortness of breath after taking them for a few days. Mirtazapine was helpful but intolerable due to these side effects. She has also experienced palpitations, particularly at night, which improve when she elevates her head. She does not check her blood pressure during these episodes but notes it is usually normal before bed.  She has unintentionally lost 30 pounds over the past couple of years and is trying to gain weight back to her comfortable level of 155 pounds. She also reports neck and shoulder pain, which she wonders might be related to her heart, but she is not currently experiencing palpitations. She participates in [employer] exercise classes twice a week without exacerbating her symptoms.  She has a history of low normal potassium  levels, fluctuating between 3.5 and 4.0.   ROS:  As per HPI  Studies Reviewed: .       Echo 11/01/22:  1. Left ventricular ejection fraction, by estimation, is 60 to 65%. The  left ventricle has normal function. The left ventricle has no regional  wall motion abnormalities. There is mild left ventricular hypertrophy of  the basal-septal segment. Left  ventricular diastolic  parameters were normal. The average left ventricular  global longitudinal strain is 21.3 %. The global longitudinal strain is  normal.   2. Right ventricular systolic function is normal. The right ventricular  size is normal. There is normal pulmonary artery systolic pressure. The  estimated right ventricular systolic pressure is 23.2 mmHg.   3. The mitral valve is normal in structure. Trivial mitral valve  regurgitation. No evidence of mitral stenosis.   4. The aortic valve is tricuspid. Aortic valve regurgitation is not  visualized. Aortic valve sclerosis/calcification is present, without any  evidence of aortic stenosis.   5. The inferior vena cava is normal in size with greater than 50%  respiratory variability, suggesting right atrial pressure of 3 mmHg.   Risk Assessment/Calculations:             Physical Exam:   VS:  BP 118/68   Pulse 70   Ht 5' 1 (1.549 m)   Wt 133 lb (60.3 kg)   LMP 11/16/2009   SpO2 97%   BMI 25.13 kg/m  , BMI Body mass index is 25.13 kg/m. GENERAL:  Well appearing HEENT: Pupils equal round and reactive, fundi not visualized, oral mucosa unremarkable NECK:  No jugular venous distention, waveform within normal limits, carotid upstroke brisk and symmetric, no bruits, no thyromegaly LUNGS:  Clear to auscultation bilaterally HEART:  RRR.  PMI not displaced or sustained,S1 and S2 within normal limits, no S3, no S4, no clicks, no rubs, no murmurs ABD:  Flat, positive bowel sounds normal in frequency in pitch, no bruits, no rebound, no guarding, no midline pulsatile mass, no hepatomegaly, no splenomegaly EXT:  2 plus pulses throughout, no edema, no cyanosis no clubbing SKIN:  No rashes no nodules NEURO:  Cranial nerves II through XII grossly intact, motor grossly intact throughout PSYCH:  Cognitively intact, oriented to person place and time   ASSESSMENT AND PLAN: .    Assessment & Plan # Essential hypertension with medication intolerance and blood  pressure fluctuations: Blood pressure fluctuates between 109/60s and 169/96. Intolerance to amlodipine  and antacids. Symptoms include fatigue, nervousness, and jittery feeling in the chest. Blood pressure increases with certain antacids. Potential for pheochromocytoma causing blood pressure fluctuations discussed. - Check blood work for catecholamines and metanephrines to rule out pheochromocytoma. - Continue amlodipine  2.5 mg as needed for blood pressure control. - Consider spironolactone as an alternative to amlodipine  if reflux symptoms persist.  # Palpitations and chest discomfort: Palpitations and chest discomfort with structurally normal heart and no artery disease. Symptoms may be related to non-cardiac causes such as reflux or medication side effects.  # Gastroesophageal reflux disease with intolerance to antacid therapy: Intolerance to multiple antacids including Pepcid, Prevacid, Prilosec, and Pepto Bismol. Symptoms include increased blood pressure, head pressure, and nervousness. Amlodipine  may contribute to reflux by decreasing lower esophageal sphincter pressure. - Discuss with gastroenterologist regarding alternative treatments for GERD. - Consider spironolactone to potentially reduce reflux symptoms.  # Thyroid  gland enlargement Enlarged thyroid  noted.  This is chronic.  Repeat TSH.  She reports unintentional weight loss.   # Unintentional  weight loss Unintentional weight loss of 30 pounds over the past couple of years. Current weight is below her comfortable level of 155 pounds.  # Musculoskeletal chest wall pain Pain in the neck and shoulder area, likely musculoskeletal in nature. Not associated with exertion or cardiac cause.     Dispo: f/u 6 months  Signed, Annabella Scarce, MD

## 2024-01-31 DIAGNOSIS — F411 Generalized anxiety disorder: Secondary | ICD-10-CM | POA: Diagnosis not present

## 2024-02-01 DIAGNOSIS — R1012 Left upper quadrant pain: Secondary | ICD-10-CM | POA: Diagnosis not present

## 2024-02-01 DIAGNOSIS — R10A2 Flank pain, left side: Secondary | ICD-10-CM | POA: Diagnosis not present

## 2024-02-06 DIAGNOSIS — N281 Cyst of kidney, acquired: Secondary | ICD-10-CM | POA: Diagnosis not present

## 2024-02-11 LAB — CATECHOLAMINES, FRACTIONATED, PLASMA
Dopamine: <10 pg/mL (ref 0.0–36.7)
Epinephrine: 41.4 pg/mL (ref 0.0–55.4)
Norepinephrine: 427 pg/mL (ref 115–524)

## 2024-02-11 LAB — METANEPHRINES, PLASMA
Metanephrine, Free: <25 pg/mL (ref 0.0–88.0)
Normetanephrine, Free: 51.4 pg/mL (ref 0.0–285.2)

## 2024-02-12 ENCOUNTER — Ambulatory Visit (HOSPITAL_BASED_OUTPATIENT_CLINIC_OR_DEPARTMENT_OTHER): Payer: Self-pay | Admitting: Family

## 2024-02-12 DIAGNOSIS — E049 Nontoxic goiter, unspecified: Secondary | ICD-10-CM | POA: Diagnosis not present

## 2024-02-12 DIAGNOSIS — R519 Headache, unspecified: Secondary | ICD-10-CM | POA: Diagnosis not present

## 2024-02-12 DIAGNOSIS — M62838 Other muscle spasm: Secondary | ICD-10-CM | POA: Diagnosis not present

## 2024-02-12 DIAGNOSIS — K219 Gastro-esophageal reflux disease without esophagitis: Secondary | ICD-10-CM | POA: Diagnosis not present

## 2024-02-12 DIAGNOSIS — F419 Anxiety disorder, unspecified: Secondary | ICD-10-CM | POA: Diagnosis not present

## 2024-02-14 DIAGNOSIS — F411 Generalized anxiety disorder: Secondary | ICD-10-CM | POA: Diagnosis not present

## 2024-02-15 DIAGNOSIS — F411 Generalized anxiety disorder: Secondary | ICD-10-CM | POA: Diagnosis not present

## 2024-02-21 DIAGNOSIS — F411 Generalized anxiety disorder: Secondary | ICD-10-CM | POA: Diagnosis not present

## 2024-02-22 DIAGNOSIS — L659 Nonscarring hair loss, unspecified: Secondary | ICD-10-CM | POA: Diagnosis not present

## 2024-02-22 DIAGNOSIS — L603 Nail dystrophy: Secondary | ICD-10-CM | POA: Diagnosis not present

## 2024-02-22 DIAGNOSIS — R634 Abnormal weight loss: Secondary | ICD-10-CM | POA: Diagnosis not present

## 2024-02-22 DIAGNOSIS — R519 Headache, unspecified: Secondary | ICD-10-CM | POA: Diagnosis not present

## 2024-02-22 DIAGNOSIS — E042 Nontoxic multinodular goiter: Secondary | ICD-10-CM | POA: Diagnosis not present

## 2024-02-22 DIAGNOSIS — L853 Xerosis cutis: Secondary | ICD-10-CM | POA: Diagnosis not present

## 2024-02-22 DIAGNOSIS — R002 Palpitations: Secondary | ICD-10-CM | POA: Diagnosis not present

## 2024-02-22 DIAGNOSIS — R251 Tremor, unspecified: Secondary | ICD-10-CM | POA: Diagnosis not present

## 2024-02-22 DIAGNOSIS — R450 Nervousness: Secondary | ICD-10-CM | POA: Diagnosis not present

## 2024-02-28 DIAGNOSIS — F411 Generalized anxiety disorder: Secondary | ICD-10-CM | POA: Diagnosis not present
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
## 2024-03-06 DIAGNOSIS — F411 Generalized anxiety disorder: Secondary | ICD-10-CM | POA: Diagnosis not present

## 2024-03-08 ENCOUNTER — Encounter: Payer: Self-pay | Admitting: Neurology

## 2024-03-08 ENCOUNTER — Ambulatory Visit: Admitting: Neurology

## 2024-03-08 VITALS — BP 117/74 | HR 67 | Ht 61.0 in | Wt 135.0 lb

## 2024-03-08 DIAGNOSIS — R200 Anesthesia of skin: Secondary | ICD-10-CM | POA: Diagnosis not present

## 2024-03-08 DIAGNOSIS — F419 Anxiety disorder, unspecified: Secondary | ICD-10-CM

## 2024-03-08 DIAGNOSIS — R519 Headache, unspecified: Secondary | ICD-10-CM | POA: Diagnosis not present

## 2024-03-08 NOTE — Patient Instructions (Signed)
 Continue to see psychiatry for your anxiety.  Follow up with me as needed. Please let me know if you have any questions or concerns in the meantime.  The physicians and staff at [facility] are committed to providing excellent care. You may receive a survey requesting feedback about your experience at our office. We strive to receive very good responses to the survey questions. If you feel that your experience would prevent you from giving the office a very good  response, please contact our office to try to remedy the situation. We may be reached at [phone]. Thank you for taking the time out of your busy day to complete the survey.  Venetia Potters, MD [facility]

## 2024-03-18 ENCOUNTER — Ambulatory Visit (HOSPITAL_BASED_OUTPATIENT_CLINIC_OR_DEPARTMENT_OTHER): Admitting: Family

## 2024-03-18 ENCOUNTER — Telehealth: Payer: Self-pay | Admitting: Cardiovascular Disease

## 2024-03-18 ENCOUNTER — Encounter (HOSPITAL_BASED_OUTPATIENT_CLINIC_OR_DEPARTMENT_OTHER): Payer: Self-pay | Admitting: Family

## 2024-03-18 VITALS — BP 114/64 | HR 60 | Ht 61.0 in | Wt 138.0 lb

## 2024-03-18 DIAGNOSIS — I10 Essential (primary) hypertension: Secondary | ICD-10-CM | POA: Diagnosis not present

## 2024-03-18 DIAGNOSIS — R0789 Other chest pain: Secondary | ICD-10-CM

## 2024-03-18 DIAGNOSIS — R001 Bradycardia, unspecified: Secondary | ICD-10-CM | POA: Diagnosis not present

## 2024-03-18 NOTE — Patient Instructions (Addendum)
 Medication Instructions:  Continue your current medications  *If you need a refill on your cardiac medications before your next appointment, please call your pharmacy*   Testing/Procedures:  Your physician has recommended that you wear a Zio monitor.  Please call our office [phone]) or mychart us  if you wish to pursue heart monitoring device and we will mail this with the sensitive electrode to your mailing address on file.   This monitor is a medical device that records the heart's electrical activity. Doctors most often use these monitors to diagnose arrhythmias. Arrhythmias are problems with the speed or rhythm of the heartbeat. The monitor is a small device applied to your chest. You can wear one while you do your normal daily activities. While wearing this monitor if you have any symptoms to push the button and record what you felt. Once you have worn this monitor for the period of time provider prescribed for 7 days if possible you will return the monitor device in the postage paid box. Once it is returned they will download the data collected and provide us  with a report which the provider will then review and we will call you with those results. Important tips:  Avoid showering during the first 24 hours of wearing the monitor. Avoid excessive sweating to help maximize wear time. Do not submerge the device, no hot tubs, and no swimming pools. Keep any lotions or oils away from the patch. After 24 hours you may shower with the patch on. Take brief showers with your back facing the shower head.  Do not remove patch once it has been placed because that will interrupt data and decrease adhesive wear time. Push the button when you have any symptoms and write down what you were feeling. Once you have completed wearing your monitor, remove and place into box which has postage paid and place in your outgoing mailbox.  If for some reason you have misplaced your box then call our office and we  can provide another box and/or mail it off for you.  Follow-Up:   Your next appointment:   3 month(s)  Provider:   Annabella Scarce, MD, Rosaline Bane, NP, or Reche Finder, NP      Other Instructions  To prevent palpitations: Make sure you are adequately hydrated.  Avoid and/or limit caffeine containing beverages like soda or tea. Exercise regularly.  Manage stress well. Some over the counter medications can cause palpitations such as Benadryl , AdvilPM, TylenolPM. Regular Advil or Tylenol  do not cause palpitations.    For mychart support questions/help call [phone]

## 2024-03-18 NOTE — Telephone Encounter (Signed)
 Will address at OV.   Maximillian Habibi S Declin Rajan, NP

## 2024-03-18 NOTE — Progress Notes (Signed)
 Cardiology Office Note   Date:  03/18/2024  ID:  Bianca [name], DOB 11/13/1955, MRN [id number] PCP: Seabron Lenis, MD  [facility] Providers Cardiologist:  Lonni Cash, MD     History of Present Illness Bianca [name] is a 68 y.o. 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 In regard to her anxiety, follows with Dr. Celestia of psychiatry.   Prior LHC 2012 and 2014 normal coronary arteries. Echo 04/2010 unremarkable. Monitor 07/2020 NSR and PAC. Almodipine previously transitioned to Losartan  as she felt she had been on Amlodipine  too long, but felt poorly and returned to Amlodipine . Calcium  score 06/25/21 of 0. Repeat monitor 07/2022 due to dizziness and palpitations with short runs SVT up to 5 beats. Echo LVEF 60-65%, no significant valvular abnormalities. She had brain MRI as well which was unremarkable.   Transferred care from Dr. Cash to Dr. Raford on 01/30/24. Noted in advertently losing 30 lbs over the last couple years. Difficulties with IBS and acid reflux. Also difficulty finding well tolerated medication for her anxiety. Repeat TSH due to enlarged thyroid  gland was unremarkable.  Plasma catecholamines, metanephrines were unremarkable.   Presents today after contacting the office noting intermittent chest pain, SOB, fatigue day prior. BP 134/82 and HR 53-57bpm.   Reports yesterday had intermittent chest pain in her epigastric region and feeling very drained. Her heart rate was 53 bpm which concerned her. Last night endorses feeling really bad and lightheaded. The chest pain is described as pressure described as  weight which occurred at rest. Reports activity made her feel fatigued but no worsened chest pain with activity. Low heart rate associated with minor lightheadedness. She was concerned as she read online that a normal heart rate was 70 bpm, discussed permissible HR range at rest of 55-100bpm and  that we treat most often based on symptoms rather than specific HR reading. No recent lifting. She aims for 60 oz of fluid per day. TSH and T4 with Dr. Faythe of encocrinology in November unremarkable. She is bowling for exercise  Previous antihypertensive  Amlodipine  - did not tolerate  ROS: Please see the history of present illness.    All other systems reviewed and are negative.   Studies Reviewed EKG Interpretation Date/Time:  Monday March 18 2024 14:44:57 EST Ventricular Rate:  60 PR Interval:  142 QRS Duration:  82 QT Interval:  412 QTC Calculation: 412 R Axis:   32  Text Interpretation: Normal sinus rhythm Normal ECG Confirmed by Vannie Mora ([id number]) on 03/18/2024 2:56:42 PM    Cardiac Studies & Procedures   ______________________________________________________________________________________________     ECHOCARDIOGRAM  ECHOCARDIOGRAM COMPLETE 11/01/2022  Narrative ECHOCARDIOGRAM REPORT    Patient Name:   Bianca [name] Date of Exam: 11/01/2022 Medical Rec #:  [id number]       Height:       61.0 in Accession #:    [id number]      Weight:       150.5 lb Date of Birth:  1955-11-08        BSA:          1.674 m Patient Age:    67 years        BP:           114/72 mmHg Patient Gender: F               HR:           58 bpm.  Exam Location:  [facility]  Procedure: 2D Echo, Cardiac Doppler, Color Doppler and Strain Analysis  Indications:    R42 Lightheadedness  History:        Patient has prior history of Echocardiogram examinations, most recent 05/12/2010. CAD, Signs/Symptoms:Dizziness/Lightheadedness; Risk Factors:Hypertension.  Sonographer:    Elsie Bohr RDCS Referring Phys: [id number] CHRISTOPHER D MCALHANY  IMPRESSIONS   1. Left ventricular ejection fraction, by estimation, is 60 to 65%. The left ventricle has normal function. The left ventricle has no regional wall motion abnormalities. There is mild left ventricular hypertrophy of the basal-septal  segment. Left ventricular diastolic parameters were normal. The average left ventricular global longitudinal strain is 21.3 %. The global longitudinal strain is normal. 2. Right ventricular systolic function is normal. The right ventricular size is normal. There is normal pulmonary artery systolic pressure. The estimated right ventricular systolic pressure is 23.2 mmHg. 3. The mitral valve is normal in structure. Trivial mitral valve regurgitation. No evidence of mitral stenosis. 4. The aortic valve is tricuspid. Aortic valve regurgitation is not visualized. Aortic valve sclerosis/calcification is present, without any evidence of aortic stenosis. 5. The inferior vena cava is normal in size with greater than 50% respiratory variability, suggesting right atrial pressure of 3 mmHg.  FINDINGS Left Ventricle: Left ventricular ejection fraction, by estimation, is 60 to 65%. The left ventricle has normal function. The left ventricle has no regional wall motion abnormalities. The average left ventricular global longitudinal strain is 21.3 %. The global longitudinal strain is normal. The left ventricular internal cavity size was normal in size. There is mild left ventricular hypertrophy of the basal-septal segment. Left ventricular diastolic parameters were normal. Normal left ventricular filling pressure.  Right Ventricle: The right ventricular size is normal. No increase in right ventricular wall thickness. Right ventricular systolic function is normal. There is normal pulmonary artery systolic pressure. The tricuspid regurgitant velocity is 2.25 m/s, and with an assumed right atrial pressure of 3 mmHg, the estimated right ventricular systolic pressure is 23.2 mmHg.  Left Atrium: Left atrial size was normal in size.  Right Atrium: Right atrial size was normal in size.  Pericardium: There is no evidence of pericardial effusion.  Mitral Valve: The mitral valve is normal in structure. Trivial mitral valve  regurgitation. No evidence of mitral valve stenosis.  Tricuspid Valve: The tricuspid valve is normal in structure. Tricuspid valve regurgitation is trivial. No evidence of tricuspid stenosis.  Aortic Valve: The aortic valve is tricuspid. Aortic valve regurgitation is not visualized. Aortic valve sclerosis/calcification is present, without any evidence of aortic stenosis.  Pulmonic Valve: The pulmonic valve was normal in structure. Pulmonic valve regurgitation is not visualized. No evidence of pulmonic stenosis.  Aorta: The aortic root is normal in size and structure.  Venous: The inferior vena cava is normal in size with greater than 50% respiratory variability, suggesting right atrial pressure of 3 mmHg.  IAS/Shunts: No atrial level shunt detected by color flow Doppler.   LEFT VENTRICLE PLAX 2D LVIDd:         3.60 cm   Diastology LVIDs:         2.10 cm   LV e' medial:    9.03 cm/s LV PW:         1.00 cm   LV E/e' medial:  7.9 LV IVS:        1.20 cm   LV e' lateral:   12.40 cm/s LVOT diam:     1.90 cm   LV E/e' lateral: 5.7  LV SV:         69 LV SV Index:   41        2D Longitudinal Strain LVOT Area:     2.84 cm  2D Strain GLS (A2C):   24.0 % 2D Strain GLS (A3C):   18.4 % 2D Strain GLS (A4C):   21.3 % 2D Strain GLS Avg:     21.3 %  RIGHT VENTRICLE             IVC RV S prime:     14.10 cm/s  IVC diam: 0.90 cm TAPSE (M-mode): 2.1 cm RVSP:           23.2 mmHg  LEFT ATRIUM             Index        RIGHT ATRIUM           Index LA diam:        3.90 cm 2.33 cm/m   RA Pressure: 3.00 mmHg LA Vol (A2C):   58.1 ml 34.71 ml/m  RA Area:     12.00 cm LA Vol (A4C):   39.8 ml 23.78 ml/m  RA Volume:   29.40 ml  17.57 ml/m LA Biplane Vol: 48.2 ml 28.80 ml/m AORTIC VALVE LVOT Vmax:   113.00 cm/s LVOT Vmean:  71.800 cm/s LVOT VTI:    0.243 m  AORTA Ao Root diam: 2.80 cm Ao Asc diam:  2.70 cm  MITRAL VALVE               TRICUSPID VALVE MV Area (PHT): 2.77 cm    TR Peak grad:   20.2  mmHg MV Decel Time: 274 msec    TR Vmax:        225.00 cm/s MV E velocity: 71.30 cm/s  Estimated RAP:  3.00 mmHg MV A velocity: 88.60 cm/s  RVSP:           23.2 mmHg MV E/A ratio:  0.80 SHUNTS Systemic VTI:  0.24 m Systemic Diam: 1.90 cm  Wilbert Bihari MD Electronically signed by Wilbert Bihari MD Signature Date/Time: 11/01/2022/3:30:02 PM    Final    MONITORS  LONG TERM MONITOR (3-14 DAYS) 08/05/2022  Narrative Patch Wear Time:  4 days and 0 hours (2024-04-10T15:14:41-0400 to 2024-04-14T16:02:33-0400)  Sinus rhythm. (min HR of 46 bpm, max HR of 135 bpm, and avg HR of 65 bpm) 2 Supraventricular Tachycardia runs occurred, the longest lasting 5 beats with an avg rate of 99 bpm. Supraventricular Tachycardia was detected within +/- 45 seconds of symptomatic patient event(s). Rare premature atrial contractions (<1.0%) Rare premature ventricular contractions (<1.0%), and no VE Couplets or VE Triplets were present.       ______________________________________________________________________________________________      Risk Assessment/Calculations           Physical Exam VS:  BP 114/64 (BP Location: Left Arm, Patient Position: Sitting, Cuff Size: Normal)   Pulse 60   Ht 5' 1 (1.549 m)   Wt 138 lb (62.6 kg)   LMP 11/16/2009   SpO2 98%   BMI 26.07 kg/m        Wt Readings from Last 3 Encounters:  03/18/24 138 lb (62.6 kg)  03/08/24 135 lb (61.2 kg)  01/30/24 133 lb (60.3 kg)    GEN: Well nourished, well developed in no acute distress NECK: No JVD; No carotid bruits CARDIAC: RRR, no murmurs, rubs, gallops RESPIRATORY:  Clear to auscultation without rales, wheezing or rhonchi  ABDOMEN: Soft, non-tender, non-distended EXTREMITIES:  No edema; No deformity   ASSESSMENT AND PLAN  Atypical chest pain - likely related to GERD as at rest and in epigastric region. 06/2021 calcium  score 0. EKG today no acute St/T wave changes. No indication for ischemic eval.   Sinus  bradycardia - reports home HR as low as 53 bpm with lightheadedness. No near syncope, syncope. Echo 10/2022 normal LVEF, no significant valvular abnormalities - no indication to repeat. EKG today SR 60 bpm with no acute changes. She is not on AV nodal blocking agent. Labs last month with endocrinology normal TSH and T4. Reassurance provided regarding normal HR range at rest 55-100bpm. She was offered ZIO monitor be placed in clinic but reported concerns about potential skin irritation. She instead will contact us  if bradycardia and lightheadedness recurs and monitor can be mailed to her home.   HTN - BP well controlled. Continue current antihypertensive regimen Amlodipine  2.5mg  daily. Per Dr. Raford previous note if alternate agent needed consider Spironolactone.      Dispo: follow up in 3 months  Signed, Reche GORMAN Finder, NP

## 2024-03-18 NOTE — Telephone Encounter (Signed)
 Spoke with patient who stated she started having intermittent chest pains/shortness of breath/fatigue yesterday.  Fatigue is worse when up moving around Not currently having chest pain  Blood pressure higher for her, running 134/82 HR 53-57  Scheduled appointment today with Reche ORN NP  Advised to go to ED if chest pain returns and does not resolve

## 2024-03-18 NOTE — Telephone Encounter (Signed)
)   What is your heart rate? 53-57   2) Do you have a log of your heart rate readings (document readings) no   3) Do you have any other symptoms? Tired  1. Are you having CP right now? Yes- discomfort      2. Are you experiencing any other symptoms (ex. SOB, nausea, vomiting, sweating)? SOB at times     3. Is your CP continuous or coming and going? Comes and goes     4. Have you taken Nitroglycerin ? no     5. How long have you been experiencing CP? Last night     6. If NO CP at time of call then end call with telling Pt to call back or call 911 if Chest pain returns prior to return call from triage team.  Call transferred.

## 2024-05-14 ENCOUNTER — Ambulatory Visit: Payer: Medicare PPO | Admitting: Allergy and Immunology

## 2024-05-16 ENCOUNTER — Encounter: Payer: Self-pay | Admitting: *Deleted

## 2024-05-16 ENCOUNTER — Encounter (HOSPITAL_BASED_OUTPATIENT_CLINIC_OR_DEPARTMENT_OTHER): Payer: Self-pay | Admitting: Cardiology

## 2024-05-16 ENCOUNTER — Ambulatory Visit (HOSPITAL_BASED_OUTPATIENT_CLINIC_OR_DEPARTMENT_OTHER): Admitting: Cardiology

## 2024-05-16 ENCOUNTER — Ambulatory Visit: Attending: Cardiology

## 2024-05-16 ENCOUNTER — Telehealth: Payer: Self-pay | Admitting: Cardiovascular Disease

## 2024-05-16 VITALS — BP 100/62 | HR 62 | Ht 61.0 in | Wt 136.0 lb

## 2024-05-16 DIAGNOSIS — R002 Palpitations: Secondary | ICD-10-CM

## 2024-05-16 DIAGNOSIS — R0789 Other chest pain: Secondary | ICD-10-CM | POA: Diagnosis not present

## 2024-05-16 DIAGNOSIS — R072 Precordial pain: Secondary | ICD-10-CM

## 2024-05-16 DIAGNOSIS — R42 Dizziness and giddiness: Secondary | ICD-10-CM | POA: Diagnosis not present

## 2024-05-16 DIAGNOSIS — R0602 Shortness of breath: Secondary | ICD-10-CM | POA: Diagnosis not present

## 2024-05-16 NOTE — Progress Notes (Signed)
 Patient enrolled for [facility] [employer] to ship a long term holter monitor with sensitive skin Hydrocolloid strips.

## 2024-05-16 NOTE — Patient Instructions (Addendum)
 Medication Instructions:  No changes *If you need a refill on your cardiac medications before your next appointment, please call your pharmacy*  Lab Work: Have blood work before the CT scan to check kidney function (bmet)  If you have labs (blood work) drawn today and your tests are completely normal, you will receive your results only by: MyChart Message (if you have MyChart) OR A paper copy in the mail If you have any lab test that is abnormal or we need to change your treatment, we will call you to review the results.  Testing/Procedures: Coronary CT Angiogram, see instructions below  14 day heart monitor - will be mailed to your home to apply, wear and mail back after 2 weeks of wear.  Follow-Up: As planned with Dr. Raford    Your cardiac CT will be scheduled at one of the below locations:   [facility] [address]  [place], [place] [address]   [facility] at [address]  please enter the parking lot using the [address] entrance and use the FREE valet service at the patient drop-off area. Enter the building and check-in with registration on the main floor.  Please follow these instructions carefully (unless otherwise directed):  An IV will be required for this test and Nitroglycerin  will be given.   On the Night Before the Test: Be sure to Drink plenty of water. Do not consume any caffeinated/decaffeinated beverages or chocolate 12 hours prior to your test. Do not take any antihistamines 12 hours prior to your test.  On the Day of the Test: Drink plenty of water until 1 hour prior to the test. Do not eat any food 1 hour prior to test. You may take your regular medications prior to the test.  If you take Furosemide /Hydrochlorothiazide/Spironolactone/Chlorthalidone, please HOLD on the morning of the test. Patients who wear a continuous glucose monitor MUST remove the device prior to scanning. FEMALES- please  wear underwire-free bra if available, avoid dresses & tight clothing      After the Test: Drink plenty of water. After receiving IV contrast, you may experience a mild flushed feeling. This is normal. On occasion, you may experience a mild rash up to 24 hours after the test. This is not dangerous. If this occurs, you can take Benadryl  25 mg, Zyrtec, Claritin , or Allegra and increase your fluid intake. (Patients taking Tikosyn should avoid Benadryl , and may take Zyrtec, Claritin , or Allegra) If you experience trouble breathing, this can be serious. If it is severe call 911 IMMEDIATELY. If it is mild, please call our office.  We will call to schedule your test 2-4 weeks out understanding that some insurance companies will need an authorization prior to the service being performed.   For more information and frequently asked questions, please visit our website : [email or web address]  For non-scheduling related questions, please contact the cardiac imaging nurse navigator should you have any questions/concerns: Cardiac Imaging Nurse Navigators Direct Office Dial: [phone]   For scheduling needs, including cancellations and rescheduling, please call Brittany, [phone].  For billing questions, please call [phone].

## 2024-05-16 NOTE — Progress Notes (Signed)
 " Cardiology Office Note:  .   Date:  05/16/2024  ID:  Bianca [name], DOB Nov 04, 1955, MRN [id number] PCP: Seabron Lenis, MD  [facility] Providers Cardiologist:  Annabella Scarce, MD     History of Present Illness: Bianca   TOULA [name] is a 69 y.o. female Discussed the use of AI scribe   History of Present Illness Bianca [name] is a 69 year old female who presents with chest pain and shortness of breath.  Chest pain and dyspnea - Intermittent mid-sternal chest discomfort - Shortness of breath, particularly with exertion - Chest pain does not radiate - No associated nausea, vomiting, diaphoresis, or palpitations  Weakness and lightheadedness - Waves of weakness - Occasional lightheadedness - Feels 'drained' at times  Bradycardia and blood pressure variability - Pulse rate sometimes in the fifties, even with activity or after rest - Blood pressure recorded as 124/75 mmHg yesterday - Blood pressure around 100 mmHg this morning, perceived as 'a little bit on the low' side  Cardiac symptom history and prior evaluation - History of chest pain, bradycardia, lightheadedness, and palpitations over several years - Multiple prior consultations for these symptoms - Echocardiogram on November 01, 2022, with normal findings and ejection fraction of 65% - CT coronary calcium  score in 2023 was zero  Recent laboratory findings - LDL 97 mg/dL in 7974 - Creatinine 0.9 mg/dL - Hemoglobin J8r 4.7% - Hemoglobin 13.9 g/dL in December - Thyroid -stimulating hormone 0.6, all within normal limits     Studies Reviewed: Bianca   EKG Interpretation Date/Time:  Thursday May 16 2024 11:34:47 EST Ventricular Rate:  62 PR Interval:  154 QRS Duration:  82 QT Interval:  420 QTC Calculation: 426 R Axis:   52  Text Interpretation: Normal sinus rhythm Non-specific ST-t changes (subtle depression lateral leads) When compared with ECG of 18-Mar-2024 14:44, No significant change was found Confirmed  by Jeffrie Anes ([id number]) on 05/16/2024 11:46:53 AM    Results Labs LDL (2025): 97 Creatinine (2025): 0.9 Hemoglobin A1c (2025): 5.2 Hemoglobin (03/2024): 13.9 TSH (02/2024): 0.6  Radiology CT coronary calcium  score (2023): Coronary artery calcium  score zero  Diagnostic Echocardiogram (11/01/2022): Normal, ejection fraction 65% Risk Assessment/Calculations:            Physical Exam:   VS:  BP 100/62 (BP Location: Left Arm, Patient Position: Sitting, Cuff Size: Normal)   Pulse 62   Ht 5' 1 (1.549 m)   Wt 136 lb (61.7 kg)   LMP 11/16/2009   SpO2 97%   BMI 25.70 kg/m    Wt Readings from Last 3 Encounters:  05/16/24 136 lb (61.7 kg)  03/18/24 138 lb (62.6 kg)  03/08/24 135 lb (61.2 kg)    GEN: Well nourished, well developed in no acute distress NECK: No JVD; No carotid bruits CARDIAC: RRR, no murmurs, no rubs, no gallops RESPIRATORY:  Clear to auscultation without rales, wheezing or rhonchi  ABDOMEN: Soft, non-tender, non-distended EXTREMITIES:  No edema; No deformity   ASSESSMENT AND PLAN: .    Assessment and Plan Assessment & Plan Evaluation of chest pain and exertional dyspnea Intermittent mid-sternal chest discomfort with exertional dyspnea. No radiation, nausea, vomiting, diaphoresis, or palpitations. Previous echocardiogram and CT coronary calcium  score were normal. Differential includes coronary artery disease. - Ordered coronary CT scan to evaluate for stenosis or blockage in coronary arteries.  Assessment of bradycardia and palpitations Bradycardia with heart rate in the fifties, sometimes as low as thirty. Palpitations present. Previous echocardiogram was normal. Differential  includes atrial fibrillation or other arrhythmias. - Apply Zio patch for two-week heart monitoring to assess for arrhythmias or dangerously low heart rates.  Assessment of lightheadedness Intermittent lightheadedness associated with chest discomfort and exertional dyspnea. No nausea,  vomiting, or diaphoresis. Previous thyroid  function tests were normal.         Dispo: Will follow-up with studies  Signed, Oneil Parchment, MD  "

## 2024-05-16 NOTE — Progress Notes (Unsigned)
 [employer] mailed

## 2024-05-16 NOTE — Telephone Encounter (Signed)
 Pt calling in with complaints of sob and chest discomfort since yesterday.  Pt states symptoms started yesterday, with mid-sternal chest discomfort and sob with exertion.  She states chest pain stays in the middle of her chest, with no radiation.  She states the chest discomfort is not constant, only intermittent in nature.  She states she gets sob mostly with exertion.   Pt states she has episodes of feeling lightheaded, but that only last for about 30 secs.  She states when the lightheadedness begins, she feels a wave of weakness.  She is unable to tell me if certain activities exacerbate her symptoms.  She denies orthopnea, N/V, diaphoresis, palpitations, pre-syncopal or syncopal episodes. She denies any swelling to her lower extremities.   She does monitor her BP/HR at home daily.  Yesterdays readings were 124/75 HR-61.  This mornings readings were 125/75 HR-63.  Pt is asking to be seen today for her complaints.   Scheduled the pt with our DOD Dr. Jeffrie for today at 1140.  She is aware to arrive 10-15 mins prior to this visit.   Advised the pt in the interim between now and her appt, if symptoms return/worsen/persist, please refer to the ER at that time.   Pt verbalized understanding and agrees with this plan.

## 2024-05-16 NOTE — Telephone Encounter (Signed)
 Pt c/o Shortness Of Breath: STAT if SOB developed within the last 24 hours or pt is noticeably SOB on the phone  1. Are you currently SOB (can you hear that pt is SOB on the phone)? Yes   2. How long have you been experiencing SOB? Since last night   3. Are you SOB when sitting or when up moving around? Worse when moving around   4. Are you currently experiencing any other symptoms? Chest discomfort

## 2024-06-20 ENCOUNTER — Ambulatory Visit (HOSPITAL_BASED_OUTPATIENT_CLINIC_OR_DEPARTMENT_OTHER): Admitting: Cardiovascular Disease
